# Patient Record
Sex: Female | Born: 1970
Health system: Southern US, Community
[De-identification: ages and names within clinical notes are randomized; demographics above are authoritative.]

## PROBLEM LIST (undated history)

## (undated) DIAGNOSIS — F419 Anxiety disorder, unspecified: Secondary | ICD-10-CM

## (undated) DIAGNOSIS — F329 Major depressive disorder, single episode, unspecified: Secondary | ICD-10-CM

## (undated) DIAGNOSIS — M170 Bilateral primary osteoarthritis of knee: Secondary | ICD-10-CM

## (undated) DIAGNOSIS — F32A Depression, unspecified: Secondary | ICD-10-CM

## (undated) DIAGNOSIS — G43909 Migraine, unspecified, not intractable, without status migrainosus: Secondary | ICD-10-CM

## (undated) DIAGNOSIS — G44009 Cluster headache syndrome, unspecified, not intractable: Secondary | ICD-10-CM

## (undated) HISTORY — DX: Migraine, unspecified, not intractable, without status migrainosus: G43.909

## (undated) HISTORY — DX: Major depressive disorder, single episode, unspecified: F32.9

## (undated) HISTORY — DX: Bilateral primary osteoarthritis of knee: M17.0

## (undated) HISTORY — DX: Depression, unspecified: F32.A

## (undated) HISTORY — DX: Anxiety disorder, unspecified: F41.9

## (undated) HISTORY — DX: Cluster headache syndrome, unspecified, not intractable: G44.009

## (undated) HISTORY — PX: LAPAROSCOPIC GASTRIC SLEEVE RESECTION: SHX5895

---

## 1997-04-22 ENCOUNTER — Other Ambulatory Visit: Admission: RE | Admit: 1997-04-22 | Discharge: 1997-04-22 | Payer: Self-pay | Admitting: Obstetrics and Gynecology

## 1998-05-02 ENCOUNTER — Other Ambulatory Visit: Admission: RE | Admit: 1998-05-02 | Discharge: 1998-05-02 | Payer: Self-pay | Admitting: Obstetrics and Gynecology

## 1999-06-05 ENCOUNTER — Other Ambulatory Visit: Admission: RE | Admit: 1999-06-05 | Discharge: 1999-06-05 | Payer: Self-pay | Admitting: Obstetrics and Gynecology

## 2000-07-02 ENCOUNTER — Other Ambulatory Visit: Admission: RE | Admit: 2000-07-02 | Discharge: 2000-07-02 | Payer: Self-pay | Admitting: Obstetrics and Gynecology

## 2001-04-02 ENCOUNTER — Ambulatory Visit (HOSPITAL_COMMUNITY): Admission: RE | Admit: 2001-04-02 | Discharge: 2001-04-02 | Payer: Self-pay

## 2001-04-02 ENCOUNTER — Encounter: Payer: Self-pay | Admitting: Obstetrics and Gynecology

## 2001-08-23 ENCOUNTER — Inpatient Hospital Stay (HOSPITAL_COMMUNITY): Admission: AD | Admit: 2001-08-23 | Discharge: 2001-08-23 | Payer: Self-pay | Admitting: Obstetrics and Gynecology

## 2001-08-25 ENCOUNTER — Inpatient Hospital Stay (HOSPITAL_COMMUNITY): Admission: AD | Admit: 2001-08-25 | Discharge: 2001-08-29 | Payer: Self-pay | Admitting: Obstetrics and Gynecology

## 2001-08-25 ENCOUNTER — Encounter (INDEPENDENT_AMBULATORY_CARE_PROVIDER_SITE_OTHER): Payer: Self-pay | Admitting: Specialist

## 2001-08-30 ENCOUNTER — Encounter: Admission: RE | Admit: 2001-08-30 | Discharge: 2001-09-29 | Payer: Self-pay | Admitting: Obstetrics and Gynecology

## 2001-09-30 ENCOUNTER — Encounter: Admission: RE | Admit: 2001-09-30 | Discharge: 2001-10-30 | Payer: Self-pay | Admitting: Obstetrics and Gynecology

## 2001-10-10 ENCOUNTER — Other Ambulatory Visit: Admission: RE | Admit: 2001-10-10 | Discharge: 2001-10-10 | Payer: Self-pay | Admitting: Obstetrics and Gynecology

## 2002-08-12 ENCOUNTER — Encounter: Payer: Self-pay | Admitting: Family Medicine

## 2002-08-12 ENCOUNTER — Encounter: Admission: RE | Admit: 2002-08-12 | Discharge: 2002-08-12 | Payer: Self-pay | Admitting: Family Medicine

## 2002-11-11 ENCOUNTER — Other Ambulatory Visit: Admission: RE | Admit: 2002-11-11 | Discharge: 2002-11-11 | Payer: Self-pay | Admitting: Obstetrics and Gynecology

## 2003-11-16 ENCOUNTER — Other Ambulatory Visit: Admission: RE | Admit: 2003-11-16 | Discharge: 2003-11-16 | Payer: Self-pay | Admitting: Obstetrics and Gynecology

## 2004-12-18 ENCOUNTER — Other Ambulatory Visit: Admission: RE | Admit: 2004-12-18 | Discharge: 2004-12-18 | Payer: Self-pay | Admitting: Obstetrics and Gynecology

## 2005-08-01 ENCOUNTER — Emergency Department (HOSPITAL_COMMUNITY): Admission: EM | Admit: 2005-08-01 | Discharge: 2005-08-01 | Payer: Self-pay | Admitting: Family Medicine

## 2006-05-16 ENCOUNTER — Encounter (INDEPENDENT_AMBULATORY_CARE_PROVIDER_SITE_OTHER): Payer: Self-pay | Admitting: Specialist

## 2006-05-16 ENCOUNTER — Inpatient Hospital Stay (HOSPITAL_COMMUNITY): Admission: AD | Admit: 2006-05-16 | Discharge: 2006-05-19 | Payer: Self-pay | Admitting: Obstetrics and Gynecology

## 2006-05-20 ENCOUNTER — Encounter: Admission: RE | Admit: 2006-05-20 | Discharge: 2006-06-14 | Payer: Self-pay | Admitting: Obstetrics and Gynecology

## 2009-04-22 ENCOUNTER — Encounter: Admission: RE | Admit: 2009-04-22 | Discharge: 2009-04-22 | Payer: Self-pay | Admitting: Family Medicine

## 2010-05-30 ENCOUNTER — Encounter: Payer: Self-pay | Admitting: Oncology

## 2010-06-16 NOTE — Op Note (Signed)
NAMELORYN, Laura Brown               ACCOUNT NO.:  [id number]   MEDICAL RECORD NO.:  [id number]          PATIENT TYPE:  AMB   LOCATION:  SDC                           FACILITY:  [facility]   PHYSICIAN:  Michelle L. Grewal, M.D.DATE OF BIRTH:  [birth year]   DATE OF PROCEDURE:  05/16/2006  DATE OF DISCHARGE:                               OPERATIVE REPORT   PREOPERATIVE DIAGNOSES:  1. Intrauterine pregnancy at term.  2. Previous C-section.  3. Desires permanent sterilization.   POSTOPERATIVE DIAGNOSES:  1. Intrauterine pregnancy at term.  2. Previous C-section.  3. Desires permanent sterilization.   PROCEDURE:  1. Repeat low transverse cesarean section.  2. Modified Pomeroy bilateral tubal ligation.   SURGEON:  Dr. Vincente Poli.   ASSISTANT:  Dr. Arelia Sneddon.   ANESTHESIA:  Spinal.   SPECIMENS:  Female in cephalic presentation, Apgars 9 at one minute and  9 at five minutes.   ESTIMATED BLOOD LOSS:  500 mL.   COMPLICATIONS:  None.   PROCEDURE:  Patient was taken to the operating room.  Her spinal was  placed by Dr. Arby Barrette.  She was then prepped and draped.  A Foley  catheter was inserted.  The patient was tested with Allis, and she was  none.  A low transverse incision was made in the area of the previous C-  section, carried down to fascia.  Fascia scored in the midline and  extended laterally.  The rectus muscles were separated in the midline.  The peritoneum was entered bluntly.  Peritoneal incision was then  stretched.  The bladder blade was inserted.  The lower uterine segment  was identified, and the bladder flap was created sharply and then  digitally.  The bladder blade was readjusted.  A low transverse incision  was made in the uterus.  The uterus was entered using hemostat.  Amniotic fluid was clear.  The baby was cephalic presentation and was  delivered easily with one gentle pull of the vacuum and no pop-off.  The  baby was a female infant with Apgars of 9 at one minute and 9  at five  minutes.  Cord was clamped.  The baby was handed to the waiting neonatal  team and subsequently taken to newborn nursery.  Cord pH was obtained.  Cord blood was obtained.  The placenta was manually removed and noted be  normal, intact with three-vessel cord.  The uterus was exteriorized and  cleared of all clots and debris.  The adnexa were normal.  The uterine  incision was closed using one layer of 0 chromic continuous running  locked stitch.  Antibiotics and Pitocin were given.  The uterus was very  firm.  At this point, we performed a modified Pomeroy bilateral tubal  ligation in standard fashion.  Bilateral fimbriated ends were noted.  Both tubal segments were sent separately for pathology confirmation.  The uterus was returned to the abdomen.  Irrigation was performed.  Hemostasis was excellent.  The peritoneum was reapproximated using 0  Vicryl, and the fascia was closed using a 0 Vicryl running stitch.  After irrigation of the subcutaneous layer,  the subcutaneous layer was  closed using interrupts using plain gut.  The skin was closed with  staples.  All sponge, lap and instrument counts were correct x2      Michelle L. Vincente Poli, M.D.  Electronically Signed     MLG/MEDQ  D:  05/16/2006  T:  05/16/2006  Job:  [id number]

## 2010-06-16 NOTE — Op Note (Signed)
NAME:  Laura Brown, Laura Brown                         ACCOUNT NO.:  [id number]   MEDICAL RECORD NO.:  [id number]                   PATIENT TYPE:  INP   LOCATION:  9116                                 FACILITY:  [facility]   PHYSICIAN:  Juluis Mire, M.D.                DATE OF BIRTH:  1970/08/06   DATE OF PROCEDURE:  DATE OF DISCHARGE:                                 OPERATIVE REPORT   PREOPERATIVE DIAGNOSES:  1. Intrauterine pregnancy at 40+ weeks.  2. Induction of labor.  3. Nonreassuring fetal heart rate.   POSTOPERATIVE DIAGNOSES:  1. Intrauterine pregnancy at 40+ weeks.  2. Induction of labor.  3. Nonreassuring fetal heart rate.  4. Finding of meconium stained fluid.  5. Nuchal cord x1.   OPERATIVE PROCEDURE:  Low transverse cesarean section.   SURGEON:  Juluis Mire, M.D.   ANESTHESIA:  Epidural.   ESTIMATED BLOOD LOSS:  800 cc.   PACKS AND DRAINS:  None.   COMPLICATIONS:  None.   INDICATIONS FOR PROCEDURE:  The patient is a 40 year old, primigravida,  married, white female who presents at 40+ weeks for Cytotec, ripening of  cervix and induction of labor.  Through the evening after the Cytotec was  applied, we began noticing repetitive decelerations down at times into the  60s, some with slow recovery.  In between, would be a fairly reactive  tracing.  This pattern continued despite position changes, hydration and  oxygen.  The cervix remained long and fingertip.  The concern was that with  progressive induction of labor, the fetal tracing would worsen.  After  discussions of options including the possibility of proceeding with  induction versus primary cesarean section, the decision was to proceed with  primary cesarean section in view of the preventing any potential fetal  compromise.  The risks were discussed including the risk of anesthesia, the  risk of infection, the risk of hemorrhage could necessitate transfusional  risk for AIDS or hepatitis, the risk of injury to  attached organs including  bladder, bowel and ureters.  She could require further exploratory surgery,  risk of deep venous thrombosis and pulmonary embolus.   DESCRIPTION OF PROCEDURE:  The patient was taken to the OR and placed in the  supine position with lateral tilt.  After epidural anesthesia was obtained,  the abdomen was prepped with Betadine and draped in a sterile field.  A low  transverse skin incision was made with the knife and carried through the  subcutaneous tissue.  The fascia was then sharply incised laterally.  The  fascia was taken off the muscle superiorly and inferiorly.  Rectus muscle  was resected at the midline.  The peritoneum was entered and incision of the  peritoneum extended superiorly and inferiorly.  Meconium stained fluid was  noted.  The infant was delivered with elevation of the head.  We  subsequently suctioned with DeLee was well as  bulb.  The infant was passed  off to awaiting pediatric team.  The infant was a viable female.  He weighed 7  pounds 13 ounces, Apgar's 8 and 9 and pH was 7.28.  There was a nuchal cord  x1.  Placenta was delivered manually and it was noted to be meconium stained  and sent for pathological review.  Uterus was then closed in interlocking  sutures with two-layer closure technique.  Hemostasis was excellent.  The  uterus was exteriorized.  Tubes and ovaries unremarkable.  There was a  serosal fibroid in the fundus that measured approximately 2-3 cm in greatest  dimension.  At this point and time, we thoroughly irrigated the pelvis.  Hemostasis was excellent.  Urine output remained cleared and adequate.  The  muscles were reapproximated with a running suture of 3-0 Vicryl.  The fascia  was closed with a running suture of 0 PDS.  Subcutaneous was closed with 3-0  Vicryl.  The skin was closed with staples and Steri-Strips.  Sponge and  instrument count correct by nurse x2.  Foley catheter remained clear at the  time of closure.  The  patient did tolerate the procedure well and was  returned to the recovery room in good condition.                                                  Juluis Mire, M.D.    JSM/MEDQ  D:  08/26/2001  T:  08/27/2001  Job:  [id number]

## 2010-06-16 NOTE — Discharge Summary (Signed)
Laura Brown, Laura Brown                         ACCOUNT NO.:  [id number]   MEDICAL RECORD NO.:  [id number]                   PATIENT TYPE:  INP   LOCATION:  9116                                 FACILITY:  [facility]   PHYSICIAN:  Guy Sandifer. Henderson Cloud, M.D.              DATE OF BIRTH:  12-08-1970   DATE OF ADMISSION:  08/26/2001  DATE OF DISCHARGE:  08/29/2001                                 DISCHARGE SUMMARY   ADMITTING DIAGNOSES:  1. Intrauterine pregnancy at 40+ weeks estimated gestational age.  2. Induction of labor.  3. Nonreassuring fetal heart pattern.  4. Meconium stained fluid.   DISCHARGE DIAGNOSES:  1. Status post low transverse cesarean delivery.  2. Viable female infant.   PROCEDURE:  Primary low transverse cesarean section.   REASON FOR ADMISSION:  Please see written H&P.   HOSPITAL COURSE:  The patient was admitted for an induction of labor with  Cytotec for post date induction and cervical ripening.  After Cytotec was  applied fetal heart tones were noted to have repetitive decelerations with  some slow recovery.  Position was changed.  The patient was hydrated and  oxygen was supplied without significant fetal heart rate pattern change.  Cervix remained long and fingertip dilated.  Decision was made to progress  with a primary cesarean delivery.  Epidural anesthesia was administered to  an adequate surgical level.  A low transverse incision was made with the  delivery of a viable female infant weighing 7 pounds 13 ounces with Apgars of  8 at one minute and 9 at five minutes.  The patient tolerated the procedure  well and was transferred to the recovery room in stable condition.  On  postoperative day one patient had good return of bowel function.  Abdomen  was soft.  Abdominal dressing was partially removed and incision was noted  to be clean, dry, and intact.  Laboratories revealed hemoglobin 12.1,  platelet count 234,000, WBC 12.5.  On postoperative day two patient  complained of abdominal soreness.  Slight erythema was noted at the mid  point of the incision 2 cm superior to the incisional site.  The patient was  ambulating well, tolerating a regular diet without complaints of nausea and  vomiting.  On postoperative day three incision was clean, dry, and intact.  Erythema was resolved.  The patient was discharged home.   CONDITION ON DISCHARGE:  Good.   DIET:  Regular, as tolerated.   ACTIVITY:  No heavy lifting.  No vaginal entry.  No driving x2 weeks.   FOLLOW UP:  The patient is to follow up in the office in one to two weeks  for an incision check.  She should call for temperature greater than 100  degrees, persistent nausea and vomiting, heavy vaginal bleeding, and/or  redness or drainage from the incision site.    DISCHARGE MEDICATIONS:  1. Prenatal vitamins one p.o. daily.  2.  Percocet as directed.     Judith Blonder, N.P.                     Guy Sandifer Henderson Cloud, M.D.    CGC/MEDQ  D:  09/23/2001  T:  09/23/2001  Job:  [id number]

## 2010-06-16 NOTE — Discharge Summary (Signed)
[name], Laura Brown NO.:  [id number]   MEDICAL RECORD NO.:  [id number]          PATIENT TYPE:  INP   LOCATION:  9105                          FACILITY:  [facility]   PHYSICIAN:  Zelphia Cairo, MD    DATE OF BIRTH:  03/23/1970   DATE OF ADMISSION:  05/16/2006  DATE OF DISCHARGE:  05/19/2006                               DISCHARGE SUMMARY   ADMITTING DIAGNOSES:  1. Intrauterine pregnancy at term.  2. Previous cesarean section.  3. Multiparity, desires permanent sterilization.   DISCHARGE DIAGNOSES:  1. Status post low transverse cesarean section.  2. Viable female infant.  3. Permanent sterilization.   REASON FOR ADMISSION:  Please see written H&P.   HOSPITAL COURSE:  The patient was a 40 year old married female, gravida  2, para 1, that was admitted to [facility] for  scheduled cesarean section.  The patient had had a previous cesarean  delivery and desired repeat.  Due to multiparity, the patient had also  requested permanent sterilization.  On the morning of admission, the  patient was taken to operating room where spinal anesthesia was  administered without difficulty.  Low transverse incision was made with  delivery of a viable female infant weighing 7 pounds 4 ounces, Apgars of  9 at one and 9 at five minutes.  Arterial cord pH of 7.29.  The patient  tolerated the procedure well and was taken to the recovery room in  stable condition.  One postoperative day #1, the patient was without  complaint.  Vital signs were stable.  She is afebrile.  Abdomen soft  with good return of bowel function.  Fundus was firm and nontender.  Abdominal dressing was noted to have a scant amount of old drainage  noted on the bandage.  She was ambulating well.  Foley had been  discontinued.  Laboratory findings showed hemoglobin of 11.0, platelet  count of 187,000, WBC count of 10.6.  Blood type was noted to be B+.  On  postoperative day #2, the patient was  without complaint.  Vital signs  were stable.  She was afebrile.  Abdomen soft with good return of bowel  function.  Abdominal dressing had been removed revealing an incision  that was clean, dry and intact.  On postoperative day #3, the patient  was without complaint.  Vital signs remained stable.  She was afebrile.  Abdomen soft.  Fundus firm and nontender.  Incision was clean, dry and  intact.  Staples were removed, and the patient was later discharged  home.   CONDITION ON DISCHARGE:  Good.   DIET:  Regular as tolerated.   ACTIVITY:  No heavy lifting, no driving x2 weeks, no vaginal entry.   FOLLOW UP:  Patient to follow up in the office in 1-2 weeks for an  incision check.  She is to call for a temperature greater than 100  degrees, persistent nausea, vomiting, heavy vaginal bleeding and/or  redness or drainage from incisional site.   DISCHARGE MEDICATIONS:  1. Percocet 5/325 #30 1 p.o. every 4 to 6 hours p.r.n.  2. Motrin 600 mg  every 6 hours.  3. Prenatal vitamins 1 p.o. daily.  4. Colace 1 p.o. daily p.r.n.      Julio Sicks, N.P.      Zelphia Cairo, MD  Electronically Signed    CC/MEDQ  D:  06/21/2006  T:  06/21/2006  Job:  [id number]

## 2010-08-29 ENCOUNTER — Other Ambulatory Visit: Payer: Self-pay | Admitting: Family Medicine

## 2010-08-29 DIAGNOSIS — Z1231 Encounter for screening mammogram for malignant neoplasm of breast: Secondary | ICD-10-CM

## 2010-09-01 ENCOUNTER — Ambulatory Visit
Admission: RE | Admit: 2010-09-01 | Discharge: 2010-09-01 | Disposition: A | Discharge status: Home / Self Care | Payer: No Typology Code available for payment source | Source: Ambulatory Visit | Attending: Family Medicine | Admitting: Family Medicine

## 2010-09-01 DIAGNOSIS — Z1231 Encounter for screening mammogram for malignant neoplasm of breast: Secondary | ICD-10-CM

## 2011-03-22 ENCOUNTER — Ambulatory Visit: Payer: Self-pay | Admitting: Specialist

## 2011-03-22 LAB — PHOSPHORUS: Phosphorus: 3.5 mg/dL (ref 2.5–4.9)

## 2011-03-22 LAB — COMPREHENSIVE METABOLIC PANEL
Albumin: 3.6 g/dL (ref 3.4–5.0)
Alkaline Phosphatase: 54 U/L (ref 50–136)
Anion Gap: 10 (ref 7–16)
BUN: 14 mg/dL (ref 7–18)
Bilirubin,Total: 0.5 mg/dL (ref 0.2–1.0)
Calcium, Total: 8.7 mg/dL (ref 8.5–10.1)
Chloride: 107 mmol/L (ref 98–107)
Co2: 26 mmol/L (ref 21–32)
Creatinine: 0.73 mg/dL (ref 0.60–1.30)
EGFR (African American): >60
EGFR (Non-African Amer.): >60
Glucose: 77 mg/dL (ref 65–99)
Osmolality: 284 (ref 275–301)
Potassium: 3.9 mmol/L (ref 3.5–5.1)
SGOT(AST): 21 U/L (ref 15–37)
SGPT (ALT): 15 U/L
Sodium: 143 mmol/L (ref 136–145)
Total Protein: 7.4 g/dL (ref 6.4–8.2)

## 2011-03-22 LAB — CBC WITH DIFFERENTIAL/PLATELET
Basophil #: 0 10*3/uL (ref 0.0–0.1)
Basophil %: 0.6 %
Eosinophil #: 0.1 10*3/uL (ref 0.0–0.7)
Eosinophil %: 0.9 %
HCT: 43.7 % (ref 35.0–47.0)
HGB: 14.4 g/dL (ref 12.0–16.0)
Lymphocyte #: 2 10*3/uL (ref 1.0–3.6)
Lymphocyte %: 32.4 %
MCH: 30.5 pg (ref 26.0–34.0)
MCHC: 32.8 g/dL (ref 32.0–36.0)
MCV: 93 fL (ref 80–100)
Monocyte #: 0.3 10*3/uL (ref 0.0–0.7)
Monocyte %: 4.5 %
Neutrophil #: 3.9 10*3/uL (ref 1.4–6.5)
Neutrophil %: 61.6 %
Platelet: 277 10*3/uL (ref 150–440)
RBC: 4.7 10*6/uL (ref 3.80–5.20)
RDW: 11.3 % — ABNORMAL LOW (ref 11.5–14.5)
WBC: 6.3 10*3/uL (ref 3.6–11.0)

## 2011-03-22 LAB — AMYLASE: Amylase: 26 U/L (ref 25–115)

## 2011-03-22 LAB — PROTIME-INR
INR: 0.9
Prothrombin Time: 12.8 secs (ref 11.5–14.7)

## 2011-03-22 LAB — HEPATIC FUNCTION PANEL A (ARMC): Bilirubin, Direct: 0.1 mg/dL (ref 0.00–0.20)

## 2011-03-22 LAB — FERRITIN: Ferritin (ARMC): 32 ng/mL (ref 8–388)

## 2011-03-22 LAB — APTT: Activated PTT: 31.5 secs (ref 23.6–35.9)

## 2011-03-22 LAB — TSH: Thyroid Stimulating Horm: 1.66 u[IU]/mL

## 2011-03-22 LAB — LIPASE, BLOOD: Lipase: 97 U/L (ref 73–393)

## 2011-03-22 LAB — MAGNESIUM: Magnesium: 1.8 mg/dL

## 2011-10-26 ENCOUNTER — Other Ambulatory Visit: Payer: Self-pay | Admitting: Family Medicine

## 2011-10-26 DIAGNOSIS — Z1231 Encounter for screening mammogram for malignant neoplasm of breast: Secondary | ICD-10-CM

## 2011-11-30 ENCOUNTER — Ambulatory Visit: Payer: No Typology Code available for payment source

## 2011-12-14 ENCOUNTER — Ambulatory Visit: Payer: No Typology Code available for payment source

## 2012-04-17 ENCOUNTER — Ambulatory Visit: Payer: No Typology Code available for payment source

## 2012-04-29 ENCOUNTER — Ambulatory Visit: Payer: No Typology Code available for payment source

## 2012-05-05 ENCOUNTER — Ambulatory Visit: Payer: No Typology Code available for payment source

## 2012-06-12 ENCOUNTER — Ambulatory Visit
Admission: RE | Admit: 2012-06-12 | Discharge: 2012-06-12 | Disposition: A | Discharge status: Home / Self Care | Payer: No Typology Code available for payment source | Source: Ambulatory Visit | Attending: Family Medicine | Admitting: Family Medicine

## 2012-06-12 DIAGNOSIS — Z1231 Encounter for screening mammogram for malignant neoplasm of breast: Secondary | ICD-10-CM

## 2014-04-20 ENCOUNTER — Ambulatory Visit (INDEPENDENT_AMBULATORY_CARE_PROVIDER_SITE_OTHER): Payer: 59 | Admitting: Sports Medicine

## 2014-04-20 ENCOUNTER — Encounter: Payer: Self-pay | Admitting: Sports Medicine

## 2014-04-20 ENCOUNTER — Ambulatory Visit (INDEPENDENT_AMBULATORY_CARE_PROVIDER_SITE_OTHER): Payer: 59

## 2014-04-20 VITALS — BP 136/84 | HR 107 | Ht 67.0 in | Wt 285.0 lb

## 2014-04-20 DIAGNOSIS — M8580 Other specified disorders of bone density and structure, unspecified site: Secondary | ICD-10-CM | POA: Diagnosis not present

## 2014-04-20 DIAGNOSIS — M545 Low back pain, unspecified: Secondary | ICD-10-CM | POA: Insufficient documentation

## 2014-04-20 DIAGNOSIS — M170 Bilateral primary osteoarthritis of knee: Secondary | ICD-10-CM

## 2014-04-20 DIAGNOSIS — M5136 Other intervertebral disc degeneration, lumbar region: Secondary | ICD-10-CM | POA: Diagnosis not present

## 2014-04-20 MED ORDER — MELOXICAM 15 MG PO TABS: ORAL_TABLET | ORAL | Status: DC

## 2014-04-20 NOTE — Progress Notes (Signed)
   Subjective:    I'm seeing this patient as a consultation for:  Dr. Duanne Guessewey  CC: bilateral leg pain  HPI: This is a pleasant 44 year old female, for the past 6 months she's had worsening pain in the posterior aspect of her knees and calves, worse when sitting for long periods of time and in the morning with significant stiffness that improves with movement. Symptoms are moderate, persistent and she is having significant swelling.  Past medical history, Surgical history, Family history not pertinant except as noted below, Social history, Allergies, and medications have been entered into the medical record, reviewed, and no changes needed.   Review of Systems: No headache, visual changes, nausea, vomiting, diarrhea, constipation, dizziness, abdominal pain, skin rash, fevers, chills, night sweats, weight loss, swollen lymph nodes, body aches, joint swelling, muscle aches, chest pain, shortness of breath, mood changes, visual or auditory hallucinations.   Objective:   General: Well Developed, well nourished, and in no acute distress.  Neuro/Psych: Alert and oriented x3, extra-ocular muscles intact, able to move all 4 extremities, sensation grossly intact. Skin: Warm and dry, no rashes noted.  Respiratory: Not using accessory muscles, speaking in full sentences, trachea midline.  Cardiovascular: Pulses palpable, no extremity edema. Abdomen: Does not appear distended. Bilateral Knee: Tender to palpation at the medial joint line bilaterally with a palpable fluid wave. ROM normal in flexion and extension and lower leg rotation. Ligaments with solid consistent endpoints including ACL, PCL, LCL, MCL. Negative Mcmurray's and provocative meniscal tests. Non painful patellar compression. Patellar and quadriceps tendons unremarkable. Hamstring and quadriceps strength is normal.  Procedure: Real-time Ultrasound Guided Injection of left knee Device: GE Logiq E  Verbal informed consent obtained.    Time-out conducted.  Noted no overlying erythema, induration, or other signs of local infection.  Skin prepped in a sterile fashion.  Local anesthesia: Topical Ethyl chloride.  With sterile technique and under real time ultrasound guidance:  2 mL kenalog 40, 4 mL lidocaine injected easily. Completed without difficulty  Pain immediately resolved suggesting accurate placement of the medication.  Advised to call if fevers/chills, erythema, induration, drainage, or persistent bleeding.  Images permanently stored and available for review in the ultrasound unit.  Impression: Technically successful ultrasound guided injection.  Procedure: Real-time Ultrasound Guided Injection of right knee Device: GE Logiq E  Verbal informed consent obtained.  Time-out conducted.  Noted no overlying erythema, induration, or other signs of local infection.  Skin prepped in a sterile fashion.  Local anesthesia: Topical Ethyl chloride.  With sterile technique and under real time ultrasound guidance:  2 mL kenalog 40, 4 mL lidocaine injected easily. Completed without difficulty  Pain immediately resolved suggesting accurate placement of the medication.  Advised to call if fevers/chills, erythema, induration, drainage, or persistent bleeding.  Images permanently stored and available for review in the ultrasound unit.  Impression: Technically successful ultrasound guided injection.  Impression and Recommendations:   This case required medical decision making of moderate complexity.

## 2014-04-20 NOTE — Assessment & Plan Note (Signed)
X-rays, meloxicam, return for custom orthotics. Bilateral injection as above.

## 2014-04-20 NOTE — Assessment & Plan Note (Signed)
She was having some posterior calf pain, with radiation to the back. I think it's coming from her knees however we are also going to image her lumbar spine.

## 2014-04-28 ENCOUNTER — Ambulatory Visit: Payer: No Typology Code available for payment source | Admitting: Family Medicine

## 2014-05-03 ENCOUNTER — Encounter: Payer: No Typology Code available for payment source | Admitting: Sports Medicine

## 2014-09-23 ENCOUNTER — Ambulatory Visit (INDEPENDENT_AMBULATORY_CARE_PROVIDER_SITE_OTHER): Payer: 59 | Admitting: Sports Medicine

## 2014-09-23 ENCOUNTER — Encounter: Payer: Self-pay | Admitting: Sports Medicine

## 2014-09-23 DIAGNOSIS — M170 Bilateral primary osteoarthritis of knee: Secondary | ICD-10-CM

## 2014-09-23 MED ORDER — HYDROCODONE-ACETAMINOPHEN 5-325 MG PO TABS: 1.0000 | ORAL_TABLET | Freq: Three times a day (TID) | ORAL | Status: DC | PRN

## 2014-09-23 NOTE — Assessment & Plan Note (Signed)
Did extremely well after bilateral knee injection 5 months ago. Now having recurrent pain in the right knee, anterolateral joint line with pain on flexion, suggestive of a degenerative meniscal tear. Repeat injection, MRI. Return to go over MRI results.

## 2014-09-23 NOTE — Progress Notes (Signed)
  Subjective:    CC: Follow-up  HPI: This is a pleasant 44 year old female with known bilateral knee osteoarthritis, I injected her approximately 5-1/2 months ago, she did extremely well until last week, she went to the beach, twisted, and had a recurrence of pain, with minimal swelling at the lateral joint line of the right knee, pain is severe, persistent, worse with bending her knee, no mechanical symptoms, clicking, catching, popping, locking, or buckling. No constitutional symptoms.  Past medical history, Surgical history, Family history not pertinant except as noted below, Social history, Allergies, and medications have been entered into the medical record, reviewed, and no changes needed.   Review of Systems: No fevers, chills, night sweats, weight loss, chest pain, or shortness of breath.   Objective:    General: Well Developed, well nourished, and in no acute distress.  Neuro: Alert and oriented x3, extra-ocular muscles intact, sensation grossly intact.  HEENT: Normocephalic, atraumatic, pupils equal round reactive to light, neck supple, no masses, no lymphadenopathy, thyroid nonpalpable.  Skin: Warm and dry, no rashes. Cardiac: Regular rate and rhythm, no murmurs rubs or gallops, no lower extremity edema.  Respiratory: Clear to auscultation bilaterally. Not using accessory muscles, speaking in full sentences. Right knee: Tender to palpation at the anterolateral joint line and reproduction of pain with terminal flexion. Negative McMurray's.  Procedure: Real-time Ultrasound Guided Injection of right knee Device: GE Logiq E  Verbal informed consent obtained.  Time-out conducted.  Noted no overlying erythema, induration, or other signs of local infection.  Skin prepped in a sterile fashion.  Local anesthesia: Topical Ethyl chloride.  With sterile technique and under real time ultrasound guidance:  2 mL kenalog 40, 4 mL lidocaine injected easily into the suprapatellar  recess. Completed without difficulty  Pain immediately resolved suggesting accurate placement of the medication.  Advised to call if fevers/chills, erythema, induration, drainage, or persistent bleeding.  Images permanently stored and available for review in the ultrasound unit.  Impression: Technically successful ultrasound guided injection.  Impression and Recommendations:

## 2015-01-27 ENCOUNTER — Other Ambulatory Visit: Payer: Self-pay | Admitting: Family Medicine

## 2015-01-27 DIAGNOSIS — Z1231 Encounter for screening mammogram for malignant neoplasm of breast: Secondary | ICD-10-CM

## 2015-02-10 ENCOUNTER — Other Ambulatory Visit: Payer: Self-pay | Admitting: Family Medicine

## 2015-02-10 ENCOUNTER — Ambulatory Visit
Admission: RE | Admit: 2015-02-10 | Discharge: 2015-02-10 | Disposition: A | Discharge status: Home / Self Care | Payer: BLUE CROSS/BLUE SHIELD | Source: Ambulatory Visit | Attending: Family Medicine | Admitting: Family Medicine

## 2015-02-10 DIAGNOSIS — M1990 Unspecified osteoarthritis, unspecified site: Secondary | ICD-10-CM

## 2015-02-10 DIAGNOSIS — Z1231 Encounter for screening mammogram for malignant neoplasm of breast: Secondary | ICD-10-CM

## 2016-03-06 ENCOUNTER — Other Ambulatory Visit: Payer: Self-pay | Admitting: Family Medicine

## 2016-03-06 DIAGNOSIS — Z1231 Encounter for screening mammogram for malignant neoplasm of breast: Secondary | ICD-10-CM

## 2016-03-19 ENCOUNTER — Ambulatory Visit: Payer: BLUE CROSS/BLUE SHIELD

## 2016-04-03 ENCOUNTER — Ambulatory Visit
Admission: RE | Admit: 2016-04-03 | Discharge: 2016-04-03 | Disposition: A | Discharge status: Home / Self Care | Payer: BLUE CROSS/BLUE SHIELD | Source: Ambulatory Visit | Attending: Family Medicine | Admitting: Family Medicine

## 2016-04-03 DIAGNOSIS — Z1231 Encounter for screening mammogram for malignant neoplasm of breast: Secondary | ICD-10-CM

## 2017-04-18 ENCOUNTER — Other Ambulatory Visit: Payer: Self-pay | Admitting: Family Medicine

## 2017-04-18 DIAGNOSIS — Z1231 Encounter for screening mammogram for malignant neoplasm of breast: Secondary | ICD-10-CM

## 2017-05-14 ENCOUNTER — Ambulatory Visit: Payer: BLUE CROSS/BLUE SHIELD

## 2017-10-10 MED FILL — DULoxetine HCL 60 MG CPEP: 60 | 90 days supply | Qty: 180 | Fill #0

## 2017-10-10 MED FILL — BUTALBITAL/ACETAMINOPHEN 50: 50-325 | 5 days supply | Qty: 30 | Fill #0

## 2017-10-23 DIAGNOSIS — M170 Bilateral primary osteoarthritis of knee: Secondary | ICD-10-CM | POA: Diagnosis not present

## 2017-10-24 ENCOUNTER — Encounter (INDEPENDENT_AMBULATORY_CARE_PROVIDER_SITE_OTHER): Payer: Self-pay

## 2017-10-28 MED FILL — VERAPAMIL ER PM 100 MG CAP: 100 | 90 days supply | Qty: 180 | Fill #0

## 2017-10-29 DIAGNOSIS — M170 Bilateral primary osteoarthritis of knee: Secondary | ICD-10-CM | POA: Diagnosis not present

## 2017-11-02 DIAGNOSIS — J019 Acute sinusitis, unspecified: Secondary | ICD-10-CM | POA: Diagnosis not present

## 2017-11-05 DIAGNOSIS — M170 Bilateral primary osteoarthritis of knee: Secondary | ICD-10-CM | POA: Diagnosis not present

## 2017-11-05 DIAGNOSIS — Z6841 Body Mass Index (BMI) 40.0 and over, adult: Secondary | ICD-10-CM | POA: Diagnosis not present

## 2017-11-05 DIAGNOSIS — J3089 Other allergic rhinitis: Secondary | ICD-10-CM | POA: Diagnosis not present

## 2017-11-05 MED FILL — AZELASTINE HCL 137 MCG/SPRA: 137 | 25 days supply | Qty: 30 | Fill #0

## 2017-11-07 ENCOUNTER — Encounter (INDEPENDENT_AMBULATORY_CARE_PROVIDER_SITE_OTHER): Payer: Self-pay | Admitting: Family Medicine

## 2017-11-07 ENCOUNTER — Ambulatory Visit (INDEPENDENT_AMBULATORY_CARE_PROVIDER_SITE_OTHER): Payer: 59 | Admitting: Family Medicine

## 2017-11-07 VITALS — BP 115/75 | HR 61 | Temp 98.2°F | Ht 66.0 in | Wt 296.0 lb

## 2017-11-07 DIAGNOSIS — Z9189 Other specified personal risk factors, not elsewhere classified: Secondary | ICD-10-CM

## 2017-11-07 DIAGNOSIS — Z1331 Encounter for screening for depression: Secondary | ICD-10-CM | POA: Diagnosis not present

## 2017-11-07 DIAGNOSIS — R0602 Shortness of breath: Secondary | ICD-10-CM | POA: Diagnosis not present

## 2017-11-07 DIAGNOSIS — Z6841 Body Mass Index (BMI) 40.0 and over, adult: Secondary | ICD-10-CM

## 2017-11-07 DIAGNOSIS — R5383 Other fatigue: Secondary | ICD-10-CM

## 2017-11-07 DIAGNOSIS — Z0289 Encounter for other administrative examinations: Secondary | ICD-10-CM

## 2017-11-08 LAB — VITAMIN B12: Vitamin B-12: 493 pg/mL (ref 232–1245)

## 2017-11-08 LAB — CBC WITH DIFFERENTIAL
Basophils Absolute: 0 10*3/uL (ref 0.0–0.2)
Basos: 1 %
EOS (ABSOLUTE): 0.1 10*3/uL (ref 0.0–0.4)
Eos: 1 %
Hematocrit: 43.6 % (ref 34.0–46.6)
Hemoglobin: 14.8 g/dL (ref 11.1–15.9)
Immature Grans (Abs): 0 10*3/uL (ref 0.0–0.1)
Immature Granulocytes: 0 %
Lymphocytes Absolute: 2.4 10*3/uL (ref 0.7–3.1)
Lymphs: 35 %
MCH: 31.3 pg (ref 26.6–33.0)
MCHC: 33.9 g/dL (ref 31.5–35.7)
MCV: 92 fL (ref 79–97)
Monocytes Absolute: 0.5 10*3/uL (ref 0.1–0.9)
Monocytes: 7 %
Neutrophils Absolute: 3.9 10*3/uL (ref 1.4–7.0)
Neutrophils: 56 %
RBC: 4.73 x10E6/uL (ref 3.77–5.28)
RDW: 11.2 % — ABNORMAL LOW (ref 12.3–15.4)
WBC: 6.8 10*3/uL (ref 3.4–10.8)

## 2017-11-08 LAB — COMPREHENSIVE METABOLIC PANEL
ALT: 12 IU/L (ref 0–32)
AST: 13 IU/L (ref 0–40)
Albumin/Globulin Ratio: 1.4 (ref 1.2–2.2)
Albumin: 4.2 g/dL (ref 3.5–5.5)
Alkaline Phosphatase: 61 IU/L (ref 39–117)
BUN/Creatinine Ratio: 15 (ref 9–23)
BUN: 10 mg/dL (ref 6–24)
Bilirubin Total: 0.3 mg/dL (ref 0.0–1.2)
CO2: 26 mmol/L (ref 20–29)
Calcium: 9.1 mg/dL (ref 8.7–10.2)
Chloride: 100 mmol/L (ref 96–106)
Creatinine, Ser: 0.66 mg/dL (ref 0.57–1.00)
GFR calc Af Amer: 120 mL/min/{1.73_m2} (ref >59)
GFR calc non Af Amer: 104 mL/min/{1.73_m2} (ref >59)
Globulin, Total: 2.9 g/dL (ref 1.5–4.5)
Glucose: 68 mg/dL (ref 65–99)
Potassium: 4.5 mmol/L (ref 3.5–5.2)
Sodium: 140 mmol/L (ref 134–144)
Total Protein: 7.1 g/dL (ref 6.0–8.5)

## 2017-11-08 LAB — T3: T3, Total: 120 ng/dL (ref 71–180)

## 2017-11-08 LAB — HEMOGLOBIN A1C
Est. average glucose Bld gHb Est-mCnc: 94 mg/dL
Hgb A1c MFr Bld: 4.9 % (ref 4.8–5.6)

## 2017-11-08 LAB — TSH: TSH: 0.984 u[IU]/mL (ref 0.450–4.500)

## 2017-11-08 LAB — T4, FREE: Free T4: 1.05 ng/dL (ref 0.82–1.77)

## 2017-11-08 LAB — LIPID PANEL WITH LDL/HDL RATIO
Cholesterol, Total: 208 mg/dL — ABNORMAL HIGH (ref 100–199)
HDL: 63 mg/dL (ref >39)
LDL Calculated: 131 mg/dL — ABNORMAL HIGH (ref 0–99)
LDl/HDL Ratio: 2.1 ratio (ref 0.0–3.2)
Triglycerides: 71 mg/dL (ref 0–149)
VLDL Cholesterol Cal: 14 mg/dL (ref 5–40)

## 2017-11-08 LAB — VITAMIN D 25 HYDROXY (VIT D DEFICIENCY, FRACTURES): Vit D, 25-Hydroxy: 19.1 ng/mL — ABNORMAL LOW (ref 30.0–100.0)

## 2017-11-08 LAB — FOLATE: Folate: 6.4 ng/mL (ref >3.0)

## 2017-11-08 LAB — INSULIN, RANDOM: INSULIN: 6.7 u[IU]/mL (ref 2.6–24.9)
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
## 2017-11-20 ENCOUNTER — Ambulatory Visit (INDEPENDENT_AMBULATORY_CARE_PROVIDER_SITE_OTHER): Payer: 59 | Admitting: Psychology

## 2017-11-20 DIAGNOSIS — F330 Major depressive disorder, recurrent, mild: Secondary | ICD-10-CM | POA: Diagnosis not present

## 2017-11-21 ENCOUNTER — Ambulatory Visit (INDEPENDENT_AMBULATORY_CARE_PROVIDER_SITE_OTHER): Payer: 59 | Admitting: Family Medicine

## 2017-11-21 ENCOUNTER — Ambulatory Visit (INDEPENDENT_AMBULATORY_CARE_PROVIDER_SITE_OTHER): Payer: 59

## 2017-11-21 ENCOUNTER — Ambulatory Visit (INDEPENDENT_AMBULATORY_CARE_PROVIDER_SITE_OTHER): Payer: 59 | Admitting: Orthopedic Surgery

## 2017-11-21 ENCOUNTER — Encounter (INDEPENDENT_AMBULATORY_CARE_PROVIDER_SITE_OTHER): Payer: Self-pay | Admitting: Orthopedic Surgery

## 2017-11-21 VITALS — BP 135/81 | HR 83 | Temp 97.7°F | Ht 66.0 in | Wt 298.0 lb

## 2017-11-21 VITALS — Ht 66.0 in | Wt 298.0 lb

## 2017-11-21 DIAGNOSIS — E7849 Other hyperlipidemia: Secondary | ICD-10-CM | POA: Diagnosis not present

## 2017-11-21 DIAGNOSIS — Z6841 Body Mass Index (BMI) 40.0 and over, adult: Secondary | ICD-10-CM | POA: Diagnosis not present

## 2017-11-21 DIAGNOSIS — E559 Vitamin D deficiency, unspecified: Secondary | ICD-10-CM | POA: Diagnosis not present

## 2017-11-21 DIAGNOSIS — M2021 Hallux rigidus, right foot: Secondary | ICD-10-CM

## 2017-11-21 DIAGNOSIS — M6701 Short Achilles tendon (acquired), right ankle: Secondary | ICD-10-CM | POA: Diagnosis not present

## 2017-11-21 DIAGNOSIS — M79671 Pain in right foot: Secondary | ICD-10-CM | POA: Diagnosis not present

## 2017-11-21 DIAGNOSIS — F3289 Other specified depressive episodes: Secondary | ICD-10-CM

## 2017-11-21 DIAGNOSIS — Z9189 Other specified personal risk factors, not elsewhere classified: Secondary | ICD-10-CM | POA: Diagnosis not present

## 2017-11-21 MED ORDER — BUPROPION HCL ER (SR) 150 MG PO TB12: 150.0000 mg | ORAL_TABLET | Freq: Every day | ORAL | 0 refills | Status: DC

## 2017-11-21 MED ORDER — VITAMIN D (ERGOCALCIFEROL) 1.25 MG (50000 UNIT) PO CAPS: 50000.0000 [IU] | ORAL_CAPSULE | ORAL | 0 refills | Status: DC

## 2017-11-21 MED FILL — VIT D2 1.25 MG (50,000 UNIT: 1.25 MG | 28 days supply | Qty: 4 | Fill #0

## 2017-11-21 MED FILL — BUPROPION SR 150 MG TABLET: 150 | 30 days supply | Qty: 30 | Fill #0

## 2017-11-22 ENCOUNTER — Encounter (INDEPENDENT_AMBULATORY_CARE_PROVIDER_SITE_OTHER): Payer: Self-pay | Admitting: Orthopedic Surgery

## 2017-11-22 NOTE — Progress Notes (Signed)
Office Visit Note   Patient: Laura Brown           Date of Birth: Jul 30, 1970           MRN: [id number] Visit Date: 11/21/2017              Requested by: Lewis Moccasin, MD [address] [place], [place] [address] PCP: Lewis Moccasin, MD  Chief Complaint  Patient presents with  . Right Foot - Pain      HPI: Patient is a 47 year old woman who presents complaining of right forefoot pain for 2 weeks.  She states she has swelling of the dorsum of her foot difficulty to wear shoes she also complains of lateral's foot pain denies any specific injury or trauma.  She has pain with prolonged weightbearing.  Patient is used Tylenol elevation without relief.  Assessment & Plan: Visit Diagnoses:  1. Pain in right foot   2. Achilles tendon contracture, right   3. Hallux rigidus, right foot     Plan: With patient's Achilles contracture long second third and fourth metatarsal she is increasing stress across the forefoot.  Patient was given instructions for compression stockings for the venous insufficiency Achilles stretching was demonstrated she will do this 5 times a day a minute at a time recommended stiff soled Trail walking sneakers with over-the-counter orthotics with good arch support.  Follow-Up Instructions: Return in about 4 weeks (around 12/19/2017).   Ortho Exam  Patient is alert, oriented, no adenopathy, well-dressed, normal affect, normal respiratory effort. Examination patient has a good dorsalis pedis pulse she has good ankle good subtalar motion with her knee extended she has dorsiflexion just short of neutral.  She has tenderness to palpation beneath the second third and fourth metatarsal heads.  There is no webspace tenderness no evidence of a neuroma.  Patient has callus beneath the forefoot secondary to the overload stress.  Imaging: No results found. No images are attached to the encounter.  Labs: Lab Results  Component Value Date   HGBA1C 4.9  11/07/2017     Lab Results  Component Value Date   ALBUMIN 4.2 11/07/2017   ALBUMIN 3.6 03/22/2011    Body mass index is 48.1 kg/m.  Orders:  Orders Placed This Encounter  Procedures  . XR Foot 2 Views Right   No orders of the defined types were placed in this encounter.    Procedures: No procedures performed  Clinical Data: No additional findings.  ROS:  All other systems negative, except as noted in the HPI. Review of Systems  Objective: Vital Signs: Ht 5\' 6"  (1.676 m)   Wt 298 lb (135.2 kg)   LMP 10/31/2017 Comment: tubal ligation  BMI 48.10 kg/m   Specialty Comments:  No specialty comments available.  PMFS History: Patient Active Problem List   Diagnosis Date Noted  . Primary osteoarthritis of both knees 04/20/2014  . Low back pain 04/20/2014   Past Medical History:  Diagnosis Date  . Anxiety   . Cluster headaches   . Depression   . Migraines   . Osteoarthritis of both knees     Family History  Problem Relation Age of Onset  . Hyperlipidemia Mother   . Hyperlipidemia Father   . Hypertension Father     Past Surgical History:  Procedure Laterality Date  . CESAREAN SECTION     Social History   Occupational History  . Occupation: Clinical cytogeneticist: [employer]  Tobacco Use  . Smoking status: Never Smoker  . Smokeless tobacco: Never Used  Substance and Sexual Activity  . Alcohol use: Not on file  . Drug use: Not on file  . Sexual activity: Not on file

## 2017-11-25 NOTE — Progress Notes (Signed)
Office: [phone]  /  Fax: [phone]   HPI:   Chief Complaint: OBESITY Laura Brown is here to discuss her progress with her obesity treatment plan. She is on the  follow the Category 2 plan +100 calories and is following her eating plan approximately 50 % of the time. She states she is exercising 0 minutes 0 times per week. Laura Brown is currently struggling to follow plan especially for dinner. She notes increased hunger between 4-5 pm. She also injured her foot and this made meal prepping difficulty.   Her weight is 298 lb (135.2 kg) today and has not lost weight since her last visit. She has lost 0 lbs since starting treatment with [place].  Hyperlipidemia Laura Brown has a new diagnosis hyperlipidemia and has been trying to improve her cholesterol levels with intensive lifestyle modification including a low saturated fat diet, exercise and weight loss. She denies any chest pain, claudication or myalgias. She is not currently taking any medications.   Vitamin D deficiency Laura Brown has a new diagnosis of vitamin D deficiency. She is not currently taking vit D and denies nausea, vomiting or muscle weakness. She reports fatigue.   Ref. Range 11/07/2017 09:01  Vitamin D, 25-Hydroxy Latest Ref Range: 30.0 - 100.0 ng/mL 19.1 (L)   Depression with emotional eating behaviors Laura Brown is struggling with emotional eating and using food for comfort to the extent that it is negatively impacting her health. She often snacks when she is not hungry. Laura Brown sometimes feels she is out of control and then feels guilty that she made poor food choices. She has been working on behavior modification techniques to help reduce her emotional eating and has been minimally successful. She notes struggling with emotional eating. She shows no sign of suicidal or homicidal ideations but admits to fatigue.   Depression screen [facility] 2/9 11/20/2017 11/07/2017  Decreased Interest 1 3  Down, Depressed, Hopeless 1 2  PHQ - 2 Score 2 5    Altered sleeping 0 2  Tired, decreased energy 2 3  Change in appetite 1 2  Feeling bad or failure about yourself  1 1  Trouble concentrating 0 1  Moving slowly or fidgety/restless 0 2  Suicidal thoughts 0 0  PHQ-9 Score 6 16  Difficult doing work/chores - Not difficult at all   At risk for cardiovascular disease Laura Brown is at a higher than average risk for cardiovascular disease due to obesity. She currently denies any chest pain.  ALLERGIES: No Known Allergies  MEDICATIONS: Current Outpatient Medications on File Prior to Visit  Medication Sig Dispense Refill  . DULoxetine (CYMBALTA) 60 MG capsule Take 60 mg by mouth 2 (two) times daily.    . fexofenadine (ALLEGRA) 180 MG tablet Take 180 mg by mouth daily.    . verapamil (VERELAN) 100 MG 24 hr capsule Take 200 mg by mouth at bedtime.     No current facility-administered medications on file prior to visit.     PAST MEDICAL HISTORY: Past Medical History:  Diagnosis Date  . Anxiety   . Cluster headaches   . Depression   . Migraines   . Osteoarthritis of both knees     PAST SURGICAL HISTORY: Past Surgical History:  Procedure Laterality Date  . CESAREAN SECTION      SOCIAL HISTORY: Social History   Tobacco Use  . Smoking status: Never Smoker  . Smokeless tobacco: Never Used  Substance Use Topics  . Alcohol use: Not on file  . Drug use: Not on file  FAMILY HISTORY: Family History  Problem Relation Age of Onset  . Hyperlipidemia Mother   . Hyperlipidemia Father   . Hypertension Father     ROS: Review of Systems  Constitutional: Positive for malaise/fatigue. Negative for weight loss.  Cardiovascular: Negative for chest pain and claudication.  Gastrointestinal: Negative for nausea and vomiting.  Musculoskeletal: Negative for myalgias.       Negative for muscle weakness  Psychiatric/Behavioral: Positive for depression. Negative for suicidal ideas.       Negative for homicidal ideations     PHYSICAL  EXAM: Blood pressure 135/81, pulse 83, temperature 97.7 F (36.5 C), temperature source Oral, height 5\' 6"  (1.676 m), weight 298 lb (135.2 kg), last menstrual period 10/31/2017, SpO2 99 %. Body mass index is 48.1 kg/m. Physical Exam  Constitutional: She is oriented to person, place, and time. She appears well-developed and well-nourished.  HENT:  Head: Normocephalic.  Neck: Normal range of motion.  Cardiovascular: Normal rate.  Pulmonary/Chest: Effort normal.  Musculoskeletal: Normal range of motion.  Neurological: She is alert and oriented to person, place, and time.  Skin: Skin is warm and dry.  Psychiatric: She has a normal mood and affect. Her behavior is normal.  Vitals reviewed.   RECENT LABS AND TESTS: BMET    Component Value Date/Time   NA 140 11/07/2017 0901   NA 143 03/22/2011 1014   K 4.5 11/07/2017 0901   K 3.9 03/22/2011 1014   CL 100 11/07/2017 0901   CL 107 03/22/2011 1014   CO2 26 11/07/2017 0901   CO2 26 03/22/2011 1014   GLUCOSE 68 11/07/2017 0901   GLUCOSE 77 03/22/2011 1014   BUN 10 11/07/2017 0901   BUN 14 03/22/2011 1014   CREATININE 0.66 11/07/2017 0901   CREATININE 0.73 03/22/2011 1014   CALCIUM 9.1 11/07/2017 0901   CALCIUM 8.7 03/22/2011 1014   GFRNONAA 104 11/07/2017 0901   GFRNONAA >60 03/22/2011 1014   GFRAA 120 11/07/2017 0901   GFRAA >60 03/22/2011 1014   Lab Results  Component Value Date   HGBA1C 4.9 11/07/2017   Lab Results  Component Value Date   INSULIN 6.7 11/07/2017   CBC    Component Value Date/Time   WBC 6.8 11/07/2017 0901   WBC 6.3 03/22/2011 1014   RBC 4.73 11/07/2017 0901   RBC 4.70 03/22/2011 1014   HGB 14.8 11/07/2017 0901   HCT 43.6 11/07/2017 0901   PLT 277 03/22/2011 1014   MCV 92 11/07/2017 0901   MCV 93 03/22/2011 1014   MCH 31.3 11/07/2017 0901   MCH 30.5 03/22/2011 1014   MCHC 33.9 11/07/2017 0901   MCHC 32.8 03/22/2011 1014   RDW 11.2 (L) 11/07/2017 0901   RDW 11.3 (L) 03/22/2011 1014    LYMPHSABS 2.4 11/07/2017 0901   LYMPHSABS 2.0 03/22/2011 1014   MONOABS 0.3 03/22/2011 1014   EOSABS 0.1 11/07/2017 0901   EOSABS 0.1 03/22/2011 1014   BASOSABS 0.0 11/07/2017 0901   BASOSABS 0.0 03/22/2011 1014   Iron/TIBC/Ferritin/ %Sat    Component Value Date/Time   FERRITIN 32 03/22/2011 1014   Lipid Panel     Component Value Date/Time   CHOL 208 (H) 11/07/2017 0901   TRIG 71 11/07/2017 0901   HDL 63 11/07/2017 0901   LDLCALC 131 (H) 11/07/2017 0901   Hepatic Function Panel     Component Value Date/Time   PROT 7.1 11/07/2017 0901   PROT 7.4 03/22/2011 1014   ALBUMIN 4.2 11/07/2017 0901   ALBUMIN 3.6  03/22/2011 1014   AST 13 11/07/2017 0901   AST 21 03/22/2011 1014   ALT 12 11/07/2017 0901   ALT 15 03/22/2011 1014   ALKPHOS 61 11/07/2017 0901   ALKPHOS 54 03/22/2011 1014   BILITOT 0.3 11/07/2017 0901   BILITOT 0.5 03/22/2011 1014      Component Value Date/Time   TSH 0.984 11/07/2017 0901   TSH 1.66 03/22/2011 1014    Ref. Range 11/07/2017 09:01  Vitamin D, 25-Hydroxy Latest Ref Range: 30.0 - 100.0 ng/mL 19.1 (L)   ASSESSMENT AND PLAN: Other hyperlipidemia  Vitamin D deficiency - Plan: Vitamin D, Ergocalciferol, (DRISDOL) 50000 units CAPS capsule  Other depression - with emotional eating - Plan: buPROPion (WELLBUTRIN SR) 150 MG 12 hr tablet  At risk for heart disease  Class 3 severe obesity with serious comorbidity and body mass index (BMI) of 45.0 to 49.9 in adult, unspecified obesity type (HCC)  PLAN: Hyperlipidemia Laura Brown was informed of the American Heart Association Guidelines emphasizing intensive lifestyle modifications as the first line treatment for hyperlipidemia. We discussed many lifestyle modifications today in depth, and Laura Brown will continue to work on decreasing saturated fats such as fatty red meat, butter and many fried foods. She will also increase vegetables and lean protein in her diet and continue to work on exercise and weight loss  efforts. We will repeat labs in 3 months. Agrees to follow up with our clinic as directed.   Vitamin D Deficiency Laura Brown was informed that low vitamin D levels contributes to fatigue and are associated with obesity, breast, and colon cancer. She agrees to start to take prescription Vit D @50 ,000 IU every week #4 with no refills and will follow up for routine testing of vitamin D, at least 2-3 times per year. She was informed of the risk of over-replacement of vitamin D and agrees to not increase her dose unless she discusses this with [place] first. We will repeat labs in 3 months. Agrees to follow up with our clinic as directed.   Depression with Emotional Eating Behaviors We discussed behavior modification techniques today to help Laura Brown deal with her emotional eating and depression. She has agreed to continue to take Cymbalta as prescribed and to start to Wellbutrin SR 150 mg qd #30 with no refills and agreed to follow up as directed.  Cardiovascular risk counseling Laura Brown was given extended (15 minutes) coronary artery disease prevention counseling today. She is 47 y.o. female and has risk factors for heart disease including obesity. We discussed intensive lifestyle modifications today with an emphasis on specific weight loss instructions and strategies. Pt was also informed of the importance of increasing exercise and decreasing saturated fats to help prevent heart disease.  Obesity Laura Brown is currently in the action stage of change. As such, her goal is to continue with weight loss efforts She has agreed to follow the Category 2 plan +100 calories with breakfast options Laura Brown has been instructed to work up to a goal of 150 minutes of combined cardio and strengthening exercise per week for weight loss and overall health benefits. We discussed the following Behavioral Modification Strategies today: increasing lean protein intake, decreasing simple carbohydrates  and work on meal planning and easy  cooking plans   Laura Brown has agreed to follow up with our clinic in 2 weeks. She was informed of the importance of frequent follow up visits to maximize her success with intensive lifestyle modifications for her multiple health conditions.   OBESITY BEHAVIORAL INTERVENTION VISIT  Today's visit  was # 2   Starting weight: 296 lb Starting date: 11/07/17 Today's weight : 298 lb Today's date: 11/21/17 Total lbs lost to date: 0    ASK: We discussed the diagnosis of obesity with Laura Brown today and Laura Brown agreed to give [place] permission to discuss obesity behavioral modification therapy today.  ASSESS: Laura Brown has the diagnosis of obesity and her BMI today is 48.12 Laura Brown is in the action stage of change   ADVISE: Laura Brown was educated on the multiple health risks of obesity as well as the benefit of weight loss to improve her health. She was advised of the need for long term treatment and the importance of lifestyle modifications to improve her current health and to decrease her risk of future health problems.  AGREE: Multiple dietary modification options and treatment options were discussed and  Laura Brown agreed to follow the recommendations documented in the above note.  ARRANGE: Laura Brown was educated on the importance of frequent visits to treat obesity as outlined per CMS and USPSTF guidelines and agreed to schedule her next follow up appointment today.  I, Jeralene Peters, am acting as transcriptionist for Quillian Quince, MD   I have reviewed the above documentation for accuracy and completeness, and I agree with the above. -Quillian Quince, MD

## 2017-11-26 ENCOUNTER — Encounter (INDEPENDENT_AMBULATORY_CARE_PROVIDER_SITE_OTHER): Payer: Self-pay | Admitting: Family Medicine
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
## 2017-12-04 ENCOUNTER — Ambulatory Visit (INDEPENDENT_AMBULATORY_CARE_PROVIDER_SITE_OTHER): Payer: 59 | Admitting: Family Medicine

## 2017-12-04 ENCOUNTER — Encounter (INDEPENDENT_AMBULATORY_CARE_PROVIDER_SITE_OTHER): Payer: Self-pay | Admitting: Family Medicine

## 2017-12-04 VITALS — BP 128/79 | HR 81 | Temp 98.1°F | Ht 66.0 in | Wt 295.0 lb

## 2017-12-04 DIAGNOSIS — F3289 Other specified depressive episodes: Secondary | ICD-10-CM | POA: Diagnosis not present

## 2017-12-04 DIAGNOSIS — E559 Vitamin D deficiency, unspecified: Secondary | ICD-10-CM | POA: Diagnosis not present

## 2017-12-04 DIAGNOSIS — Z6841 Body Mass Index (BMI) 40.0 and over, adult: Secondary | ICD-10-CM

## 2017-12-05 ENCOUNTER — Ambulatory Visit (INDEPENDENT_AMBULATORY_CARE_PROVIDER_SITE_OTHER): Payer: 59 | Admitting: Psychology

## 2017-12-05 ENCOUNTER — Encounter (INDEPENDENT_AMBULATORY_CARE_PROVIDER_SITE_OTHER): Payer: Self-pay | Admitting: Family Medicine

## 2017-12-05 DIAGNOSIS — F330 Major depressive disorder, recurrent, mild: Secondary | ICD-10-CM | POA: Diagnosis not present

## 2017-12-05 NOTE — Progress Notes (Signed)
Office: [phone]  /  Fax: [phone]   HPI:   Chief Complaint: OBESITY Laura Brown is here to discuss her progress with her obesity treatment plan. She is on the  follow the Category 2 plan +100 calories and additional breakfast options and is following her eating plan approximately 60 % of the time. She states she is exercising 0 minutes 0 times per week. Laura Brown had some halloween candy but she is back on tr5ack. She is eating all of the food on her plan most days. She struggles to eat all of the prescribed meat on the plan. Laura Brown Kitchen  Her weight is 295 lb (133.8 kg) today and has had a weight loss of 3 pounds over a period of 2 weeks since her last visit. She has lost 3 lbs since starting treatment with [place].  Vitamin D deficiency Laura Brown has a diagnosis of vitamin D deficiency. Her vit D level is stable but not yet at goal. She is currently taking vit D and denies nausea, vomiting or muscle weakness.  Ref. Range 11/07/2017 09:01  Vitamin D, 25-Hydroxy Latest Ref Range: 30.0 - 100.0 ng/mL 19.1 (L)   Depression with emotional eating behaviors Laura Brown is struggling with emotional eating and using food for comfort to the extent that it is negatively impacting her health. She often snacks when she is not hungry. Laura Brown sometimes feels she is out of control and then feels guilty that she made poor food choices. She has been working on behavior modification techniques to help reduce her emotional eating and has been somewhat successful. She is stable. She is unsure if it has improved, because of Halloween and excess candy intake.  She shows no sign of suicidal or homicidal ideations.  Depression screen [facility] 2/9 11/20/2017 11/07/2017  Decreased Interest 1 3  Down, Depressed, Hopeless 1 2  PHQ - 2 Score 2 5  Altered sleeping 0 2  Tired, decreased energy 2 3  Change in appetite 1 2  Feeling bad or failure about yourself  1 1  Trouble concentrating 0 1  Moving slowly or fidgety/restless 0 2  Suicidal  thoughts 0 0  PHQ-9 Score 6 16  Difficult doing work/chores - Not difficult at all    ALLERGIES: No Known Allergies  MEDICATIONS: Current Outpatient Medications on File Prior to Visit  Medication Sig Dispense Refill  . buPROPion (WELLBUTRIN SR) 150 MG 12 hr tablet Take 1 tablet (150 mg total) by mouth daily. 30 tablet 0  . DULoxetine (CYMBALTA) 60 MG capsule Take 60 mg by mouth 2 (two) times daily.    . fexofenadine (ALLEGRA) 180 MG tablet Take 180 mg by mouth daily.    . verapamil (VERELAN) 100 MG 24 hr capsule Take 200 mg by mouth at bedtime.    . Vitamin D, Ergocalciferol, (DRISDOL) 50000 units CAPS capsule Take 1 capsule (50,000 Units total) by mouth every 7 (seven) days. 4 capsule 0   No current facility-administered medications on file prior to visit.     PAST MEDICAL HISTORY: Past Medical History:  Diagnosis Date  . Anxiety   . Cluster headaches   . Depression   . Migraines   . Osteoarthritis of both knees     PAST SURGICAL HISTORY: Past Surgical History:  Procedure Laterality Date  . CESAREAN SECTION      SOCIAL HISTORY: Social History   Tobacco Use  . Smoking status: Never Smoker  . Smokeless tobacco: Never Used  Substance Use Topics  . Alcohol use: Not on file  .  Drug use: Not on file    FAMILY HISTORY: Family History  Problem Relation Age of Onset  . Hyperlipidemia Mother   . Hyperlipidemia Father   . Hypertension Father     ROS: Review of Systems  Constitutional: Positive for weight loss.  Gastrointestinal: Negative for nausea and vomiting.  Musculoskeletal:       Negative for muscle weakness  Psychiatric/Behavioral: Positive for depression. Negative for suicidal ideas.       Negative for homicidal ideations    PHYSICAL EXAM: Blood pressure 128/79, pulse 81, temperature 98.1 F (36.7 C), temperature source Oral, height 5\' 6"  (1.676 m), weight 295 lb (133.8 kg), SpO2 96 %. Body mass index is 47.61 kg/m. Physical Exam  Constitutional:  She is oriented to person, place, and time. She appears well-developed and well-nourished.  HENT:  Head: Normocephalic.  Neck: Normal range of motion.  Cardiovascular: Normal rate.  Pulmonary/Chest: Effort normal.  Musculoskeletal: Normal range of motion.  Neurological: She is alert and oriented to person, place, and time.  Skin: Skin is warm and dry.  Psychiatric: She has a normal mood and affect. Her behavior is normal.  Vitals reviewed.   RECENT LABS AND TESTS: BMET    Component Value Date/Time   NA 140 11/07/2017 0901   NA 143 03/22/2011 1014   K 4.5 11/07/2017 0901   K 3.9 03/22/2011 1014   CL 100 11/07/2017 0901   CL 107 03/22/2011 1014   CO2 26 11/07/2017 0901   CO2 26 03/22/2011 1014   GLUCOSE 68 11/07/2017 0901   GLUCOSE 77 03/22/2011 1014   BUN 10 11/07/2017 0901   BUN 14 03/22/2011 1014   CREATININE 0.66 11/07/2017 0901   CREATININE 0.73 03/22/2011 1014   CALCIUM 9.1 11/07/2017 0901   CALCIUM 8.7 03/22/2011 1014   GFRNONAA 104 11/07/2017 0901   GFRNONAA >60 03/22/2011 1014   GFRAA 120 11/07/2017 0901   GFRAA >60 03/22/2011 1014   Lab Results  Component Value Date   HGBA1C 4.9 11/07/2017   Lab Results  Component Value Date   INSULIN 6.7 11/07/2017   CBC    Component Value Date/Time   WBC 6.8 11/07/2017 0901   WBC 6.3 03/22/2011 1014   RBC 4.73 11/07/2017 0901   RBC 4.70 03/22/2011 1014   HGB 14.8 11/07/2017 0901   HCT 43.6 11/07/2017 0901   PLT 277 03/22/2011 1014   MCV 92 11/07/2017 0901   MCV 93 03/22/2011 1014   MCH 31.3 11/07/2017 0901   MCH 30.5 03/22/2011 1014   MCHC 33.9 11/07/2017 0901   MCHC 32.8 03/22/2011 1014   RDW 11.2 (L) 11/07/2017 0901   RDW 11.3 (L) 03/22/2011 1014   LYMPHSABS 2.4 11/07/2017 0901   LYMPHSABS 2.0 03/22/2011 1014   MONOABS 0.3 03/22/2011 1014   EOSABS 0.1 11/07/2017 0901   EOSABS 0.1 03/22/2011 1014   BASOSABS 0.0 11/07/2017 0901   BASOSABS 0.0 03/22/2011 1014   Iron/TIBC/Ferritin/ %Sat    Component  Value Date/Time   FERRITIN 32 03/22/2011 1014   Lipid Panel     Component Value Date/Time   CHOL 208 (H) 11/07/2017 0901   TRIG 71 11/07/2017 0901   HDL 63 11/07/2017 0901   LDLCALC 131 (H) 11/07/2017 0901   Hepatic Function Panel     Component Value Date/Time   PROT 7.1 11/07/2017 0901   PROT 7.4 03/22/2011 1014   ALBUMIN 4.2 11/07/2017 0901   ALBUMIN 3.6 03/22/2011 1014   AST 13 11/07/2017 0901   AST  21 03/22/2011 1014   ALT 12 11/07/2017 0901   ALT 15 03/22/2011 1014   ALKPHOS 61 11/07/2017 0901   ALKPHOS 54 03/22/2011 1014   BILITOT 0.3 11/07/2017 0901   BILITOT 0.5 03/22/2011 1014      Component Value Date/Time   TSH 0.984 11/07/2017 0901   TSH 1.66 03/22/2011 1014    Ref. Range 11/07/2017 09:01  Vitamin D, 25-Hydroxy Latest Ref Range: 30.0 - 100.0 ng/mL 19.1 (L)    ASSESSMENT AND PLAN: Vitamin D deficiency  Other depression - with emotional eating  Class 3 severe obesity with serious comorbidity and body mass index (BMI) of 45.0 to 49.9 in adult, unspecified obesity type (HCC)  PLAN: Vitamin D Deficiency Ahjanae was informed that low vitamin D levels contributes to fatigue and are associated with obesity, breast, and colon cancer. She agrees to continue to take prescription Vit D @50 ,000 IU every week and will follow up for routine testing of vitamin D, at least 2-3 times per year. She was informed of the risk of over-replacement of vitamin D and agrees to not increase her dose unless she discusses this with [place] first. Agrees to follow up with our clinic as directed.   Depression with Emotional Eating Behaviors We discussed behavior modification techniques today to help Yomaira deal with her emotional eating and depression. She has agreed to continue Wellbutrin SR 150 mg qd and Cymbalta 60 mg BID. She agrees to follow up with Dr. Dewaine Conger. She has an appointment tomorrow and agreed to follow up as directed.  I spent > than 50% of the 15 minute visit on counseling as  documented in the note.  Obesity Orlandria is currently in the action stage of change. As such, her goal is to continue with weight loss efforts She has agreed to follow the Category 2 plan +100 calories Gave options to eat in place of 2 oz of dinner meat.  Kathaleya has been instructed to work up to a goal of 150 minutes of combined cardio and strengthening exercise per week for weight loss and overall health benefits. We discussed the following Behavioral Modification Strategies today: increasing lean protein intake and increasing water intake.    Retal has agreed to follow up with our clinic in 2 weeks. She was informed of the importance of frequent follow up visits to maximize her success with intensive lifestyle modifications for her multiple health conditions.   OBESITY BEHAVIORAL INTERVENTION VISIT  Today's visit was # 3   Starting weight: 296 lb Starting date: 11/07/17 Today's weight :295 lb Today's date: 12/04/17 Total lbs lost to date: 3 lb    ASK: We discussed the diagnosis of obesity with Lilli Few today and Erikka agreed to give [place] permission to discuss obesity behavioral modification therapy today.  ASSESS: Armetta has the diagnosis of obesity and her BMI today is 47.64 Willisha is in the action stage of change   ADVISE: Adalae was educated on the multiple health risks of obesity as well as the benefit of weight loss to improve her health. She was advised of the need for long term treatment and the importance of lifestyle modifications to improve her current health and to decrease her risk of future health problems.  AGREE: Multiple dietary modification options and treatment options were discussed and  Analys agreed to follow the recommendations documented in the above note.  ARRANGE: Junelle was educated on the importance of frequent visits to treat obesity as outlined per CMS and USPSTF guidelines and agreed  to schedule her next follow up appointment today.  I,  Jeralene Peters, am acting as Energy manager for [place], FNP. I have reviewed the above documentation for accuracy and completeness, and I agree with the above.  - Duke Weisensel, FNP-C.

## 2017-12-17 ENCOUNTER — Ambulatory Visit (INDEPENDENT_AMBULATORY_CARE_PROVIDER_SITE_OTHER): Payer: Self-pay | Admitting: Orthopedic Surgery

## 2017-12-17 ENCOUNTER — Encounter (INDEPENDENT_AMBULATORY_CARE_PROVIDER_SITE_OTHER): Payer: Self-pay | Admitting: Family Medicine

## 2017-12-17 ENCOUNTER — Ambulatory Visit (INDEPENDENT_AMBULATORY_CARE_PROVIDER_SITE_OTHER): Payer: 59 | Admitting: Family Medicine

## 2017-12-17 VITALS — BP 123/76 | HR 84 | Temp 97.7°F | Ht 66.0 in | Wt 291.0 lb

## 2017-12-17 DIAGNOSIS — E559 Vitamin D deficiency, unspecified: Secondary | ICD-10-CM | POA: Diagnosis not present

## 2017-12-17 DIAGNOSIS — F3289 Other specified depressive episodes: Secondary | ICD-10-CM | POA: Diagnosis not present

## 2017-12-17 DIAGNOSIS — Z6841 Body Mass Index (BMI) 40.0 and over, adult: Secondary | ICD-10-CM | POA: Diagnosis not present

## 2017-12-17 DIAGNOSIS — Z9189 Other specified personal risk factors, not elsewhere classified: Secondary | ICD-10-CM

## 2017-12-17 MED ORDER — BUPROPION HCL ER (SR) 200 MG PO TB12: 200.0000 mg | ORAL_TABLET | Freq: Every day | ORAL | 0 refills | Status: DC

## 2017-12-17 MED ORDER — VITAMIN D (ERGOCALCIFEROL) 1.25 MG (50000 UNIT) PO CAPS: 50000.0000 [IU] | ORAL_CAPSULE | ORAL | 0 refills | Status: DC

## 2017-12-17 MED FILL — VIT D2 1.25 MG (50,000 UNIT: 1.25 MG | 28 days supply | Qty: 4 | Fill #0

## 2017-12-17 MED FILL — BUPROPION HCL SR 200 MG TAB: 200 | 30 days supply | Qty: 30 | Fill #0

## 2017-12-19 ENCOUNTER — Encounter (INDEPENDENT_AMBULATORY_CARE_PROVIDER_SITE_OTHER): Payer: Self-pay | Admitting: Family Medicine

## 2017-12-19 NOTE — Progress Notes (Signed)
Office: [phone]  /  Fax: [phone]   HPI:   Chief Complaint: OBESITY [name] is here to discuss her progress with her obesity treatment plan. She is on the Category 2 plan + 100 calories and is following her eating plan approximately 70 % of the time. She states she is exercising 0 minutes 0 times per week. [name] is sticking to the plan fairly well. She has been working on increasing protein in her diet.  Her weight is 291 lb (132 kg) today and has had a weight loss of 4 pounds over a period of 2 weeks since her last visit. She has lost 5 lbs since starting treatment with [place].  Depression with emotional eating behaviors [name] is struggling with emotional eating and using food for comfort to the extent that it is negatively impacting her health. She often snacks when she is not hungry. Timber sometimes feels she is out of control and then feels guilty that she made poor food choices. She has been working on behavior modification techniques to help reduce her emotional eating and has been somewhat successful. She notes that the bupropion is helping and she wants to increase the dose. [name] is having mild dry mouth. She shows no sign of suicidal or homicidal ideations.  Vitamin D deficiency [name] has a diagnosis of vitamin D deficiency. She is currently taking vit D, which is stable, but not at goal. Her vitamin D level is 19.1 on 11/07/17. She denies nausea, vomiting, or muscle weakness.  At risk for osteopenia and osteoporosis [name] is at higher risk of osteopenia and osteoporosis due to vitamin D deficiency.   ALLERGIES: No Known Allergies  MEDICATIONS: Current Outpatient Medications on File Prior to Visit  Medication Sig Dispense Refill  . DULoxetine (CYMBALTA) 60 MG capsule Take 60 mg by mouth 2 (two) times daily.    . fexofenadine (ALLEGRA) 180 MG tablet Take 180 mg by mouth daily.    . verapamil (VERELAN) 100 MG 24 hr capsule Take 200 mg by mouth at bedtime.     No  current facility-administered medications on file prior to visit.     PAST MEDICAL HISTORY: Past Medical History:  Diagnosis Date  . Anxiety   . Cluster headaches   . Depression   . Migraines   . Osteoarthritis of both knees     PAST SURGICAL HISTORY: Past Surgical History:  Procedure Laterality Date  . CESAREAN SECTION      SOCIAL HISTORY: Social History   Tobacco Use  . Smoking status: Never Smoker  . Smokeless tobacco: Never Used  Substance Use Topics  . Alcohol use: Not on file  . Drug use: Not on file    FAMILY HISTORY: Family History  Problem Relation Age of Onset  . Hyperlipidemia Mother   . Hyperlipidemia Father   . Hypertension Father     ROS: Review of Systems  Constitutional: Positive for weight loss.  HENT:       Positive for dry mouth.  Gastrointestinal: Negative for nausea and vomiting.  Musculoskeletal:       Negative for muscle weakness.  Psychiatric/Behavioral: Positive for depression. Negative for suicidal ideas.       Negative for homicidal ideations.    PHYSICAL EXAM: Blood pressure 123/76, pulse 84, temperature 97.7 F (36.5 C), temperature source Oral, height 5\' 6"  (1.676 m), weight 291 lb (132 kg), SpO2 98 %. Body mass index is 46.97 kg/m. Physical Exam  Constitutional: She is oriented to person, place, and time.  She appears well-developed and well-nourished.  Cardiovascular: Normal rate.  Pulmonary/Chest: Effort normal.  Musculoskeletal: Normal range of motion.  Neurological: She is oriented to person, place, and time.  Skin: Skin is warm and dry.  Psychiatric: She has a normal mood and affect. Her behavior is normal.  Vitals reviewed.   RECENT LABS AND TESTS: BMET    Component Value Date/Time   NA 140 11/07/2017 0901   NA 143 03/22/2011 1014   K 4.5 11/07/2017 0901   K 3.9 03/22/2011 1014   CL 100 11/07/2017 0901   CL 107 03/22/2011 1014   CO2 26 11/07/2017 0901   CO2 26 03/22/2011 1014   GLUCOSE 68 11/07/2017 0901    GLUCOSE 77 03/22/2011 1014   BUN 10 11/07/2017 0901   BUN 14 03/22/2011 1014   CREATININE 0.66 11/07/2017 0901   CREATININE 0.73 03/22/2011 1014   CALCIUM 9.1 11/07/2017 0901   CALCIUM 8.7 03/22/2011 1014   GFRNONAA 104 11/07/2017 0901   GFRNONAA >60 03/22/2011 1014   GFRAA 120 11/07/2017 0901   GFRAA >60 03/22/2011 1014   Lab Results  Component Value Date   HGBA1C 4.9 11/07/2017   Lab Results  Component Value Date   INSULIN 6.7 11/07/2017   CBC    Component Value Date/Time   WBC 6.8 11/07/2017 0901   WBC 6.3 03/22/2011 1014   RBC 4.73 11/07/2017 0901   RBC 4.70 03/22/2011 1014   HGB 14.8 11/07/2017 0901   HCT 43.6 11/07/2017 0901   PLT 277 03/22/2011 1014   MCV 92 11/07/2017 0901   MCV 93 03/22/2011 1014   MCH 31.3 11/07/2017 0901   MCH 30.5 03/22/2011 1014   MCHC 33.9 11/07/2017 0901   MCHC 32.8 03/22/2011 1014   RDW 11.2 (L) 11/07/2017 0901   RDW 11.3 (L) 03/22/2011 1014   LYMPHSABS 2.4 11/07/2017 0901   LYMPHSABS 2.0 03/22/2011 1014   MONOABS 0.3 03/22/2011 1014   EOSABS 0.1 11/07/2017 0901   EOSABS 0.1 03/22/2011 1014   BASOSABS 0.0 11/07/2017 0901   BASOSABS 0.0 03/22/2011 1014   Iron/TIBC/Ferritin/ %Sat    Component Value Date/Time   FERRITIN 32 03/22/2011 1014   Lipid Panel     Component Value Date/Time   CHOL 208 (H) 11/07/2017 0901   TRIG 71 11/07/2017 0901   HDL 63 11/07/2017 0901   LDLCALC 131 (H) 11/07/2017 0901   Hepatic Function Panel     Component Value Date/Time   PROT 7.1 11/07/2017 0901   PROT 7.4 03/22/2011 1014   ALBUMIN 4.2 11/07/2017 0901   ALBUMIN 3.6 03/22/2011 1014   AST 13 11/07/2017 0901   AST 21 03/22/2011 1014   ALT 12 11/07/2017 0901   ALT 15 03/22/2011 1014   ALKPHOS 61 11/07/2017 0901   ALKPHOS 54 03/22/2011 1014   BILITOT 0.3 11/07/2017 0901   BILITOT 0.5 03/22/2011 1014      Component Value Date/Time   TSH 0.984 11/07/2017 0901   TSH 1.66 03/22/2011 1014   Results for CORALYN, ROSELLI (MRN [id number])  as of 12/19/2017 14:48  Ref. Range 11/07/2017 09:01  Vitamin D, 25-Hydroxy Latest Ref Range: 30.0 - 100.0 ng/mL 19.1 (L)   ASSESSMENT AND PLAN: Vitamin D deficiency - Plan: Vitamin D, Ergocalciferol, (DRISDOL) 1.25 MG (50000 UT) CAPS capsule  Other depression - with emotional eating  - Plan: buPROPion (WELLBUTRIN SR) 200 MG 12 hr tablet  At risk for osteoporosis  Class 3 severe obesity with serious comorbidity and body mass index (BMI)  of 45.0 to 49.9 in adult, unspecified obesity type (HCC)  PLAN:  Vitamin D Deficiency Laura Brown was informed that low vitamin D levels contributes to fatigue and are associated with obesity, breast, and colon cancer. She agrees to continue to take prescription Vit D @50 ,000 IU weekly #4 with no refills and will follow up for routine testing of vitamin D, at least 2-3 times per year. She was informed of the risk of over-replacement of vitamin D and agrees to not increase her dose unless she discusses this with us first. Laura Brown will follow up as directed.  At risk for osteopenia and osteoporosis Laura Brown was given extended (15 minutes) osteoporosis prevention counseling today. Laura Brown is at risk for osteopenia and osteoporosis due to her vitamin D deficiency. She was encouraged to take her vitamin D and follow her higher calcium diet and increase strengthening exercise to help strengthen her bones and decrease her risk of osteopenia and osteoporosis.  Depression with Emotional Eating Behaviors We discussed behavior modification techniques today to help Laura Brown deal with her emotional eating and depression. She has agreed to increase the dose to bupropion SR 200mg  qAM #30 with no refills and agreed to follow up as directed in 2 weeks.  Obesity Laura Brown is currently in the action stage of change. As such, her goal is to continue with weight loss efforts. She has agreed to follow the Category 2 plan + 100 calories and she may substitute a protein bar if unable to get  lunch. Laura Brown has been instructed to work up to a goal of 150 minutes of combined cardio and strengthening exercise per week for weight loss and overall health benefits. We discussed the following Behavioral Modification Stratgies today: increasing lean protein intake, increase H20 intake, and holiday eating strategies.   Laura Brown has agreed to follow up with our clinic in 2 weeks. She was informed of the importance of frequent follow up visits to maximize her success with intensive lifestyle modifications for her multiple health conditions.   OBESITY BEHAVIORAL INTERVENTION VISIT  Today's visit was # 4   Starting weight: 296 lbs Starting date: 11/07/17 Today's weight : Weight: 291 lb (132 kg)  Today's date: 12/17/2017 Total lbs lost to date: 5  ASK: We discussed the diagnosis of obesity with Lilli FewAngela H Fouche today and Laura Brown agreed to give us permission to discuss obesity behavioral modification therapy today.  ASSESS: Laura Brown has the diagnosis of obesity and her BMI today is 46.99. Laura Brown is in the action stage of change.   ADVISE: Laura Brown was educated on the multiple health risks of obesity as well as the benefit of weight loss to improve her health. She was advised of the need for long term treatment and the importance of lifestyle modifications to improve her current health and to decrease her risk of future health problems.  AGREE: Multiple dietary modification options and treatment options were discussed and Laura Brown agreed to follow the recommendations documented in the above note.  ARRANGE: Laura Brown was educated on the importance of frequent visits to treat obesity as outlined per CMS and USPSTF guidelines and agreed to schedule her next follow up appointment today.  Launa FlightI, Cara Soares, am acting as Energy managertranscriptionist for Jesse Sansawn W. Crickett Abbett, FNP.  I have reviewed the above documentation for accuracy and completeness, and I agree with the above.  - Minnie Legros, FNP-C.

## 2017-12-23 ENCOUNTER — Ambulatory Visit (INDEPENDENT_AMBULATORY_CARE_PROVIDER_SITE_OTHER): Payer: 59 | Admitting: Psychology

## 2017-12-23 DIAGNOSIS — F330 Major depressive disorder, recurrent, mild: Secondary | ICD-10-CM
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
## 2017-12-24 DIAGNOSIS — M170 Bilateral primary osteoarthritis of knee: Secondary | ICD-10-CM | POA: Diagnosis not present

## 2017-12-24 DIAGNOSIS — Z6841 Body Mass Index (BMI) 40.0 and over, adult: Secondary | ICD-10-CM | POA: Diagnosis not present

## 2017-12-24 DIAGNOSIS — R739 Hyperglycemia, unspecified: Secondary | ICD-10-CM | POA: Diagnosis not present

## 2017-12-31 ENCOUNTER — Ambulatory Visit (INDEPENDENT_AMBULATORY_CARE_PROVIDER_SITE_OTHER): Payer: Self-pay | Admitting: Family Medicine

## 2017-12-31 ENCOUNTER — Encounter (INDEPENDENT_AMBULATORY_CARE_PROVIDER_SITE_OTHER): Payer: Self-pay

## 2018-01-13 ENCOUNTER — Encounter (INDEPENDENT_AMBULATORY_CARE_PROVIDER_SITE_OTHER): Payer: Self-pay | Admitting: Family Medicine

## 2018-01-13 ENCOUNTER — Ambulatory Visit (INDEPENDENT_AMBULATORY_CARE_PROVIDER_SITE_OTHER): Payer: 59 | Admitting: Family Medicine

## 2018-01-13 VITALS — BP 122/75 | HR 77 | Ht 66.0 in | Wt 286.0 lb

## 2018-01-13 DIAGNOSIS — E559 Vitamin D deficiency, unspecified: Secondary | ICD-10-CM | POA: Diagnosis not present

## 2018-01-13 DIAGNOSIS — Z6841 Body Mass Index (BMI) 40.0 and over, adult: Secondary | ICD-10-CM | POA: Diagnosis not present

## 2018-01-13 DIAGNOSIS — Z9189 Other specified personal risk factors, not elsewhere classified: Secondary | ICD-10-CM

## 2018-01-13 DIAGNOSIS — F3289 Other specified depressive episodes: Secondary | ICD-10-CM | POA: Diagnosis not present

## 2018-01-13 MED ORDER — BUPROPION HCL ER (SR) 150 MG PO TB12: 150.0000 mg | ORAL_TABLET | Freq: Two times a day (BID) | ORAL | 0 refills | Status: DC

## 2018-01-13 MED ORDER — VITAMIN D (ERGOCALCIFEROL) 1.25 MG (50000 UNIT) PO CAPS: 50000.0000 [IU] | ORAL_CAPSULE | ORAL | 0 refills | Status: DC

## 2018-01-13 MED FILL — VIT D2 1.25 MG (50,000 UNIT: 1.25 MG | 28 days supply | Qty: 4 | Fill #0

## 2018-01-13 MED FILL — BUPROPION SR 150 MG TABLET: 150 | 30 days supply | Qty: 60 | Fill #0

## 2018-01-14 MED FILL — DULoxetine HCL 60 MG CPEP: 60 | 90 days supply | Qty: 180 | Fill #0

## 2018-01-14 NOTE — Progress Notes (Signed)
Office: [phone]  /  Fax: [phone]   HPI:   Chief Complaint: OBESITY Laura Brown is here to discuss her progress with her obesity treatment plan. Laura Brown is on the Category 2 plan + 100 calories and is following her eating plan approximately 80 % of the time. Laura Brown states Laura Brown is exercising 0 minutes 0 times per week. Laura Brown continues to do well with weight loss even over the holidays. Laura Brown is much better at meal planning and prepping, but has had to eat out more at work.  Her weight is 286 lb (129.7 kg) today and has had a weight loss of 5 pounds over a period of 2 weeks since her last visit. Laura Brown has lost 10 lbs since starting treatment with [place].  Vitamin D deficiency Laura Brown has a diagnosis of vitamin D deficiency. Laura Brown is currently stable on vit D, but is not yet at goal. Laura Brown  denies nausea, vomiting, or muscle weakness.  At risk for cardiovascular disease Laura Brown is at a higher than average risk for cardiovascular disease due to vitamin D deficiency and obesity. Laura Brown currently denies any chest pain.  Depression with emotional eating behaviors Laura Brown is struggling with emotional eating and using food for comfort to the extent that it is negatively impacting her health. Laura Brown often snacks when Laura Brown is not hungry. Laura Brown sometimes feels Laura Brown is out of control and then feels guilty that Laura Brown made poor food choices. Laura Brown has been working on behavior modification techniques to help reduce her emotional eating and has been somewhat successful. Her mood is stable on Wellbutrin and Cymbalta, but Laura Brown still struggles with afternoon cravings.   ASSESSMENT AND PLAN:  Vitamin D deficiency - Plan: Vitamin D, Ergocalciferol, (DRISDOL) 1.25 MG (50000 UT) CAPS capsule  Other depression - with emotional eating - Plan: buPROPion (WELLBUTRIN SR) 150 MG 12 hr tablet  At risk for heart disease  Class 3 severe obesity with serious comorbidity and body mass index (BMI) of 45.0 to 49.9 in adult, unspecified obesity type  (HCC)  PLAN:  Vitamin D Deficiency Laura Brown was informed that low vitamin D levels contributes to fatigue and are associated with obesity, breast, and colon cancer. Laura Brown agrees to continue to take prescription Vit D @50 ,000 IU every week #4 with no refills and will follow up for routine testing of vitamin D, at least 2-3 times per year. Laura Brown was informed of the risk of over-replacement of vitamin D and agrees to not increase her dose unless Laura Brown discusses this with [place] first. Laura Brown agrees to follow up in 3 to 4 weeks.  Cardiovascular risk counselling Laura Brown was given extended (15 minutes) coronary artery disease prevention counseling today. Laura Brown is 47 y.o. female and has risk factors for heart disease including vitamin D deficiency and obesity. We discussed intensive lifestyle modifications today with an emphasis on specific weight loss instructions and strategies. Pt was also informed of the importance of increasing exercise and decreasing saturated fats to help prevent heart disease.  Depression with Emotional Eating Behaviors We discussed behavior modification techniques today to help Laura Brown deal with her emotional eating and depression. Laura Brown has agreed to take Wellbutrin SR 150mg  BID #60 with no refills and continue Cymbalta 60mg . Laura Brown agrees to continue with Dr. Dewaine Conger and to follow up as directed.  Obesity Laura Brown is currently in the action stage of change. As such, her goal is to continue with weight loss efforts. Laura Brown has agreed to follow the Category 2 plan + 100 calories. Laura Brown has been instructed  to work up to a goal of 150 minutes of combined cardio and strengthening exercise per week for weight loss and overall health benefits. We discussed the following Behavioral Modification Stratagies today: increasing lean protein intake, decreasing simple carbohydrates, work on meal planning and easy cooking plans, dealing with family or coworker sabotage, holiday eating strategies, and celebration eating  strategies.  Laura Brown has agreed to follow up with our clinic in 3 to 4 weeks. Laura Brown was informed of the importance of frequent follow up visits to maximize her success with intensive lifestyle modifications for her multiple health conditions.  ALLERGIES: No Known Allergies  MEDICATIONS: Current Outpatient Medications on File Prior to Visit  Medication Sig Dispense Refill  . DULoxetine (CYMBALTA) 60 MG capsule Take 60 mg by mouth 2 (two) times daily.    . fexofenadine (ALLEGRA) 180 MG tablet Take 180 mg by mouth daily.    . verapamil (VERELAN) 100 MG 24 hr capsule Take 200 mg by mouth at bedtime.     No current facility-administered medications on file prior to visit.     PAST MEDICAL HISTORY: Past Medical History:  Diagnosis Date  . Anxiety   . Cluster headaches   . Depression   . Migraines   . Osteoarthritis of both knees     PAST SURGICAL HISTORY: Past Surgical History:  Procedure Laterality Date  . CESAREAN SECTION      SOCIAL HISTORY: Social History   Tobacco Use  . Smoking status: Never Smoker  . Smokeless tobacco: Never Used  Substance Use Topics  . Alcohol use: Not on file  . Drug use: Not on file    FAMILY HISTORY: Family History  Problem Relation Age of Onset  . Hyperlipidemia Mother   . Hyperlipidemia Father   . Hypertension Father    ROS: Review of Systems  Constitutional: Positive for weight loss.  Cardiovascular: Negative for chest pain.  Gastrointestinal: Negative for nausea and vomiting.  Musculoskeletal:       Negative for muscle weakness.  Psychiatric/Behavioral: Positive for depression.   PHYSICAL EXAM: Blood pressure 122/75, pulse 77, height 5\' 6"  (1.676 m), weight 286 lb (129.7 kg), last menstrual period 12/30/2017, SpO2 100 %. Body mass index is 46.16 kg/m. Physical Exam Vitals signs reviewed.  Constitutional:      Appearance: Normal appearance. Laura Brown is obese.  Cardiovascular:     Rate and Rhythm: Normal rate.  Pulmonary:      Effort: Pulmonary effort is normal.  Musculoskeletal: Normal range of motion.  Skin:    General: Skin is warm and dry.  Neurological:     Mental Status: Laura Brown is alert and oriented to person, place, and time.  Psychiatric:        Mood and Affect: Mood normal.        Behavior: Behavior normal.    RECENT LABS AND TESTS: BMET    Component Value Date/Time   NA 140 11/07/2017 0901   NA 143 03/22/2011 1014   K 4.5 11/07/2017 0901   K 3.9 03/22/2011 1014   CL 100 11/07/2017 0901   CL 107 03/22/2011 1014   CO2 26 11/07/2017 0901   CO2 26 03/22/2011 1014   GLUCOSE 68 11/07/2017 0901   GLUCOSE 77 03/22/2011 1014   BUN 10 11/07/2017 0901   BUN 14 03/22/2011 1014   CREATININE 0.66 11/07/2017 0901   CREATININE 0.73 03/22/2011 1014   CALCIUM 9.1 11/07/2017 0901   CALCIUM 8.7 03/22/2011 1014   GFRNONAA 104 11/07/2017 0901   GFRNONAA >  60 03/22/2011 1014   GFRAA 120 11/07/2017 0901   GFRAA >60 03/22/2011 1014   Lab Results  Component Value Date   HGBA1C 4.9 11/07/2017   Lab Results  Component Value Date   INSULIN 6.7 11/07/2017   CBC    Component Value Date/Time   WBC 6.8 11/07/2017 0901   WBC 6.3 03/22/2011 1014   RBC 4.73 11/07/2017 0901   RBC 4.70 03/22/2011 1014   HGB 14.8 11/07/2017 0901   HCT 43.6 11/07/2017 0901   PLT 277 03/22/2011 1014   MCV 92 11/07/2017 0901   MCV 93 03/22/2011 1014   MCH 31.3 11/07/2017 0901   MCH 30.5 03/22/2011 1014   MCHC 33.9 11/07/2017 0901   MCHC 32.8 03/22/2011 1014   RDW 11.2 (L) 11/07/2017 0901   RDW 11.3 (L) 03/22/2011 1014   LYMPHSABS 2.4 11/07/2017 0901   LYMPHSABS 2.0 03/22/2011 1014   MONOABS 0.3 03/22/2011 1014   EOSABS 0.1 11/07/2017 0901   EOSABS 0.1 03/22/2011 1014   BASOSABS 0.0 11/07/2017 0901   BASOSABS 0.0 03/22/2011 1014   Iron/TIBC/Ferritin/ %Sat    Component Value Date/Time   FERRITIN 32 03/22/2011 1014   Lipid Panel     Component Value Date/Time   CHOL 208 (H) 11/07/2017 0901   TRIG 71 11/07/2017 0901     HDL 63 11/07/2017 0901   LDLCALC 131 (H) 11/07/2017 0901   Hepatic Function Panel     Component Value Date/Time   PROT 7.1 11/07/2017 0901   PROT 7.4 03/22/2011 1014   ALBUMIN 4.2 11/07/2017 0901   ALBUMIN 3.6 03/22/2011 1014   AST 13 11/07/2017 0901   AST 21 03/22/2011 1014   ALT 12 11/07/2017 0901   ALT 15 03/22/2011 1014   ALKPHOS 61 11/07/2017 0901   ALKPHOS 54 03/22/2011 1014   BILITOT 0.3 11/07/2017 0901   BILITOT 0.5 03/22/2011 1014      Component Value Date/Time   TSH 0.984 11/07/2017 0901   TSH 1.66 03/22/2011 1014   Results for Laura FewSENTER, Janin H (MRN [id number]) as of 01/14/2018 14:24  Ref. Range 11/07/2017 09:01  Vitamin D, 25-Hydroxy Latest Ref Range: 30.0 - 100.0 ng/mL 19.1 (L)    OBESITY BEHAVIORAL INTERVENTION VISIT  Today's visit was # 8   Starting weight: 296 lbs Starting date: 11/07/17 Today's weight : Weight: 286 lb (129.7 kg)  Today's date: 01/13/2018 Total lbs lost to date: 10  ASK: We discussed the diagnosis of obesity with Laura Brown today and Laura Brown agreed to give us permission to discuss obesity behavioral modification therapy today.  ASSESS: Laura Brown has the diagnosis of obesity and her BMI today is 46.1. Laura Brown is in the action stage of change.   ADVISE: Laura Brown was educated on the multiple health risks of obesity as well as the benefit of weight loss to improve her health. Laura Brown was advised of the need for long term treatment and the importance of lifestyle modifications to improve her current health and to decrease her risk of future health problems.  AGREE: Multiple dietary modification options and treatment options were discussed and Laura Brown agreed to follow the recommendations documented in the above note.  ARRANGE: Laura Brown was educated on the importance of frequent visits to treat obesity as outlined per CMS and USPSTF guidelines and agreed to schedule her next follow up appointment today.  I, Kirke Corinara Soares, am acting as  transcriptionist for Wilder Gladearen D. Ziza Hastings, MD  I have reviewed the above documentation for accuracy and completeness, and I agree  with the above. -Dennard Nip, MD

## 2018-01-15 DIAGNOSIS — H5213 Myopia, bilateral: Secondary | ICD-10-CM | POA: Diagnosis not present

## 2018-01-15 MED FILL — VERAPAMIL ER PM 100 MG CAP: 100 | 90 days supply | Qty: 180 | Fill #0
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
## 2018-02-03 ENCOUNTER — Ambulatory Visit (INDEPENDENT_AMBULATORY_CARE_PROVIDER_SITE_OTHER): Payer: 59 | Admitting: Psychology

## 2018-02-03 DIAGNOSIS — F330 Major depressive disorder, recurrent, mild: Secondary | ICD-10-CM | POA: Diagnosis not present

## 2018-02-11 ENCOUNTER — Ambulatory Visit (INDEPENDENT_AMBULATORY_CARE_PROVIDER_SITE_OTHER): Payer: 59 | Admitting: Family Medicine

## 2018-02-11 ENCOUNTER — Encounter (INDEPENDENT_AMBULATORY_CARE_PROVIDER_SITE_OTHER): Payer: Self-pay | Admitting: Family Medicine

## 2018-02-11 VITALS — BP 128/76 | HR 79 | Temp 97.8°F | Ht 66.0 in | Wt 288.0 lb

## 2018-02-11 DIAGNOSIS — E559 Vitamin D deficiency, unspecified: Secondary | ICD-10-CM

## 2018-02-11 DIAGNOSIS — F3289 Other specified depressive episodes: Secondary | ICD-10-CM | POA: Diagnosis not present

## 2018-02-11 DIAGNOSIS — Z6841 Body Mass Index (BMI) 40.0 and over, adult: Secondary | ICD-10-CM

## 2018-02-11 DIAGNOSIS — Z9189 Other specified personal risk factors, not elsewhere classified: Secondary | ICD-10-CM

## 2018-02-11 MED ORDER — VITAMIN D (ERGOCALCIFEROL) 1.25 MG (50000 UNIT) PO CAPS: 50000.0000 [IU] | ORAL_CAPSULE | ORAL | 0 refills | Status: DC

## 2018-02-11 MED ORDER — BUPROPION HCL ER (SR) 200 MG PO TB12: 200.0000 mg | ORAL_TABLET | Freq: Two times a day (BID) | ORAL | 0 refills | Status: DC

## 2018-02-11 MED FILL — BUPROPION HCL SR 200 MG TAB: 200 | 30 days supply | Qty: 60 | Fill #0

## 2018-02-11 MED FILL — VIT D2 1.25 MG (50,000 UNIT: 1.25 MG | 28 days supply | Qty: 4 | Fill #0

## 2018-02-12 NOTE — Progress Notes (Signed)
Office: [phone]  /  Fax: [phone]   HPI:   Chief Complaint: OBESITY Laura Brown is here to discuss her progress with her obesity treatment plan. She is on the Category 2 plan + 100 calories and is following her eating plan approximately 70 % of the time. She states she is walking more. Laura Brown did well minimizing weight gain over Christmas, but had 2 unexpected deaths in the family right afterwards. She got off track as expected. She is ready to go back to Category 2 and do better with meal planning and prepping.  Her weight is 288 lb (130.6 kg) today and has gained 2 pounds since her last visit. She has lost 8 lbs since starting treatment with Laura Brown.  Vitamin D Deficiency Laura Brown has a diagnosis of vitamin D deficiency. She is stable on prescription Vit D, but level is not yet at goal. She denies nausea, vomiting or muscle weakness.  Depression with emotional eating behaviors Laura Brown is on Cymbalta and Wellbutrin, she denies elevated blood pressure, palpitations, or tremor. She has had increased stress recently and would like to increase her Wellbutrin to 200 mg. Laura Brown struggles with emotional eating and using food for comfort to the extent that it is negatively impacting her health. She often snacks when she is not hungry. Laura Brown sometimes feels she is out of control and then feels guilty that she made poor food choices. She has been working on behavior modification techniques to help reduce her emotional eating and has been somewhat successful. She shows no sign of suicidal or homicidal ideations.  Depression screen Laura Brown 2/9 02/03/2018 12/23/2017 12/05/2017 11/20/2017 11/07/2017  Decreased Interest 0 0 0 1 3  Down, Depressed, Hopeless 0 0 0 1 2  PHQ - 2 Score 0 0 0 2 5  Altered sleeping 1 0 0 0 2  Tired, decreased energy 0 0 1 2 3   Change in appetite 0 0 0 1 2  Feeling bad or failure about yourself  1 0 0 1 1  Trouble concentrating 0 0 0 0 1  Moving slowly or fidgety/restless 0 0 0 0 2    Suicidal thoughts 0 0 0 0 0  PHQ-9 Score 2 0 1 6 16   Difficult doing work/chores - - - - Not difficult at all    At risk for cardiovascular disease Laura Brown is at a higher than average risk for cardiovascular disease due to obesity. She currently denies any chest pain.  ASSESSMENT AND PLAN:  Vitamin D deficiency - Plan: Vitamin D, Ergocalciferol, (DRISDOL) 1.25 MG (50000 UT) CAPS capsule  Other depression - Plan: buPROPion (WELLBUTRIN SR) 200 MG 12 hr tablet  At risk for heart disease  Class 3 severe obesity with serious comorbidity and body mass index (BMI) of 45.0 to 49.9 in adult, unspecified obesity type (HCC)  PLAN:  Vitamin D Deficiency Laura Brown was informed that low vitamin D levels contributes to fatigue and are associated with obesity, breast, and colon cancer. Laura Brown agrees to continue taking prescription Vit D @50 ,000 IU every week #4 and we will refill for 1 month. She will follow up for routine testing of vitamin D, at least 2-3 times per year. She was informed of the risk of over-replacement of vitamin D and agrees to not increase her dose unless she discusses this with Laura Brown first. Laura Brown agrees to follow up with our clinic in 2 weeks.  Depression with Emotional Eating Behaviors We discussed behavior modification techniques today to help Laura Brown deal with her emotional eating  and depression. Laura Brown agrees to increase Wellbutrin SR to 200 mg BID #60 with no refills, and she will continue taking Cymbalta. We discussed possible side effects of elevated blood pressure, dry mouth, and palpitations. Laura Brown agrees to follow up with our clinic in 2 weeks.  Cardiovascular risk counselling Laura Brown was given extended (15 minutes) coronary artery disease prevention counseling today. She is 48 y.o. female and has risk factors for heart disease including obesity. We discussed intensive lifestyle modifications today with an emphasis on specific weight loss instructions and strategies. Pt was also  informed of the importance of increasing exercise and decreasing saturated fats to help prevent heart disease.  Obesity Laura Brown is currently in the action stage of change. As such, her goal is to continue with weight loss efforts She has agreed to follow the Category 2 plan + 100 calories Laura Brown has been instructed to work up to a goal of 150 minutes of combined cardio and strengthening exercise per week for weight loss and overall health benefits. We discussed the following Behavioral Modification Strategies today: increasing lean protein intake, decreasing simple carbohydrates , work on meal planning and easy cooking plans and emotional eating strategies   Laura Brown has agreed to follow up with our clinic in 2 weeks. She was informed of the importance of frequent follow up visits to maximize her success with intensive lifestyle modifications for her multiple health conditions.  ALLERGIES: No Known Allergies  MEDICATIONS: Current Outpatient Medications on File Prior to Visit  Medication Sig Dispense Refill  . DULoxetine (CYMBALTA) 60 MG capsule Take 60 mg by mouth 2 (two) times daily.    . fexofenadine (ALLEGRA) 180 MG tablet Take 180 mg by mouth daily.    . verapamil (VERELAN) 100 MG 24 hr capsule Take 200 mg by mouth at bedtime.     No current facility-administered medications on file prior to visit.     PAST MEDICAL HISTORY: Past Medical History:  Diagnosis Date  . Anxiety   . Cluster headaches   . Depression   . Migraines   . Osteoarthritis of both knees     PAST SURGICAL HISTORY: Past Surgical History:  Procedure Laterality Date  . CESAREAN SECTION      SOCIAL HISTORY: Social History   Tobacco Use  . Smoking status: Never Smoker  . Smokeless tobacco: Never Used  Substance Use Topics  . Alcohol use: Not on file  . Drug use: Not on file    FAMILY HISTORY: Family History  Problem Relation Age of Onset  . Hyperlipidemia Mother   . Hyperlipidemia Father   .  Hypertension Father     ROS: Review of Systems  Constitutional: Negative for weight loss.  Cardiovascular: Negative for chest pain and palpitations.  Gastrointestinal: Negative for nausea and vomiting.  Musculoskeletal:       Negative muscle weakness  Neurological: Negative for tremors.  Psychiatric/Behavioral: Positive for depression. Negative for suicidal ideas.    PHYSICAL EXAM: Blood pressure 128/76, pulse 79, temperature 97.8 F (36.6 C), temperature source Oral, height 5\' 6"  (1.676 m), weight 288 lb (130.6 kg), SpO2 99 %. Body mass index is 46.48 kg/m. Physical Exam Vitals signs reviewed.  Constitutional:      Appearance: Normal appearance. She is obese.  Cardiovascular:     Rate and Rhythm: Normal rate.     Pulses: Normal pulses.  Pulmonary:     Effort: Pulmonary effort is normal.     Breath sounds: Normal breath sounds.  Musculoskeletal: Normal range of motion.  Skin:    General: Skin is warm and dry.  Neurological:     Mental Status: She is alert and oriented to person, place, and time.  Psychiatric:        Mood and Affect: Mood normal.        Behavior: Behavior normal.     RECENT LABS AND TESTS: BMET    Component Value Date/Time   NA 140 11/07/2017 0901   NA 143 03/22/2011 1014   K 4.5 11/07/2017 0901   K 3.9 03/22/2011 1014   CL 100 11/07/2017 0901   CL 107 03/22/2011 1014   CO2 26 11/07/2017 0901   CO2 26 03/22/2011 1014   GLUCOSE 68 11/07/2017 0901   GLUCOSE 77 03/22/2011 1014   BUN 10 11/07/2017 0901   BUN 14 03/22/2011 1014   CREATININE 0.66 11/07/2017 0901   CREATININE 0.73 03/22/2011 1014   CALCIUM 9.1 11/07/2017 0901   CALCIUM 8.7 03/22/2011 1014   GFRNONAA 104 11/07/2017 0901   GFRNONAA >60 03/22/2011 1014   GFRAA 120 11/07/2017 0901   GFRAA >60 03/22/2011 1014   Lab Results  Component Value Date   HGBA1C 4.9 11/07/2017   Lab Results  Component Value Date   INSULIN 6.7 11/07/2017   CBC    Component Value Date/Time   WBC  6.8 11/07/2017 0901   WBC 6.3 03/22/2011 1014   RBC 4.73 11/07/2017 0901   RBC 4.70 03/22/2011 1014   HGB 14.8 11/07/2017 0901   HCT 43.6 11/07/2017 0901   PLT 277 03/22/2011 1014   MCV 92 11/07/2017 0901   MCV 93 03/22/2011 1014   MCH 31.3 11/07/2017 0901   MCH 30.5 03/22/2011 1014   MCHC 33.9 11/07/2017 0901   MCHC 32.8 03/22/2011 1014   RDW 11.2 (L) 11/07/2017 0901   RDW 11.3 (L) 03/22/2011 1014   LYMPHSABS 2.4 11/07/2017 0901   LYMPHSABS 2.0 03/22/2011 1014   MONOABS 0.3 03/22/2011 1014   EOSABS 0.1 11/07/2017 0901   EOSABS 0.1 03/22/2011 1014   BASOSABS 0.0 11/07/2017 0901   BASOSABS 0.0 03/22/2011 1014   Iron/TIBC/Ferritin/ %Sat    Component Value Date/Time   FERRITIN 32 03/22/2011 1014   Lipid Panel     Component Value Date/Time   CHOL 208 (H) 11/07/2017 0901   TRIG 71 11/07/2017 0901   HDL 63 11/07/2017 0901   LDLCALC 131 (H) 11/07/2017 0901   Hepatic Function Panel     Component Value Date/Time   PROT 7.1 11/07/2017 0901   PROT 7.4 03/22/2011 1014   ALBUMIN 4.2 11/07/2017 0901   ALBUMIN 3.6 03/22/2011 1014   AST 13 11/07/2017 0901   AST 21 03/22/2011 1014   ALT 12 11/07/2017 0901   ALT 15 03/22/2011 1014   ALKPHOS 61 11/07/2017 0901   ALKPHOS 54 03/22/2011 1014   BILITOT 0.3 11/07/2017 0901   BILITOT 0.5 03/22/2011 1014      Component Value Date/Time   TSH 0.984 11/07/2017 0901   TSH 1.66 03/22/2011 1014      OBESITY BEHAVIORAL INTERVENTION VISIT  Today's visit was # 6   Starting weight: 296 lbs Starting date: 11/07/17 Today's weight : 288 lbs Today's date: 02/11/2018 Total lbs lost to date: 8    ASK: We discussed the diagnosis of obesity with Laura Brown today and Jaquaya agreed to give [place] permission to discuss obesity behavioral modification therapy today.  ASSESS: Wladyslawa has the diagnosis of obesity and her BMI today is 46.51 Jireh is in the action  stage of change   ADVISE: Laura Brown was educated on the multiple health risks  of obesity as well as the benefit of weight loss to improve her health. She was advised of the need for long term treatment and the importance of lifestyle modifications to improve her current health and to decrease her risk of future health problems.  AGREE: Multiple dietary modification options and treatment options were discussed and  Laura Brown agreed to follow the recommendations documented in the above note.  ARRANGE: Laura Brown was educated on the importance of frequent visits to treat obesity as outlined per CMS and USPSTF guidelines and agreed to schedule her next follow up appointment today.  I, Burt KnackSharon Martin, am acting as transcriptionist for Quillian Quincearen , MD  I have reviewed the above documentation for accuracy and completeness, and I agree with the above. -Quillian Quincearen , MD

## 2018-02-17 NOTE — Progress Notes (Signed)
Office: [phone]  /  Fax: [phone]    Date: February 24, 2018   Time Seen: 12:00pm Duration: 35 minutes Provider: Lawerance Cruel, Psy.D. Type of Session: Individual Therapy  Type of Contact: Face-to-face  Session Content: Laura Brown, a 48 year old female, presented for a follow-up appointment to address the previously established treatment goal of decreasing emotional eating. The session was initiated with the administration of the PHQ-9 and GAD-7, as well as a brief check-in. Laura Brown discussed recent stressors have impacted her eating habits; however, this morning she indicated a desire to commit "100% to the prescribed meal plan." She also discussed engaging in meal prep. This provider gave Laura Brown handouts for seasonings to further assist with meal preparation, and recommended she speak with Dr. Dalbert Garnet at their next appointment to discuss other options. Moreover, Laura Brown discussed dinner and after dinner tends to be the most difficult for her in terms of urges and cravings; however, she discussed making better choices. Regarding mindfulness, Laura Brown noted "I'm definitely more aware" especially as it relates to cravings. As such, this provider introduced "urge surfing," a mindfulness exercise to assist with cravings. A handout was provided. In addition, further psychoeducation regarding another mindfulness exercise surrounding thoughts was provided. Due to recent events, this provider recommended an additional appointment with this provider. Laura Brown agreed. Laura Brown was receptive to today's session as evidenced by openness to sharing, responsiveness to feedback, and willingness to engage in discussed skills.  Mental Status Examination: Laura Brown arrived on time for the appointment. She presented as appropriately dressed and groomed. Laura Brown appeared her stated age and demonstrated adequate orientation to time, place, person, and purpose of the appointment. She also demonstrated appropriate eye contact. No  psychomotor abnormalities or behavioral peculiarities noted. Her mood was euthymic with congruent affect. Her thought processes were logical, linear, and goal-directed. No hallucinations, delusions, bizarre thinking or behavior reported or observed. Judgment, insight, and impulse control appeared to be grossly intact. There was no evidence of paraphasias (i.e., errors in speech, gross mispronunciations, and word substitutions), repetition deficits, or disturbances in volume or prosody (i.e., rhythm and intonation). There was no evidence of attention or memory impairments. Laura Brown denied current suicidal and homicidal ideation, plan and intent.   Structured Assessment Results: The Patient Health Questionnaire-9 (PHQ-9) is a self-report measure that assesses symptoms and severity of depression over the course of the last two weeks. Laura Brown obtained a score of zero. Depression screen [facility] 2/9 02/24/2018  Decreased Interest 0  Down, Depressed, Hopeless 0  PHQ - 2 Score 0  Altered sleeping 0  Tired, decreased energy 0  Change in appetite 0  Feeling bad or failure about yourself  0  Trouble concentrating 0  Moving slowly or fidgety/restless 0  Suicidal thoughts 0  PHQ-9 Score 0  Difficult doing work/chores -   The Generalized Anxiety Disorder-7 (GAD-7) is a brief self-report measure that assesses symptoms of anxiety over the course of the last two weeks. Laura Brown obtained a score of zero. GAD 7 : Generalized Anxiety Score 02/24/2018  Nervous, Anxious, on Edge 0  Control/stop worrying 0  Worry too much - different things 0  Trouble relaxing 0  Restless 0  Easily annoyed or irritable 0  Afraid - awful might happen 0  Total GAD 7 Score 0  Anxiety Difficulty -   Interventions:  Administration of PHQ-9 and GAD-7 for symptom monitoring Review of content from the previous session Empathic reflections and validation Mindfulness exercise Termination planning Brief chart review  DSM-5 Diagnosis: 296.31  (F33.0) Major Depressive  Disorder, Recurrent Episode, Mild, With Anxious Distress, Mild  Treatment Goal & Progress: During the initial appointment with this provider, the following treatment goal was established: decrease emotional eating. Laura Brown has demonstrated progress in her goal as evidenced by increased awareness of hunger patterns and triggers for emotional eating. She continues to engage in learned skills, and noted a reduction in emotional eating.   Plan: Laura Brown continues to appear able and willing to participate as evidenced by engagement in reciprocal conversation, and asking questions for clarification as appropriate. The next appointment will be scheduled in two weeks. The next session will focus on reviewing learned skills, and termination.

## 2018-02-24 ENCOUNTER — Encounter (INDEPENDENT_AMBULATORY_CARE_PROVIDER_SITE_OTHER): Payer: Self-pay | Admitting: Family Medicine

## 2018-02-24 ENCOUNTER — Ambulatory Visit (INDEPENDENT_AMBULATORY_CARE_PROVIDER_SITE_OTHER): Payer: 59 | Admitting: Psychology

## 2018-02-24 DIAGNOSIS — F330 Major depressive disorder, recurrent, mild: Secondary | ICD-10-CM

## 2018-02-24 NOTE — Telephone Encounter (Signed)
Can you look into this please

## 2018-02-25 ENCOUNTER — Encounter (INDEPENDENT_AMBULATORY_CARE_PROVIDER_SITE_OTHER): Payer: Self-pay | Admitting: Family Medicine

## 2018-02-25 ENCOUNTER — Ambulatory Visit (INDEPENDENT_AMBULATORY_CARE_PROVIDER_SITE_OTHER): Payer: 59 | Admitting: Family Medicine

## 2018-02-25 VITALS — BP 128/78 | HR 76 | Ht 66.0 in | Wt 287.0 lb

## 2018-02-25 DIAGNOSIS — F3289 Other specified depressive episodes: Secondary | ICD-10-CM

## 2018-02-25 DIAGNOSIS — I10 Essential (primary) hypertension: Secondary | ICD-10-CM | POA: Diagnosis not present

## 2018-02-25 DIAGNOSIS — Z9189 Other specified personal risk factors, not elsewhere classified: Secondary | ICD-10-CM

## 2018-02-25 DIAGNOSIS — Z6841 Body Mass Index (BMI) 40.0 and over, adult: Secondary | ICD-10-CM

## 2018-02-25 MED ORDER — INSULIN PEN NEEDLE 32G X 4 MM MISC: 1.0000 | Freq: Every day | 0 refills | Status: DC

## 2018-02-25 MED ORDER — LIRAGLUTIDE -WEIGHT MANAGEMENT 18 MG/3ML ~~LOC~~ SOPN: 3.0000 mg | PEN_INJECTOR | Freq: Every day | SUBCUTANEOUS | 0 refills | Status: DC

## 2018-02-26 NOTE — Progress Notes (Signed)
Office: [phone]  /  Fax: [phone]   HPI:   Chief Complaint: OBESITY Laura Brown is here to discuss her progress with her obesity treatment plan. She is on the Category 2 plan + 100 calories and is following her eating plan approximately 75 % of the time. She states she is exercising 0 minutes 0 times per week. Laura Brown is continuing with weight loss efforts and finds breakfast and lunch easy to follow. She is struggling with dinner and evening snacking.  Her weight is 287 lb (130.2 kg) today and has had a weight loss of 1 pound over a period of 2 weeks since her last visit. She has lost 9 lbs since starting treatment with us.  Depression with emotional eating behaviors Laura Brown's mood is stable on Wellbutrin and Cymbalta. Her blood pressure is controlled and there are no signs of serotonin syndrome. She feels better about decreased emotional eating. She often snacks when she is not hungry. Laura Brown sometimes feels she is out of control and then feels guilty that she made poor food choices. She has been working on behavior modification techniques to help reduce her emotional eating and has been somewhat successful. Laura Brown denies insomnia.  Hypertension Laura Brown is a 48 y.o. female with hypertension. Laura Brown blood pressure is stable on verapamil. She is working on diet and weight loss to help control her blood pressure with the goal of decreasing her risk of heart attack and stroke. Laura Brown denies chest pain or headaches.  At risk for cardiovascular disease Laura Brown is at a higher than average risk for cardiovascular disease due to hypertension and obesity. She currently denies any chest pain.  ASSESSMENT AND PLAN:  Essential hypertension  Other depression - with emotional eating   At risk for heart disease  Class 3 severe obesity with serious comorbidity and body mass index (BMI) of 45.0 to 49.9 in adult, unspecified obesity type (HCC) - Plan: Liraglutide -Weight Management (SAXENDA)  18 MG/3ML SOPN, Insulin Pen Needle (BD PEN NEEDLE NANO 2ND GEN) 32G X 4 MM MISC  PLAN:  Hypertension We discussed sodium restriction, working on healthy weight loss, and a regular exercise program as the means to achieve improved blood pressure control. We will continue to monitor her blood pressure as well as her progress with the above lifestyle modifications. She will continue her diet, weight loss, and medications as prescribed. She will watch for signs of hypotension as she continues her lifestyle modifications. Laura Brown agreed with this plan and agreed to follow up as directed in 2 to 3 weeks.  Cardiovascular risk counseling Laura Brown was given extended (15 minutes) coronary artery disease prevention counseling today. She is 48 y.o. female and has risk factors for heart disease including hypertension and obesity. We discussed intensive lifestyle modifications today with an emphasis on specific weight loss instructions and strategies. Pt was also informed of the importance of increasing exercise and decreasing saturated fats to help prevent heart disease.  Depression with Emotional Eating Behaviors We discussed behavior modification techniques today to help Laura Brown deal with her emotional eating and depression. She has agreed to take Wellbutrin SR 150mg  qd and agreed to follow up with Dr. Dewaine CongerBarker as instructed.  Obesity Laura Brown is currently in the action stage of change. As such, her goal is to continue with weight loss efforts. She has agreed to follow the Category 2 plan + 100 calories. Laura Brown has been instructed to work up to a goal of 150 minutes of combined cardio and strengthening exercise per week  for weight loss and overall health benefits. We discussed the following Behavioral Modification Strategies today: increasing lean protein intake, decreasing simple carbohydrates, work on meal planning and easy cooking plans, and emotional eating strategies. We discussed various medication options to  help Laura Brown with her weight loss efforts and we both agreed to start Saxenda 3.0mg  qd #5 with pen needles and no refills.  Laura Brown has agreed to follow up with our clinic in 2 to 3 weeks. She was informed of the importance of frequent follow up visits to maximize her success with intensive lifestyle modifications for her multiple health conditions.  ALLERGIES: No Known Allergies  MEDICATIONS: Current Outpatient Medications on File Prior to Visit  Medication Sig Dispense Refill  . buPROPion (WELLBUTRIN SR) 200 MG 12 hr tablet Take 1 tablet (200 mg total) by mouth 2 (two) times daily. 60 tablet 0  . DULoxetine (CYMBALTA) 60 MG capsule Take 60 mg by mouth 2 (two) times daily.    . fexofenadine (ALLEGRA) 180 MG tablet Take 180 mg by mouth daily.    . verapamil (VERELAN) 100 MG 24 hr capsule Take 200 mg by mouth at bedtime.    . Vitamin D, Ergocalciferol, (DRISDOL) 1.25 MG (50000 UT) CAPS capsule Take 1 capsule (50,000 Units total) by mouth every 7 (seven) days. 4 capsule 0   No current facility-administered medications on file prior to visit.     PAST MEDICAL HISTORY: Past Medical History:  Diagnosis Date  . Anxiety   . Cluster headaches   . Depression   . Migraines   . Osteoarthritis of both knees     PAST SURGICAL HISTORY: Past Surgical History:  Procedure Laterality Date  . CESAREAN SECTION      SOCIAL HISTORY: Social History   Tobacco Use  . Smoking status: Never Smoker  . Smokeless tobacco: Never Used  Substance Use Topics  . Alcohol use: Not on file  . Drug use: Not on file    FAMILY HISTORY: Family History  Problem Relation Age of Onset  . Hyperlipidemia Mother   . Hyperlipidemia Father   . Hypertension Father     ROS: Review of Systems  Constitutional: Positive for weight loss.  Cardiovascular: Negative for chest pain.  Neurological: Negative for headaches.  Psychiatric/Behavioral: Positive for depression.    PHYSICAL EXAM: Blood pressure 128/78,  pulse 76, height 5\' 6"  (1.676 m), weight 287 lb (130.2 kg), SpO2 100 %. Body mass index is 46.32 kg/m. Physical Exam Vitals signs reviewed.  Constitutional:      Appearance: Normal appearance. She is obese.  Cardiovascular:     Rate and Rhythm: Normal rate.  Pulmonary:     Effort: Pulmonary effort is normal.  Musculoskeletal: Normal range of motion.  Skin:    General: Skin is warm and dry.  Neurological:     Mental Status: She is alert and oriented to person, place, and time.  Psychiatric:        Mood and Affect: Mood normal.        Behavior: Behavior normal.     RECENT LABS AND TESTS: BMET    Component Value Date/Time   NA 140 11/07/2017 0901   NA 143 03/22/2011 1014   K 4.5 11/07/2017 0901   K 3.9 03/22/2011 1014   CL 100 11/07/2017 0901   CL 107 03/22/2011 1014   CO2 26 11/07/2017 0901   CO2 26 03/22/2011 1014   GLUCOSE 68 11/07/2017 0901   GLUCOSE 77 03/22/2011 1014   BUN 10 11/07/2017  0901   BUN 14 03/22/2011 1014   CREATININE 0.66 11/07/2017 0901   CREATININE 0.73 03/22/2011 1014   CALCIUM 9.1 11/07/2017 0901   CALCIUM 8.7 03/22/2011 1014   GFRNONAA 104 11/07/2017 0901   GFRNONAA >60 03/22/2011 1014   GFRAA 120 11/07/2017 0901   GFRAA >60 03/22/2011 1014   Lab Results  Component Value Date   HGBA1C 4.9 11/07/2017   Lab Results  Component Value Date   INSULIN 6.7 11/07/2017   CBC    Component Value Date/Time   WBC 6.8 11/07/2017 0901   WBC 6.3 03/22/2011 1014   RBC 4.73 11/07/2017 0901   RBC 4.70 03/22/2011 1014   HGB 14.8 11/07/2017 0901   HCT 43.6 11/07/2017 0901   PLT 277 03/22/2011 1014   MCV 92 11/07/2017 0901   MCV 93 03/22/2011 1014   MCH 31.3 11/07/2017 0901   MCH 30.5 03/22/2011 1014   MCHC 33.9 11/07/2017 0901   MCHC 32.8 03/22/2011 1014   RDW 11.2 (L) 11/07/2017 0901   RDW 11.3 (L) 03/22/2011 1014   LYMPHSABS 2.4 11/07/2017 0901   LYMPHSABS 2.0 03/22/2011 1014   MONOABS 0.3 03/22/2011 1014   EOSABS 0.1 11/07/2017 0901    EOSABS 0.1 03/22/2011 1014   BASOSABS 0.0 11/07/2017 0901   BASOSABS 0.0 03/22/2011 1014   Iron/TIBC/Ferritin/ %Sat    Component Value Date/Time   FERRITIN 32 03/22/2011 1014   Lipid Panel     Component Value Date/Time   CHOL 208 (H) 11/07/2017 0901   TRIG 71 11/07/2017 0901   HDL 63 11/07/2017 0901   LDLCALC 131 (H) 11/07/2017 0901   Hepatic Function Panel     Component Value Date/Time   PROT 7.1 11/07/2017 0901   PROT 7.4 03/22/2011 1014   ALBUMIN 4.2 11/07/2017 0901   ALBUMIN 3.6 03/22/2011 1014   AST 13 11/07/2017 0901   AST 21 03/22/2011 1014   ALT 12 11/07/2017 0901   ALT 15 03/22/2011 1014   ALKPHOS 61 11/07/2017 0901   ALKPHOS 54 03/22/2011 1014   BILITOT 0.3 11/07/2017 0901   BILITOT 0.5 03/22/2011 1014      Component Value Date/Time   TSH 0.984 11/07/2017 0901   TSH 1.66 03/22/2011 1014   Results for TYRANNY, HELMKE (MRN [id number]) as of 02/26/2018 09:34  Ref. Range 11/07/2017 09:01  Vitamin D, 25-Hydroxy Latest Ref Range: 30.0 - 100.0 ng/mL 19.1 (L)   OBESITY BEHAVIORAL INTERVENTION VISIT  Today's visit was # 7   Starting weight: 296 lbs Starting date: 11/07/17 Today's weight : Weight: 287 lb (130.2 kg)  Today's date: 02/25/2018 Total lbs lost to date: 9  ASK: We discussed the diagnosis of obesity with Laura Brown today and Tammra agreed to give [place] permission to discuss obesity behavioral modification therapy today.  ASSESS: Avenell has the diagnosis of obesity and her BMI today is 46.3. Jadyne is in the action stage of change.   ADVISE: Laura Brown was educated on the multiple health risks of obesity as well as the benefit of weight loss to improve her health. She was advised of the need for long term treatment and the importance of lifestyle modifications to improve her current health and to decrease her risk of future health problems.  AGREE: Multiple dietary modification options and treatment options were discussed and Laura Brown agreed to follow  the recommendations documented in the above note.  ARRANGE: Laura Brown was educated on the importance of frequent visits to treat obesity as outlined per CMS and USPSTF  guidelines and agreed to schedule her next follow up appointment today.  I, Marcille Blanco, am acting as transcriptionist for Starlyn Skeans, MD  I have reviewed the above documentation for accuracy and completeness, and I agree with the above. -Dennard Nip, MD

## 2018-03-03 ENCOUNTER — Encounter (INDEPENDENT_AMBULATORY_CARE_PROVIDER_SITE_OTHER): Payer: Self-pay

## 2018-03-03 MED FILL — UNIFINE PENTIPS 32GX5/32": 32G X 4 MM | 90 days supply | Qty: 100 | Fill #0

## 2018-03-03 MED FILL — UNIFINE PENTIPS 32GX5/32: 32G X 4 MM | 90 days supply | Qty: 100 | Fill #0

## 2018-03-03 MED FILL — SAXENDA 18 MG/3 ML PEN: 18 | 30 days supply | Qty: 15 | Fill #0

## 2018-03-05 ENCOUNTER — Encounter (INDEPENDENT_AMBULATORY_CARE_PROVIDER_SITE_OTHER): Payer: Self-pay | Admitting: Family Medicine

## 2018-03-06 ENCOUNTER — Telehealth: Payer: 59 | Admitting: Physician Assistant

## 2018-03-06 DIAGNOSIS — R6889 Other general symptoms and signs: Secondary | ICD-10-CM

## 2018-03-06 DIAGNOSIS — Z20828 Contact with and (suspected) exposure to other viral communicable diseases: Secondary | ICD-10-CM | POA: Diagnosis not present

## 2018-03-06 MED ORDER — OSELTAMIVIR PHOSPHATE 75 MG PO CAPS: 75.0000 mg | ORAL_CAPSULE | Freq: Two times a day (BID) | ORAL | 0 refills | Status: DC

## 2018-03-06 NOTE — Addendum Note (Signed)
Addended by: Waldon Merl on: 03/06/2018 07:16 PM   Modules accepted: Orders

## 2018-03-06 NOTE — Progress Notes (Signed)

## 2018-03-10 ENCOUNTER — Ambulatory Visit (INDEPENDENT_AMBULATORY_CARE_PROVIDER_SITE_OTHER): Payer: Self-pay | Admitting: Psychology

## 2018-03-10 ENCOUNTER — Encounter (INDEPENDENT_AMBULATORY_CARE_PROVIDER_SITE_OTHER): Payer: Self-pay

## 2018-03-11 ENCOUNTER — Encounter (INDEPENDENT_AMBULATORY_CARE_PROVIDER_SITE_OTHER): Payer: Self-pay

## 2018-03-20 ENCOUNTER — Ambulatory Visit (INDEPENDENT_AMBULATORY_CARE_PROVIDER_SITE_OTHER): Payer: Self-pay | Admitting: Family Medicine

## 2018-03-25 ENCOUNTER — Ambulatory Visit (INDEPENDENT_AMBULATORY_CARE_PROVIDER_SITE_OTHER): Payer: 59 | Admitting: Family Medicine

## 2018-03-25 ENCOUNTER — Encounter (INDEPENDENT_AMBULATORY_CARE_PROVIDER_SITE_OTHER): Payer: Self-pay | Admitting: Family Medicine

## 2018-03-25 VITALS — BP 137/78 | HR 83 | Temp 98.0°F | Ht 66.0 in | Wt 280.0 lb

## 2018-03-25 DIAGNOSIS — F3289 Other specified depressive episodes: Secondary | ICD-10-CM | POA: Diagnosis not present

## 2018-03-25 DIAGNOSIS — E559 Vitamin D deficiency, unspecified: Secondary | ICD-10-CM | POA: Diagnosis not present

## 2018-03-25 DIAGNOSIS — Z9189 Other specified personal risk factors, not elsewhere classified: Secondary | ICD-10-CM

## 2018-03-25 DIAGNOSIS — Z6841 Body Mass Index (BMI) 40.0 and over, adult: Secondary | ICD-10-CM

## 2018-03-25 MED ORDER — VITAMIN D (ERGOCALCIFEROL) 1.25 MG (50000 UNIT) PO CAPS: 50000.0000 [IU] | ORAL_CAPSULE | ORAL | 0 refills | Status: DC

## 2018-03-25 MED ORDER — BUPROPION HCL ER (SR) 200 MG PO TB12: 200.0000 mg | ORAL_TABLET | Freq: Two times a day (BID) | ORAL | 0 refills | Status: DC

## 2018-03-25 MED FILL — VIT D2 1.25 MG (50,000 UNIT: 1.25 MG | 28 days supply | Qty: 4 | Fill #0

## 2018-03-25 MED FILL — BUPROPION HCL SR 200 MG TAB: 200 | 30 days supply | Qty: 60 | Fill #0

## 2018-03-25 NOTE — Progress Notes (Signed)
Office: [phone]  /  Fax: [phone]   HPI:   Chief Complaint: OBESITY Laura Brown is here to discuss her progress with her obesity treatment plan. She is on the Category 2 plan + 100 calories and is following her eating plan approximately 95 % of the time. She states she is being more mindful of steps during the week. Laura Brown continues to do well with weight loss and journaling. She is on Saxenda @ 0.6mg  and feels this is significantly helping her hunger.  Her weight is 280 lb (127 kg) today and has had a weight loss of 7 pounds over a period of 3 weeks since her last visit. She has lost 16 lbs since starting treatment with [place].  Depression with emotional eating behaviors Laura Brown is on Wellbutrin and her dose was increased to  BID and she feels well on this dose. Her blood pressure is slightly higher today. She denies chest pain, insomnia or headache. She has been working on behavior modification techniques to help reduce her emotional eating and has been somewhat successful.   Vitamin D deficiency Laura Brown has a diagnosis of vitamin D deficiency. She is currently stable on vit D.  At risk for cardiovascular disease Laura Brown is at a higher than average risk for cardiovascular disease due to vitamin D deficiency and obesity. She currently denies any chest pain.  ASSESSMENT AND PLAN:  Vitamin D deficiency - Plan: Vitamin D, Ergocalciferol, (DRISDOL) 1.25 MG (50000 UT) CAPS capsule  Other depression - Plan: buPROPion (WELLBUTRIN SR) 200 MG 12 hr tablet  At risk for heart disease  Class 3 severe obesity with serious comorbidity and body mass index (BMI) of 45.0 to 49.9 in adult, unspecified obesity type (HCC)  PLAN:  Vitamin D Deficiency Laura Brown was informed that low vitamin D levels contributes to fatigue and are associated with obesity, breast, and colon cancer. Laura Brown agrees to continue to take prescription Vit D ,000 IU every week #4 with no refills and will follow up for  routine testing of vitamin D, at least 2-3 times per year. She was informed of the risk of over-replacement of vitamin D and agrees to not increase her dose unless she discusses this with [place] first. Laura Brown agrees to follow up in 3 to 4 weeks as directed.  Cardiovascular risk counseling Laura Brown was given extended (15 minutes) coronary artery disease prevention counseling today. She is 48 y.o. female and has risk factors for heart disease including vitamin D deficiency and obesity. We discussed intensive lifestyle modifications today with an emphasis on specific weight loss instructions and strategies. Pt was also informed of the importance of increasing exercise and decreasing saturated fats to help prevent heart disease.  Depression with Emotional Eating Behaviors We discussed behavior modification techniques today to help Laura Brown deal with her emotional eating and depression. She has agreed to take Wellbutrin SR  BID #60 with no refills and agreed to follow up as directed.  Obesity Laura Brown is currently in the action stage of change. As such, her goal is to continue with weight loss efforts. She has agreed to follow the Category 2 plan + 100 calories. Laura Brown has been instructed to work up to a goal of 150 minutes of combined cardio and strengthening exercise per week for weight loss and overall health benefits. We discussed the following Behavioral Modification Strategies today: increasing lean protein intake, decreasing simple carbohydrates, and work on meal planning and easy cooking plans.  Laura Brown has agreed to follow up with our clinic in 3  to 4 weeks. She was informed of the importance of frequent follow up visits to maximize her success with intensive lifestyle modifications for her multiple health conditions.  ALLERGIES: No Known Allergies  MEDICATIONS: Current Outpatient Medications on File Prior to Visit  Medication Sig Dispense Refill  . buPROPion (WELLBUTRIN SR) 200 MG 12 hr tablet  Take 1 tablet (200 mg total) by mouth 2 (two) times daily. 60 tablet 0  . DULoxetine (CYMBALTA) 60 MG capsule Take 60 mg by mouth 2 (two) times daily.    . fexofenadine (ALLEGRA) 180 MG tablet Take 180 mg by mouth daily.    . Insulin Pen Needle (BD PEN NEEDLE NANO 2ND GEN) 32G X 4 MM MISC 1 Device by Does not apply route daily. 100 each 0  . Liraglutide -Weight Management (SAXENDA) 18 MG/3ML SOPN Inject 3 mg into the skin daily. 5 pen 0  . verapamil (VERELAN) 100 MG 24 hr capsule Take 200 mg by mouth at bedtime.    . Vitamin D, Ergocalciferol, (DRISDOL) 1.25 MG (50000 UT) CAPS capsule Take 1 capsule (50,000 Units total) by mouth every 7 (seven) days. 4 capsule 0   No current facility-administered medications on file prior to visit.     PAST MEDICAL HISTORY: Past Medical History:  Diagnosis Date  . Anxiety   . Cluster headaches   . Depression   . Migraines   . Osteoarthritis of both knees     PAST SURGICAL HISTORY: Past Surgical History:  Procedure Laterality Date  . CESAREAN SECTION      SOCIAL HISTORY: Social History   Tobacco Use  . Smoking status: Never Smoker  . Smokeless tobacco: Never Used  Substance Use Topics  . Alcohol use: Not on file  . Drug use: Not on file    FAMILY HISTORY: Family History  Problem Relation Age of Onset  . Hyperlipidemia Mother   . Hyperlipidemia Father   . Hypertension Father     ROS: Review of Systems  Cardiovascular: Negative for chest pain.  Neurological: Negative for headaches.  Psychiatric/Behavioral: Positive for depression. The patient does not have insomnia.     PHYSICAL EXAM: Blood pressure 137/78, pulse 83, temperature 98 F (36.7 C), temperature source Oral, height 5\' 6"  (1.676 m), weight 280 lb (127 kg), SpO2 100 %. Body mass index is 45.19 kg/m. Physical Exam Vitals signs reviewed.  Constitutional:      Appearance: Normal appearance. She is obese.  Cardiovascular:     Rate and Rhythm: Normal rate.  Pulmonary:       Effort: Pulmonary effort is normal.  Musculoskeletal: Normal range of motion.  Skin:    General: Skin is warm and dry.  Neurological:     Mental Status: She is alert and oriented to person, place, and time.  Psychiatric:        Mood and Affect: Mood normal.        Behavior: Behavior normal.     RECENT LABS AND TESTS: BMET    Component Value Date/Time   NA 140 11/07/2017 0901   NA 143 03/22/2011 1014   K 4.5 11/07/2017 0901   K 3.9 03/22/2011 1014   CL 100 11/07/2017 0901   CL 107 03/22/2011 1014   CO2 26 11/07/2017 0901   CO2 26 03/22/2011 1014   GLUCOSE 68 11/07/2017 0901   GLUCOSE 77 03/22/2011 1014   BUN 10 11/07/2017 0901   BUN 14 03/22/2011 1014   CREATININE 0.66 11/07/2017 0901   CREATININE 0.73 03/22/2011 1014  CALCIUM 9.1 11/07/2017 0901   CALCIUM 8.7 03/22/2011 1014   GFRNONAA 104 11/07/2017 0901   GFRNONAA >60 03/22/2011 1014   GFRAA 120 11/07/2017 0901   GFRAA >60 03/22/2011 1014   Lab Results  Component Value Date   HGBA1C 4.9 11/07/2017   Lab Results  Component Value Date   INSULIN 6.7 11/07/2017   CBC    Component Value Date/Time   WBC 6.8 11/07/2017 0901   WBC 6.3 03/22/2011 1014   RBC 4.73 11/07/2017 0901   RBC 4.70 03/22/2011 1014   HGB 14.8 11/07/2017 0901   HCT 43.6 11/07/2017 0901   PLT 277 03/22/2011 1014   MCV 92 11/07/2017 0901   MCV 93 03/22/2011 1014   MCH 31.3 11/07/2017 0901   MCH 30.5 03/22/2011 1014   MCHC 33.9 11/07/2017 0901   MCHC 32.8 03/22/2011 1014   RDW 11.2 (L) 11/07/2017 0901   RDW 11.3 (L) 03/22/2011 1014   LYMPHSABS 2.4 11/07/2017 0901   LYMPHSABS 2.0 03/22/2011 1014   MONOABS 0.3 03/22/2011 1014   EOSABS 0.1 11/07/2017 0901   EOSABS 0.1 03/22/2011 1014   BASOSABS 0.0 11/07/2017 0901   BASOSABS 0.0 03/22/2011 1014   Iron/TIBC/Ferritin/ %Sat    Component Value Date/Time   FERRITIN 32 03/22/2011 1014   Lipid Panel     Component Value Date/Time   CHOL 208 (H) 11/07/2017 0901   TRIG 71 11/07/2017  0901   HDL 63 11/07/2017 0901   LDLCALC 131 (H) 11/07/2017 0901   Hepatic Function Panel     Component Value Date/Time   PROT 7.1 11/07/2017 0901   PROT 7.4 03/22/2011 1014   ALBUMIN 4.2 11/07/2017 0901   ALBUMIN 3.6 03/22/2011 1014   AST 13 11/07/2017 0901   AST 21 03/22/2011 1014   ALT 12 11/07/2017 0901   ALT 15 03/22/2011 1014   ALKPHOS 61 11/07/2017 0901   ALKPHOS 54 03/22/2011 1014   BILITOT 0.3 11/07/2017 0901   BILITOT 0.5 03/22/2011 1014      Component Value Date/Time   TSH 0.984 11/07/2017 0901   TSH 1.66 03/22/2011 1014   Results for KIYOKO, RAMPTON (MRN [id number]) as of 03/25/2018 14:19  Ref. Range 11/07/2017 09:01  Vitamin D, 25-Hydroxy Latest Ref Range: 30.0 - 100.0 ng/mL 19.1 (L)    OBESITY BEHAVIORAL INTERVENTION VISIT  Today's visit was # 8   Starting weight: 296 lbs Starting date: 11/07/17 Today's weight : Weight: 280 lb (127 kg)  Today's date: 03/25/2018 Total lbs lost to date: 16    03/25/2018  Height 5\' 6"  (1.676 m)  Weight 280 lb (127 kg)  BMI (Calculated) 45.21  BLOOD PRESSURE - SYSTOLIC 137  BLOOD PRESSURE - DIASTOLIC 78   Body Fat % 54.3 %  Total Body Water (lbs) 95.8 lbs   ASK: We discussed the diagnosis of obesity with Laura Brown today and Laura Brown agreed to give [place] permission to discuss obesity behavioral modification therapy today.  ASSESS: Siniya has the diagnosis of obesity and her BMI today is 45.21. Emelin is in the action stage of change.   ADVISE: Jensine was educated on the multiple health risks of obesity as well as the benefit of weight loss to improve her health. She was advised of the need for long term treatment and the importance of lifestyle modifications to improve her current health and to decrease her risk of future health problems.  AGREE: Multiple dietary modification options and treatment options were discussed and Petrice agreed to follow the  recommendations documented in the above note.  ARRANGE: Alasha  was educated on the importance of frequent visits to treat obesity as outlined per CMS and USPSTF guidelines and agreed to schedule her next follow up appointment today.  IKirke Corin, CMA, am acting as transcriptionist for Wilder Glade, MD  I have reviewed the above documentation for accuracy and completeness, and I agree with the above. -Quillian Quince, MD

## 2018-04-01 ENCOUNTER — Encounter (INDEPENDENT_AMBULATORY_CARE_PROVIDER_SITE_OTHER): Payer: Self-pay

## 2018-04-08 ENCOUNTER — Encounter (INDEPENDENT_AMBULATORY_CARE_PROVIDER_SITE_OTHER): Payer: Self-pay | Admitting: Family Medicine

## 2018-04-11 MED FILL — AZELASTINE HCL 137 MCG SPRY: 0.1 | 25 days supply | Qty: 30 | Fill #1

## 2018-04-11 MED FILL — VERAPAMIL ER PM 100 MG CAP: 100 | 90 days supply | Qty: 180 | Fill #0

## 2018-04-11 MED FILL — DULoxetine HCL 60 MG CPEP: 60 | 60 days supply | Qty: 120 | Fill #1 | Status: TO

## 2018-04-16 ENCOUNTER — Ambulatory Visit (INDEPENDENT_AMBULATORY_CARE_PROVIDER_SITE_OTHER): Payer: Self-pay | Admitting: Family Medicine

## 2018-04-16 ENCOUNTER — Encounter (INDEPENDENT_AMBULATORY_CARE_PROVIDER_SITE_OTHER): Payer: Self-pay

## 2018-04-24 ENCOUNTER — Encounter (INDEPENDENT_AMBULATORY_CARE_PROVIDER_SITE_OTHER): Payer: Self-pay

## 2018-05-05 ENCOUNTER — Encounter (INDEPENDENT_AMBULATORY_CARE_PROVIDER_SITE_OTHER): Payer: Self-pay | Admitting: Family Medicine

## 2018-05-05 ENCOUNTER — Other Ambulatory Visit (INDEPENDENT_AMBULATORY_CARE_PROVIDER_SITE_OTHER): Payer: Self-pay | Admitting: Family Medicine

## 2018-05-05 DIAGNOSIS — Z6841 Body Mass Index (BMI) 40.0 and over, adult: Principal | ICD-10-CM

## 2018-05-05 DIAGNOSIS — F3289 Other specified depressive episodes: Secondary | ICD-10-CM

## 2018-05-05 DIAGNOSIS — E559 Vitamin D deficiency, unspecified: Secondary | ICD-10-CM

## 2018-05-06 ENCOUNTER — Encounter (INDEPENDENT_AMBULATORY_CARE_PROVIDER_SITE_OTHER): Payer: Self-pay | Admitting: Family Medicine

## 2018-05-06 ENCOUNTER — Other Ambulatory Visit: Payer: Self-pay

## 2018-05-06 ENCOUNTER — Other Ambulatory Visit (INDEPENDENT_AMBULATORY_CARE_PROVIDER_SITE_OTHER): Payer: Self-pay | Admitting: Family Medicine

## 2018-05-06 ENCOUNTER — Ambulatory Visit (INDEPENDENT_AMBULATORY_CARE_PROVIDER_SITE_OTHER): Payer: 59 | Admitting: Family Medicine

## 2018-05-06 DIAGNOSIS — E559 Vitamin D deficiency, unspecified: Secondary | ICD-10-CM | POA: Diagnosis not present

## 2018-05-06 DIAGNOSIS — Z6841 Body Mass Index (BMI) 40.0 and over, adult: Secondary | ICD-10-CM | POA: Diagnosis not present

## 2018-05-06 DIAGNOSIS — F3289 Other specified depressive episodes: Secondary | ICD-10-CM

## 2018-05-06 MED ORDER — LIRAGLUTIDE -WEIGHT MANAGEMENT 18 MG/3ML ~~LOC~~ SOPN: 3.0000 mg | PEN_INJECTOR | Freq: Every day | SUBCUTANEOUS | 0 refills | Status: DC

## 2018-05-06 MED ORDER — BUPROPION HCL ER (SR) 200 MG PO TB12: 200.0000 mg | ORAL_TABLET | Freq: Two times a day (BID) | ORAL | 0 refills | Status: DC

## 2018-05-06 MED ORDER — VITAMIN D (ERGOCALCIFEROL) 1.25 MG (50000 UNIT) PO CAPS: 50000.0000 [IU] | ORAL_CAPSULE | ORAL | 0 refills | Status: DC

## 2018-05-06 MED FILL — SAXENDA 18 MG/3 ML PEN: 18 | 30 days supply | Qty: 15 | Fill #0

## 2018-05-06 MED FILL — VIT D2 1.25 MG (50,000 UNIT: 1.25 MG | 28 days supply | Qty: 4 | Fill #0

## 2018-05-06 MED FILL — BUPROPION HCL SR 200 MG TAB: 200 | 30 days supply | Qty: 60 | Fill #0

## 2018-05-06 NOTE — Progress Notes (Signed)
Office: [phone]  /  Fax: [phone] TeleHealth Visit:  Laura Brown has verbally consented to this TeleHealth visit today. The patient is located at home, the provider is located at the [facility] office. The participants in this visit include the listed provider and patient. Laura Brown was unable to use Webex today and the Telehealth visit was conducted via telephone.  HPI:   Chief Complaint: OBESITY Laura Brown is here to discuss her progress with her obesity treatment plan. She is on the Category 2 plan and is following her eating plan approximately 90 % of the time. She states she is walking 40 minutes 5 times per week. [name] feels that she is doing well with weight loss and that her clothes are fitting looser. She is doing well with meal planning and prepping while still eating her meals on time. Laura Brown states that her hunger is controlled.  We were unable to weigh the patient today for this TeleHealth visit. She feels as if she has lost weight since her last visit. She has lost 16 lbs since starting treatment with [place].  Depression with emotional eating behaviors Laura Brown mood is stable. She is sleeping well and waking up refreshed. Laura Brown notes reduced irritability and feels more in control of her emotional eating and using food for comfort to the extent that it is negatively impacting her health. She often snacks when she is not hungry. She has been working on behavior modification techniques to help reduce her emotional eating and has been somewhat successful.   Vitamin D Deficiency Laura Brown has a diagnosis of vitamin D deficiency. She is currently stable on vit D. Laura Brown admits fatigue and denies nausea, vomiting, or muscle weakness.  ASSESSMENT AND PLAN:  Vitamin D deficiency - Plan: Vitamin D, Ergocalciferol, (DRISDOL) 1.25 MG (50000 UT) CAPS capsule  Other depression - Plan: buPROPion (WELLBUTRIN SR) 200 MG 12 hr tablet  Class 3 severe obesity with serious  comorbidity and body mass index (BMI) of 45.0 to 49.9 in adult, unspecified obesity type (HCC) - Plan: Liraglutide -Weight Management (SAXENDA) 18 MG/3ML SOPN  PLAN:  Vitamin D Deficiency Laura Brown was informed that low vitamin D levels contribute to fatigue and are associated with obesity, breast, and colon cancer. Laura Brown agrees to continue to take prescription Vit D @50 ,000 IU every week #4 with no refills and will follow up for routine testing of vitamin D, at least 2-3 times per year. She was informed of the risk of over-replacement of vitamin D and agrees to not increase her dose unless she discusses this with [place] first. Laura Brown agrees to follow up in 3 weeks as directed.  Depression with Emotional Eating Behaviors We discussed behavior modification techniques today to help Laura Brown deal with her emotional eating and depression. She has agreed to take Wellbutrin SR 200 mg BID # 60 with no refills and agreed to follow up as directed.  Obesity Laura Brown is currently in the action stage of change. As such, her goal is to continue with weight loss efforts. She has agreed to follow the Category 2 plan + 100 calories. Laura Brown has been instructed to work up to a goal of 150 minutes of combined cardio and strengthening exercise per week for weight loss and overall health benefits. We discussed the following Behavioral Modification Strategies today: work on meal planning and easy cooking plans, planning for success, and emotional eating strategies. We discussed various medication options to help Laura Brown with her weight loss efforts and we both agreed to increase  Saxenda from 1.2 mg to 3 mg qd #5 pens with no refills.  Laura Brown has agreed to follow up with our clinic in 3 weeks. She was informed of the importance of frequent follow up visits to maximize her success with intensive lifestyle modifications for her multiple health conditions.  ALLERGIES: No Known Allergies  MEDICATIONS: Current Outpatient  Medications on File Prior to Visit  Medication Sig Dispense Refill  . DULoxetine (CYMBALTA) 60 MG capsule Take 60 mg by mouth 2 (two) times daily.    . fexofenadine (ALLEGRA) 180 MG tablet Take 180 mg by mouth daily.    . Insulin Pen Needle (BD PEN NEEDLE NANO 2ND GEN) 32G X 4 MM MISC 1 Device by Does not apply route daily. 100 each 0  . verapamil (VERELAN) 100 MG 24 hr capsule Take 200 mg by mouth at bedtime.     No current facility-administered medications on file prior to visit.     PAST MEDICAL HISTORY: Past Medical History:  Diagnosis Date  . Anxiety   . Cluster headaches   . Depression   . Migraines   . Osteoarthritis of both knees     PAST SURGICAL HISTORY: Past Surgical History:  Procedure Laterality Date  . CESAREAN SECTION      SOCIAL HISTORY: Social History   Tobacco Use  . Smoking status: Never Smoker  . Smokeless tobacco: Never Used  Substance Use Topics  . Alcohol use: Not on file  . Drug use: Not on file    FAMILY HISTORY: Family History  Problem Relation Age of Onset  . Hyperlipidemia Mother   . Hyperlipidemia Father   . Hypertension Father     ROS: Review of Systems  Constitutional: Positive for malaise/fatigue.  Gastrointestinal: Negative for nausea and vomiting.  Musculoskeletal:       Negative for muscle weakness.  Psychiatric/Behavioral: Positive for depression.    PHYSICAL EXAM: Pt in no acute distress  RECENT LABS AND TESTS: BMET    Component Value Date/Time   NA 140 11/07/2017 0901   NA 143 03/22/2011 1014   K 4.5 11/07/2017 0901   K 3.9 03/22/2011 1014   CL 100 11/07/2017 0901   CL 107 03/22/2011 1014   CO2 26 11/07/2017 0901   CO2 26 03/22/2011 1014   GLUCOSE 68 11/07/2017 0901   GLUCOSE 77 03/22/2011 1014   BUN 10 11/07/2017 0901   BUN 14 03/22/2011 1014   CREATININE 0.66 11/07/2017 0901   CREATININE 0.73 03/22/2011 1014   CALCIUM 9.1 11/07/2017 0901   CALCIUM 8.7 03/22/2011 1014   GFRNONAA 104 11/07/2017 0901    GFRNONAA >60 03/22/2011 1014   GFRAA 120 11/07/2017 0901   GFRAA >60 03/22/2011 1014   Lab Results  Component Value Date   HGBA1C 4.9 11/07/2017   Lab Results  Component Value Date   INSULIN 6.7 11/07/2017   CBC    Component Value Date/Time   WBC 6.8 11/07/2017 0901   WBC 6.3 03/22/2011 1014   RBC 4.73 11/07/2017 0901   RBC 4.70 03/22/2011 1014   HGB 14.8 11/07/2017 0901   HCT 43.6 11/07/2017 0901   PLT 277 03/22/2011 1014   MCV 92 11/07/2017 0901   MCV 93 03/22/2011 1014   MCH 31.3 11/07/2017 0901   MCH 30.5 03/22/2011 1014   MCHC 33.9 11/07/2017 0901   MCHC 32.8 03/22/2011 1014   RDW 11.2 (L) 11/07/2017 0901   RDW 11.3 (L) 03/22/2011 1014   LYMPHSABS 2.4 11/07/2017 0901   LYMPHSABS  2.0 03/22/2011 1014   MONOABS 0.3 03/22/2011 1014   EOSABS 0.1 11/07/2017 0901   EOSABS 0.1 03/22/2011 1014   BASOSABS 0.0 11/07/2017 0901   BASOSABS 0.0 03/22/2011 1014   Iron/TIBC/Ferritin/ %Sat    Component Value Date/Time   FERRITIN 32 03/22/2011 1014   Lipid Panel     Component Value Date/Time   CHOL 208 (H) 11/07/2017 0901   TRIG 71 11/07/2017 0901   HDL 63 11/07/2017 0901   LDLCALC 131 (H) 11/07/2017 0901   Hepatic Function Panel     Component Value Date/Time   PROT 7.1 11/07/2017 0901   PROT 7.4 03/22/2011 1014   ALBUMIN 4.2 11/07/2017 0901   ALBUMIN 3.6 03/22/2011 1014   AST 13 11/07/2017 0901   AST 21 03/22/2011 1014   ALT 12 11/07/2017 0901   ALT 15 03/22/2011 1014   ALKPHOS 61 11/07/2017 0901   ALKPHOS 54 03/22/2011 1014   BILITOT 0.3 11/07/2017 0901   BILITOT 0.5 03/22/2011 1014      Component Value Date/Time   TSH 0.984 11/07/2017 0901   TSH 1.66 03/22/2011 1014   Results for OONA, TRAMMEL (MRN [id number]) as of 05/06/2018 12:43  Ref. Range 11/07/2017 09:01  Vitamin D, 25-Hydroxy Latest Ref Range: 30.0 - 100.0 ng/mL 19.1 (L)    I, Kirke Corin, CMA, am acting as transcriptionist for Wilder Glade, MD I have reviewed the above documentation for  accuracy and completeness, and I agree with the above. -Quillian Quince, MD

## 2018-05-07 ENCOUNTER — Telehealth (INDEPENDENT_AMBULATORY_CARE_PROVIDER_SITE_OTHER): Payer: Self-pay | Admitting: Psychology

## 2018-05-07 NOTE — Telephone Encounter (Signed)
  Office: [phone]  /  Fax: [phone]  Date of Call: May 07, 2018 Time of Call: 9:57am Duration of Call: ~ 1 minute Provider: Lawerance Cruel, PsyD  CONTENT: This provider called Marylene Land to check-in and schedule an appointment. Cici reported she has a meeting today from 10:00am to 11:00am today, and requested this provider call back.  PLAN: This provider will call Sabrie back today after 11:00am.

## 2018-05-07 NOTE — Telephone Encounter (Signed)
  Office: [phone]  /  Fax: [phone]  Date of Call: May 07, 2018 Time of Call: 11:09am Duration of Call: 8 minutes Provider: Lawerance Cruel, PsyD  CONTENT: This provider called Saphire back to check-in and schedule an appointment. Marizela shared she is working from home and has established a "routine" for herself, which includes an increase in physical activity. A brief risk assessment was completed. Caylin denied experiencing suicidal and homicidal ideation, plan, and intent since the last appointment with this provider.  PLAN: Nyla is scheduled for an appointment with this provider on May 21, 2018 at 9:30am via [employer]. An e-mail will be sent with a secure link for the WebEx appointment. Taneja acknowledged understanding the e-mail will also include an informed consent form for telepsychological services. She further acknowledged understanding that she may return the completed document via a MyChart message, which will be a part of her medical record; therefore, visible to all provides.

## 2018-05-21 ENCOUNTER — Telehealth (INDEPENDENT_AMBULATORY_CARE_PROVIDER_SITE_OTHER): Payer: Self-pay | Admitting: Psychology

## 2018-05-21 ENCOUNTER — Encounter (INDEPENDENT_AMBULATORY_CARE_PROVIDER_SITE_OTHER): Payer: Self-pay

## 2018-05-21 ENCOUNTER — Ambulatory Visit (INDEPENDENT_AMBULATORY_CARE_PROVIDER_SITE_OTHER): Payer: Self-pay | Admitting: Psychology

## 2018-05-21 NOTE — Progress Notes (Unsigned)
  Office: [phone]  /  Fax: [phone]    Date: May 21, 2018   Appointment Start Time:*** Duration:*** Provider: Lawerance Cruel, Psy.D. Type of Session: Individual Therapy  Location of Patient: *** Location of Provider: Home  Type of Contact: Telepsychological Visit via [employer] WebEx ***  Session Content:Prior to initiating telepsychological services, Laura Brown was provided with an informed consent document via e-mail, which included the development of a safety plan (I.e., an emergency contact and emergency resources) in the event of an emergency/crisis.   Laura Brown was unable to complete the form and return it to this provider prior to today's appointment. As such, this provider verbally discussed the consent form during today's appointment. Laura Brown provided an emergency contact, and verbally acknowledged understanding that she is ultimately responsible for understanding her insurance benefits as it relates to reimbursement of telepsychological services. This provider also reviewed confidentiality, as it relates to telepsychological services, as well as the rationale for telepsychological services. More specifically, this provider's clinic is closed for in-person visits due to COVID-19. Therapeutic services will resume to in-person appointments once the clinic re-opens. Laura Brown expressed understanding regarding the rationale for telepsychological services. In addition, this provider explained the telepsychological services informed consent document would be considered an addendum to the initial consent document. Laura Brown verbally consented to proceed.    Laura Brown is a 48 y.o. female presenting via [employer] WebEx for a follow-up appointment to address the previously established treatment goal of decreasing emotional eating. Prior to proceeding with today's appointment, Laura Brown's physical location at the time of this appointment was obtained. Laura Brown reported she was at *** and provided the address. In the event  of technical difficulties, Laura Brown shared a phone number she could be reached at. Laura Brown and this provider participated in today's telepsychological service. Also, Laura Brown denied anyone else being present in the room or on the WebEx appointment ***. This provider verbally administered the PHQ-9 and GAD-7, and conducted a brief check-in. ***   Laura Brown was receptive to today's session as evidenced by openness to sharing, responsiveness to feedback, and ***.  Mental Status Examination:  Appearance: {Appearance:22431} Behavior: {Behavior:22445} Mood: {Teletherapy mood:22435} Affect: {Affect:22436} Speech: {Speech:22432} Eye Contact: {Eye Contact:22433} Psychomotor Activity: {Motor Activity:22434} Thought Process: {thought process:22448}  Content/Perceptual Disturbances: {disturbances:22451} Orientation: {Orientation:22437} Cognition/Sensorium: {gbcognition:22449} Insight: {Insight:22446} Judgment: {Insight:22446}  Structured Assessment Results: The Patient Health Questionnaire-9 (PHQ-9) is a self-report measure that assesses symptoms and severity of depression over the course of the last two weeks. Laura Brown obtained a score of *** suggesting {GBPHQ9SEVERITY:21752}. Laura Brown finds the endorsed symptoms to be {gbphq9difficulty:21754}.  The Generalized Anxiety Disorder-7 (GAD-7) is a brief self-report measure that assesses symptoms of anxiety over the course of the last two weeks. Laura Brown obtained a score of *** suggesting {gbgad7severity:21753}.  Interventions:  {Interventions:22172}  DSM-5 Diagnosis: 296.31 (F33.0) Major Depressive Disorder, Recurrent Episode, Mild, With Anxious Distress, Mild  Treatment Goal & Progress: During the initial appointment with this provider, the following treatment goal was established: decrease emotional eating. Laura Brown has demonstrated progress in her goal as evidenced by ***  Plan: Laura Brown continues to appear able and willing to participate as evidenced by engagement  in reciprocal conversation, and asking questions for clarification as appropriate. The next appointment will be scheduled in {gbweeks:21758}, which will be via [employer]. Once this provider's office resumes in-person appointments, Laura Brown will be notified. The next session will focus on reviewing learned skills, and working towards the established treatment goal.***

## 2018-05-21 NOTE — Telephone Encounter (Signed)
  Office: [phone]  /  Fax: [phone]  Date of Call: May 21, 2018 Time of Call: 9:32am Provider: Lawerance Cruel, PsyD  CONTENT: This provider called Marylene Land, as she did not present for her WebEx appointment at 9:30am. A HIPAA compliant voicemail was left requesting a call back.   PLAN: This provider will wait for Nicolett to call back or send a MyChart message to reschedule the appointment.

## 2018-05-27 ENCOUNTER — Ambulatory Visit (INDEPENDENT_AMBULATORY_CARE_PROVIDER_SITE_OTHER): Payer: 59 | Admitting: Family Medicine

## 2018-05-27 ENCOUNTER — Telehealth (INDEPENDENT_AMBULATORY_CARE_PROVIDER_SITE_OTHER): Payer: Self-pay | Admitting: Psychology

## 2018-05-27 ENCOUNTER — Encounter (INDEPENDENT_AMBULATORY_CARE_PROVIDER_SITE_OTHER): Payer: Self-pay | Admitting: Family Medicine

## 2018-05-27 ENCOUNTER — Other Ambulatory Visit: Payer: Self-pay

## 2018-05-27 DIAGNOSIS — Z6841 Body Mass Index (BMI) 40.0 and over, adult: Secondary | ICD-10-CM

## 2018-05-27 DIAGNOSIS — F3289 Other specified depressive episodes: Secondary | ICD-10-CM | POA: Diagnosis not present

## 2018-05-27 DIAGNOSIS — E559 Vitamin D deficiency, unspecified: Secondary | ICD-10-CM | POA: Diagnosis not present

## 2018-05-27 MED ORDER — BUPROPION HCL ER (SR) 200 MG PO TB12: 200.0000 mg | ORAL_TABLET | Freq: Two times a day (BID) | ORAL | 0 refills | Status: DC

## 2018-05-27 MED ORDER — VITAMIN D (ERGOCALCIFEROL) 1.25 MG (50000 UNIT) PO CAPS: 50000.0000 [IU] | ORAL_CAPSULE | ORAL | 0 refills | Status: DC

## 2018-05-27 NOTE — Progress Notes (Signed)
Office: [phone]  /  Fax: [phone] TeleHealth Visit:  Laura Brown has verbally consented to this TeleHealth visit today. The patient is located at home, the provider is located at the [facility] office. The participants in this visit include the listed provider and patient. Kaylianna was unable to use Webex today and the telehealth visit was conducted via telephone.  HPI:   Chief Complaint: OBESITY Laura Brown is here to discuss her progress with her obesity treatment plan. She is on the Category 2 plan and is following her eating plan approximately 90 % of the time. She states she is walking 15 to 20 minutes 3 times per week. Laura Brown feels that she has done well on her Category 2 plan. She thinks she has lost another 2 pounds. She is getting bored with her Category 2 plan and has made some substitutions that may be reducing her lean protein totals.  We were unable to weigh the patient today for this TeleHealth visit. She feels as if she has lost weight since her last visit. She has lost 16 lbs since starting treatment with [place].  Vitamin D Deficiency Laura Brown has a diagnosis of vitamin D deficiency. She is currently stable on vit D. Laura Brown denies nausea, vomiting, or muscle weakness.  Depression with emotional eating behaviors Laura Brown's mood is stable on Wellbutrin and she feels that she is doing better with emotional eating and using food for comfort to the extent that it is negatively impacting her health. She has been working on behavior modification techniques to help reduce her emotional eating and has been somewhat successful. She denies palpitations or insomnia.  Depression screen [facility] 2/9 02/24/2018 02/03/2018 12/23/2017 12/05/2017 11/20/2017  Decreased Interest 0 0 0 0 1  Down, Depressed, Hopeless 0 0 0 0 1  PHQ - 2 Score 0 0 0 0 2  Altered sleeping 0 1 0 0 0  Tired, decreased energy 0 0 0 1 2  Change in appetite 0 0 0 0 1  Feeling bad or failure about yourself  0 1 0 0  1  Trouble concentrating 0 0 0 0 0  Moving slowly or fidgety/restless 0 0 0 0 0  Suicidal thoughts 0 0 0 0 0  PHQ-9 Score 0 2 0 1 6  Difficult doing work/chores - - - - -   ASSESSMENT AND PLAN:  Vitamin D deficiency - Plan: Vitamin D, Ergocalciferol, (DRISDOL) 1.25 MG (50000 UT) CAPS capsule  Other depression - with emotional eating - Plan: buPROPion (WELLBUTRIN SR) 200 MG 12 hr tablet  Class 3 severe obesity with serious comorbidity and body mass index (BMI) of 45.0 to 49.9 in adult, unspecified obesity type (HCC)  PLAN:  Vitamin D Deficiency Laura Brown was informed that low vitamin D levels contribute to fatigue and are associated with obesity, breast, and colon cancer. Laura Brown agrees to continue to take prescription Vit D ,000 IU every week #4 with no refills and will follow up for routine testing of vitamin D, at least 2-3 times per year. She was informed of the risk of over-replacement of vitamin D and agrees to not increase her dose unless she discusses this with [place] first. Laura Brown agrees to follow up in 2 weeks as directed.  Depression with Emotional Eating Behaviors We discussed behavior modification techniques today to help Aynslee deal with her emotional eating and depression. She has agreed to take Wellbutrin SR 200 mg BID #60 with no refills and agreed to follow up as directed.  Obesity Laura Brown is currently in the action stage of change. As such, her goal is to continue with weight loss efforts. She has agreed to follow the Category 2 plan. Laura Brown has been instructed to work up to a goal of 150 minutes of combined cardio and strengthening exercise per week for weight loss and overall health benefits. We discussed the following Behavioral Modification Strategies today: increasing lean protein intake, keeping healthy foods in the home, better snacking choices, keep a strict food journal, and ways to avoid boredom eating.  Laura Brown has agreed to follow up with our clinic in 2 weeks.  She was informed of the importance of frequent follow up visits to maximize her success with intensive lifestyle modifications for her multiple health conditions.  ALLERGIES: No Known Allergies  MEDICATIONS: Current Outpatient Medications on File Prior to Visit  Medication Sig Dispense Refill  . DULoxetine (CYMBALTA) 60 MG capsule Take 60 mg by mouth 2 (two) times daily.    . fexofenadine (ALLEGRA) 180 MG tablet Take 180 mg by mouth daily.    . Insulin Pen Needle (BD PEN NEEDLE NANO 2ND GEN) 32G X 4 MM MISC 1 Device by Does not apply route daily. 100 each 0  . Liraglutide -Weight Management (SAXENDA) 18 MG/3ML SOPN Inject 3 mg into the skin daily. 5 pen 0  . verapamil (VERELAN) 100 MG 24 hr capsule Take 200 mg by mouth at bedtime.     No current facility-administered medications on file prior to visit.     PAST MEDICAL HISTORY: Past Medical History:  Diagnosis Date  . Anxiety   . Cluster headaches   . Depression   . Migraines   . Osteoarthritis of both knees     PAST SURGICAL HISTORY: Past Surgical History:  Procedure Laterality Date  . CESAREAN SECTION      SOCIAL HISTORY: Social History   Tobacco Use  . Smoking status: Never Smoker  . Smokeless tobacco: Never Used  Substance Use Topics  . Alcohol use: Not on file  . Drug use: Not on file    FAMILY HISTORY: Family History  Problem Relation Age of Onset  . Hyperlipidemia Mother   . Hyperlipidemia Father   . Hypertension Father     ROS: Review of Systems  Cardiovascular: Negative for palpitations.  Gastrointestinal: Negative for nausea and vomiting.  Musculoskeletal:       Negative for muscle weakness.  Psychiatric/Behavioral: Positive for depression. The patient does not have insomnia.     PHYSICAL EXAM: Pt in no acute distress  RECENT LABS AND TESTS: BMET    Component Value Date/Time   NA 140 11/07/2017 0901   NA 143 03/22/2011 1014   K 4.5 11/07/2017 0901   K 3.9 03/22/2011 1014   CL 100  11/07/2017 0901   CL 107 03/22/2011 1014   CO2 26 11/07/2017 0901   CO2 26 03/22/2011 1014   GLUCOSE 68 11/07/2017 0901   GLUCOSE 77 03/22/2011 1014   BUN 10 11/07/2017 0901   BUN 14 03/22/2011 1014   CREATININE 0.66 11/07/2017 0901   CREATININE 0.73 03/22/2011 1014   CALCIUM 9.1 11/07/2017 0901   CALCIUM 8.7 03/22/2011 1014   GFRNONAA 104 11/07/2017 0901   GFRNONAA >60 03/22/2011 1014   GFRAA 120 11/07/2017 0901   GFRAA >60 03/22/2011 1014   Lab Results  Component Value Date   HGBA1C 4.9 11/07/2017   Lab Results  Component Value Date   INSULIN 6.7 11/07/2017   CBC    Component  Value Date/Time   WBC 6.8 11/07/2017 0901   WBC 6.3 03/22/2011 1014   RBC 4.73 11/07/2017 0901   RBC 4.70 03/22/2011 1014   HGB 14.8 11/07/2017 0901   HCT 43.6 11/07/2017 0901   PLT 277 03/22/2011 1014   MCV 92 11/07/2017 0901   MCV 93 03/22/2011 1014   MCH 31.3 11/07/2017 0901   MCH 30.5 03/22/2011 1014   MCHC 33.9 11/07/2017 0901   MCHC 32.8 03/22/2011 1014   RDW 11.2 (L) 11/07/2017 0901   RDW 11.3 (L) 03/22/2011 1014   LYMPHSABS 2.4 11/07/2017 0901   LYMPHSABS 2.0 03/22/2011 1014   MONOABS 0.3 03/22/2011 1014   EOSABS 0.1 11/07/2017 0901   EOSABS 0.1 03/22/2011 1014   BASOSABS 0.0 11/07/2017 0901   BASOSABS 0.0 03/22/2011 1014   Iron/TIBC/Ferritin/ %Sat    Component Value Date/Time   FERRITIN 32 03/22/2011 1014   Lipid Panel     Component Value Date/Time   CHOL 208 (H) 11/07/2017 0901   TRIG 71 11/07/2017 0901   HDL 63 11/07/2017 0901   LDLCALC 131 (H) 11/07/2017 0901   Hepatic Function Panel     Component Value Date/Time   PROT 7.1 11/07/2017 0901   PROT 7.4 03/22/2011 1014   ALBUMIN 4.2 11/07/2017 0901   ALBUMIN 3.6 03/22/2011 1014   AST 13 11/07/2017 0901   AST 21 03/22/2011 1014   ALT 12 11/07/2017 0901   ALT 15 03/22/2011 1014   ALKPHOS 61 11/07/2017 0901   ALKPHOS 54 03/22/2011 1014   BILITOT 0.3 11/07/2017 0901   BILITOT 0.5 03/22/2011 1014       Component Value Date/Time   TSH 0.984 11/07/2017 0901   TSH 1.66 03/22/2011 1014   Results for ILYANA, ELWARD (MRN [id number]) as of 05/27/2018 13:28  Ref. Range 11/07/2017 09:01  Vitamin D, 25-Hydroxy Latest Ref Range: 30.0 - 100.0 ng/mL 19.1 (L)     I, Kirke Corin, CMA, am acting as transcriptionist for Wilder Glade, MD I have reviewed the above documentation for accuracy and completeness, and I agree with the above. -Quillian Quince, MD

## 2018-05-27 NOTE — Telephone Encounter (Signed)
  Office: [phone]  /  Fax: [phone]  Date of Call: May 27, 2018 Time of Call: 9:15am Duration of Call: 2 minutes Provider: Lawerance Cruel, PsyD  CONTENT: This provider called Laura Brown again, as she did not present for her WebEx appointment scheduled on May 21, 2018. She shared she was "good" and explained she missed the appointment due to work. Laura Brown expressed desire to reschedule the appointment, but indicated her work schedule will be changing next week. As such, she stated she would call back to reschedule. Notably, Laura Brown was receptive to this provider's office calling back in 2 weeks to check-in if she did not call back during that time to reschedule the appointment. A brief risk assessment was completed. Laura Brown denied experiencing suicidal and homicidal ideation, plan, and intent since the last call with this provider.   PLAN: This provider's office will call Laura Brown in two weeks if needed.

## 2018-05-30 MED FILL — VIT D2 1.25 MG (50,000 UNIT: 1.25 MG | 28 days supply | Qty: 4 | Fill #0

## 2018-05-30 MED FILL — BUPROPION HCL SR 200 MG TAB: 200 | 30 days supply | Qty: 60 | Fill #0

## 2018-06-10 ENCOUNTER — Telehealth (INDEPENDENT_AMBULATORY_CARE_PROVIDER_SITE_OTHER): Payer: Self-pay | Admitting: Psychology

## 2018-06-10 ENCOUNTER — Ambulatory Visit (INDEPENDENT_AMBULATORY_CARE_PROVIDER_SITE_OTHER): Payer: 59 | Admitting: Family Medicine

## 2018-06-10 ENCOUNTER — Other Ambulatory Visit: Payer: Self-pay

## 2018-06-10 ENCOUNTER — Encounter (INDEPENDENT_AMBULATORY_CARE_PROVIDER_SITE_OTHER): Payer: Self-pay | Admitting: Family Medicine

## 2018-06-10 DIAGNOSIS — Z6841 Body Mass Index (BMI) 40.0 and over, adult: Secondary | ICD-10-CM

## 2018-06-10 DIAGNOSIS — F3289 Other specified depressive episodes: Secondary | ICD-10-CM

## 2018-06-10 NOTE — Telephone Encounter (Signed)
  Office: [phone]  /  Fax: [phone]  Date of Call: Jun 10, 2018 Time of Call: 12:42pm Duration of Call: 1 minute Provider: Lawerance Cruel, PsyD  CONTENT:  This provider called Laura Brown to check-in and schedule a follow-up appointment. Laura Brown reported she is waiting for information regarding her job and will call the office to schedule an appointment once she has answers regarding her work schedule. A brief risk assessment was completed. Laura Brown denied experiencing suicidal and homicidal ideation, plan, and intent since the last appointment with this provider.   PLAN: This provider will wait for Laura Brown to call the office to schedule an appointment.

## 2018-06-11 NOTE — Progress Notes (Signed)
Office: [phone]  /  Fax: [phone] TeleHealth Visit:  SOLA MARGOLIS has verbally consented to this TeleHealth visit today. The patient is located at home, the provider is located at the [facility] office. The participants in this visit include the listed provider and patient. Damara was unable to use [email or web address] today and the telehealth visit was conducted via telephone.  HPI:   Chief Complaint: OBESITY Laura Brown is here to discuss her progress with her obesity treatment plan. She is keeping a food journal with 1200 calories and 75+ grams of and is following her eating plan approximately 90 % of the time. She states she is walking 20 minutes 3 times per week. Roxane feels that she has lost another 2 to 3 pounds since our last visit. She is tolerating Saxenda well and her hunger is controlled.  We were unable to weigh the patient today for this TeleHealth visit. She feels as if she has lost weight since her last visit. She has lost 16 lbs since starting treatment with [place].  Depression with emotional eating behaviors Venessa's mood is stable on Wellbutrin and Cymbalta. She is doing better controlling her emotional eating and using food for comfort to the extent that it is negatively impacting her health. Lilla feels more in control. She has been working on behavior modification techniques to help reduce her emotional eating and has been somewhat successful. She denies headache, insomnia, or GI upset.  Depression screen [facility] 2/9 02/24/2018 02/03/2018 12/23/2017 12/05/2017 11/20/2017  Decreased Interest 0 0 0 0 1  Down, Depressed, Hopeless 0 0 0 0 1  PHQ - 2 Score 0 0 0 0 2  Altered sleeping 0 1 0 0 0  Tired, decreased energy 0 0 0 1 2  Change in appetite 0 0 0 0 1  Feeling bad or failure about yourself  0 1 0 0 1  Trouble concentrating 0 0 0 0 0  Moving slowly or fidgety/restless 0 0 0 0 0  Suicidal thoughts 0 0 0 0 0  PHQ-9 Score 0 2 0 1 6  Difficult doing work/chores - - - -  -   ASSESSMENT AND PLAN:  Other depression  Class 3 severe obesity with serious comorbidity and body mass index (BMI) of 45.0 to 49.9 in adult, unspecified obesity type (HCC)  PLAN:  Depression with Emotional Eating Behaviors We discussed behavior modification techniques today to help Kem deal with her emotional eating and depression. She has agreed to continue her medications as prescribed. We will continue to monitor and she agreed to follow up as directed in 2 weeks.  I spent > than 50% of the 15 minute visit on counseling as documented in the note.  Obesity Saory is currently in the action stage of change. As such, her goal is to continue with weight loss efforts. She has agreed to keep a food journal with 1200 calories and 75+ grams of protein.  Mariyanna has been instructed to work up to a goal of 150 minutes of combined cardio and strengthening exercise per week for weight loss and overall health benefits. We discussed the following Behavioral Modification Strategies today: emotional eating strategies and ways to avoid boredom eating. We discussed various medication options to help Veretta with her weight loss efforts and we both agreed to continue taking Saxenda 3 mg [place] qd # 5 pens and pen needles #100 with no refills.  Joane has agreed to follow up with our clinic in 2 weeks. She was  informed of the importance of frequent follow up visits to maximize her success with intensive lifestyle modifications for her multiple health conditions.  ALLERGIES: No Known Allergies  MEDICATIONS: Current Outpatient Medications on File Prior to Visit  Medication Sig Dispense Refill   buPROPion (WELLBUTRIN SR) 200 MG 12 hr tablet Take 1 tablet (200 mg total) by mouth 2 (two) times daily. 60 tablet 0   DULoxetine (CYMBALTA) 60 MG capsule Take 60 mg by mouth 2 (two) times daily.     fexofenadine (ALLEGRA) 180 MG tablet Take 180 mg by mouth daily.     Insulin Pen Needle (BD PEN NEEDLE NANO  2ND GEN) 32G X 4 MM MISC 1 Device by Does not apply route daily. 100 each 0   Liraglutide -Weight Management (SAXENDA) 18 MG/3ML SOPN Inject 3 mg into the skin daily. 5 pen 0   verapamil (VERELAN) 100 MG 24 hr capsule Take 200 mg by mouth at bedtime.     Vitamin D, Ergocalciferol, (DRISDOL) 1.25 MG (50000 UT) CAPS capsule Take 1 capsule (50,000 Units total) by mouth every 7 (seven) days. 4 capsule 0   No current facility-administered medications on file prior to visit.     PAST MEDICAL HISTORY: Past Medical History:  Diagnosis Date   Anxiety    Cluster headaches    Depression    Migraines    Osteoarthritis of both knees     PAST SURGICAL HISTORY: Past Surgical History:  Procedure Laterality Date   CESAREAN SECTION      SOCIAL HISTORY: Social History   Tobacco Use   Smoking status: Never Smoker   Smokeless tobacco: Never Used  Substance Use Topics   Alcohol use: Not on file   Drug use: Not on file    FAMILY HISTORY: Family History  Problem Relation Age of Onset   Hyperlipidemia Mother    Hyperlipidemia Father    Hypertension Father     ROS: Review of Systems  Neurological: Negative for headaches.  Psychiatric/Behavioral: Positive for depression. The patient does not have insomnia.     PHYSICAL EXAM: Pt in no acute distress  RECENT LABS AND TESTS: BMET    Component Value Date/Time   NA 140 11/07/2017 0901   NA 143 03/22/2011 1014   K 4.5 11/07/2017 0901   K 3.9 03/22/2011 1014   CL 100 11/07/2017 0901   CL 107 03/22/2011 1014   CO2 26 11/07/2017 0901   CO2 26 03/22/2011 1014   GLUCOSE 68 11/07/2017 0901   GLUCOSE 77 03/22/2011 1014   BUN 10 11/07/2017 0901   BUN 14 03/22/2011 1014   CREATININE 0.66 11/07/2017 0901   CREATININE 0.73 03/22/2011 1014   CALCIUM 9.1 11/07/2017 0901   CALCIUM 8.7 03/22/2011 1014   GFRNONAA 104 11/07/2017 0901   GFRNONAA >60 03/22/2011 1014   GFRAA 120 11/07/2017 0901   GFRAA >60 03/22/2011 1014    Lab Results  Component Value Date   HGBA1C 4.9 11/07/2017   Lab Results  Component Value Date   INSULIN 6.7 11/07/2017   CBC    Component Value Date/Time   WBC 6.8 11/07/2017 0901   WBC 6.3 03/22/2011 1014   RBC 4.73 11/07/2017 0901   RBC 4.70 03/22/2011 1014   HGB 14.8 11/07/2017 0901   HCT 43.6 11/07/2017 0901   PLT 277 03/22/2011 1014   MCV 92 11/07/2017 0901   MCV 93 03/22/2011 1014   MCH 31.3 11/07/2017 0901   MCH 30.5 03/22/2011 1014   MCHC 33.9  11/07/2017 0901   MCHC 32.8 03/22/2011 1014   RDW 11.2 (L) 11/07/2017 0901   RDW 11.3 (L) 03/22/2011 1014   LYMPHSABS 2.4 11/07/2017 0901   LYMPHSABS 2.0 03/22/2011 1014   MONOABS 0.3 03/22/2011 1014   EOSABS 0.1 11/07/2017 0901   EOSABS 0.1 03/22/2011 1014   BASOSABS 0.0 11/07/2017 0901   BASOSABS 0.0 03/22/2011 1014   Iron/TIBC/Ferritin/ %Sat    Component Value Date/Time   FERRITIN 32 03/22/2011 1014   Lipid Panel     Component Value Date/Time   CHOL 208 (H) 11/07/2017 0901   TRIG 71 11/07/2017 0901   HDL 63 11/07/2017 0901   LDLCALC 131 (H) 11/07/2017 0901   Hepatic Function Panel     Component Value Date/Time   PROT 7.1 11/07/2017 0901   PROT 7.4 03/22/2011 1014   ALBUMIN 4.2 11/07/2017 0901   ALBUMIN 3.6 03/22/2011 1014   AST 13 11/07/2017 0901   AST 21 03/22/2011 1014   ALT 12 11/07/2017 0901   ALT 15 03/22/2011 1014   ALKPHOS 61 11/07/2017 0901   ALKPHOS 54 03/22/2011 1014   BILITOT 0.3 11/07/2017 0901   BILITOT 0.5 03/22/2011 1014      Component Value Date/Time   TSH 0.984 11/07/2017 0901   TSH 1.66 03/22/2011 1014    Results for Lilli FewSENTER, Anamarie H (MRN [id number]) as of 06/11/2018 09:34  Ref. Range 11/07/2017 09:01  Vitamin D, 25-Hydroxy Latest Ref Range: 30.0 - 100.0 ng/mL 19.1 (L)    I, Kirke Corinara Soares, CMA, am acting as transcriptionist for Wilder Gladearen D. Hazim Treadway, MD I have reviewed the above documentation for accuracy and completeness, and I agree with the above. -Quillian Quincearen Venecia Mehl, MD

## 2018-06-12 MED ORDER — INSULIN PEN NEEDLE 32G X 4 MM MISC: 1.0000 | Freq: Every day | 0 refills | Status: DC

## 2018-06-12 MED ORDER — LIRAGLUTIDE -WEIGHT MANAGEMENT 18 MG/3ML ~~LOC~~ SOPN: 3.0000 mg | PEN_INJECTOR | Freq: Every day | SUBCUTANEOUS | 0 refills | Status: DC

## 2018-06-14 MED FILL — UNIFINE PENTIPS 32GX5/32: 32G X 4 MM | 90 days supply | Qty: 100 | Fill #0

## 2018-06-14 MED FILL — UNIFINE PENTIPS 32GX5/32": 32G X 4 MM | 90 days supply | Qty: 100 | Fill #0

## 2018-06-14 MED FILL — SAXENDA 18 MG/3 ML PEN: 18 | 30 days supply | Qty: 15 | Fill #0

## 2018-06-16 DIAGNOSIS — F411 Generalized anxiety disorder: Secondary | ICD-10-CM | POA: Diagnosis not present

## 2018-06-16 DIAGNOSIS — M170 Bilateral primary osteoarthritis of knee: Secondary | ICD-10-CM | POA: Diagnosis not present

## 2018-06-16 DIAGNOSIS — F331 Major depressive disorder, recurrent, moderate: Secondary | ICD-10-CM | POA: Diagnosis not present

## 2018-06-18 DIAGNOSIS — M170 Bilateral primary osteoarthritis of knee: Secondary | ICD-10-CM | POA: Diagnosis not present

## 2018-06-18 MED FILL — DULOXETINE HCL 60 MG CPEP: 60 | 90 days supply | Qty: 180 | Fill #0

## 2018-06-25 ENCOUNTER — Other Ambulatory Visit: Payer: Self-pay

## 2018-06-25 ENCOUNTER — Encounter (INDEPENDENT_AMBULATORY_CARE_PROVIDER_SITE_OTHER): Payer: Self-pay | Admitting: Family Medicine

## 2018-06-25 ENCOUNTER — Ambulatory Visit (INDEPENDENT_AMBULATORY_CARE_PROVIDER_SITE_OTHER): Payer: 59 | Admitting: Family Medicine

## 2018-06-25 DIAGNOSIS — E559 Vitamin D deficiency, unspecified: Secondary | ICD-10-CM

## 2018-06-25 DIAGNOSIS — Z6841 Body Mass Index (BMI) 40.0 and over, adult: Secondary | ICD-10-CM

## 2018-06-25 DIAGNOSIS — M170 Bilateral primary osteoarthritis of knee: Secondary | ICD-10-CM | POA: Diagnosis not present

## 2018-06-25 MED ORDER — VITAMIN D (ERGOCALCIFEROL) 1.25 MG (50000 UNIT) PO CAPS: 50000.0000 [IU] | ORAL_CAPSULE | ORAL | 0 refills | Status: DC

## 2018-06-25 MED FILL — VIT D2 1.25 MG (50,000 UNIT: 1.25 MG | 28 days supply | Qty: 4 | Fill #0

## 2018-06-25 NOTE — Progress Notes (Signed)
Office: [phone]  /  Fax: [phone] TeleHealth Visit:  Laura Brown has verbally consented to this TeleHealth visit today. The patient is located at home, the provider is located at the [facility] office. The participants in this visit include the listed provider and patient and any and all parties involved. The visit was conducted today via telephone. Laura Brown was unable to use realtime audiovisual technology today and the telehealth visit was conducted via telephone.  HPI:   Chief Complaint: OBESITY Laura Brown is here to discuss her progress with her obesity treatment plan. She is on the keep a food journal with 1200 calories and 80 grams of protein daily plan and is following her eating plan approximately 80 % of the time. She states she is exercising 0 minutes 0 times per week. Laura Brown feels she has done well maintaining weight and that maybe she has lost another 1 to 2 pounds. Laura Brown indulged some and she now feels she has more sugar cravings. Laura Brown had her knees injected and she is ready to do more walking. We were unable to weigh the patient today for this TeleHealth visit. She feels as if she has lost weight since her last visit.   Vitamin D deficiency Laura Brown has a diagnosis of vitamin D deficiency. She is not yet at goal. Laura Brown is stable on vit D and she denies nausea, vomiting or muscle weakness.  ASSESSMENT AND PLAN:  Vitamin D deficiency - Plan: Vitamin D, Ergocalciferol, (DRISDOL) 1.25 MG (50000 UT) CAPS capsule  Class 3 severe obesity with serious comorbidity and body mass index (BMI) of 45.0 to 49.9 in adult, unspecified obesity type (HCC)  PLAN:  Vitamin D Deficiency Laura Brown was informed that low vitamin D levels contributes to fatigue and are associated with obesity, breast, and colon cancer. She agrees to continue to take prescription Vit D @50 ,000 IU every week #4 with no refills and will follow up for routine testing of vitamin D, at least 2-3 times  per year. She was informed of the risk of over-replacement of vitamin D and agrees to not increase her dose unless she discusses this with us first. Laura Brown agrees to follow up with our clinic in 2 weeks.  I spent > than 50% of the 15 minute visit on counseling as documented in the note.  Obesity Laura Brown is currently in the action stage of change. As such, her goal is to continue with weight loss efforts She has agreed to keep a food journal with 1200 calories and 75+ grams of protein daily Laura Brown will start walking 10 to 15 minutes per day for weight loss and overall health benefits. We discussed the following Behavioral Modification Strategies today: planning for success, better snacking choices, work on meal planning and easy cooking plans and emotional eating strategies  Laura Brown has agreed to follow up with our clinic in 2 weeks. She was informed of the importance of frequent follow up visits to maximize her success with intensive lifestyle modifications for her multiple health conditions.  ALLERGIES: No Known Allergies  MEDICATIONS: Current Outpatient Medications on File Prior to Visit  Medication Sig Dispense Refill  . buPROPion (WELLBUTRIN SR) 200 MG 12 hr tablet Take 1 tablet (200 mg total) by mouth 2 (two) times daily. 60 tablet 0  . DULoxetine (CYMBALTA) 60 MG capsule Take 60 mg by mouth 2 (two) times daily.    . fexofenadine (ALLEGRA) 180 MG tablet Take 180 mg by mouth daily.    . Insulin Pen Needle (  BD PEN NEEDLE NANO 2ND GEN) 32G X 4 MM MISC 1 Device by Does not apply route daily. 100 each 0  . Liraglutide -Weight Management (SAXENDA) 18 MG/3ML SOPN Inject 3 mg into the skin daily. 5 pen 0  . verapamil (VERELAN) 100 MG 24 hr capsule Take 200 mg by mouth at bedtime.     No current facility-administered medications on file prior to visit.     PAST MEDICAL HISTORY: Past Medical History:  Diagnosis Date  . Anxiety   . Cluster headaches   . Depression   . Migraines   .  Osteoarthritis of both knees     PAST SURGICAL HISTORY: Past Surgical History:  Procedure Laterality Date  . CESAREAN SECTION      SOCIAL HISTORY: Social History   Tobacco Use  . Smoking status: Never Smoker  . Smokeless tobacco: Never Used  Substance Use Topics  . Alcohol use: Not on file  . Drug use: Not on file    FAMILY HISTORY: Family History  Problem Relation Age of Onset  . Hyperlipidemia Mother   . Hyperlipidemia Father   . Hypertension Father     ROS: Review of Systems  Constitutional: Positive for weight loss.  Gastrointestinal: Negative for nausea and vomiting.  Musculoskeletal:       Negative for muscle weakness    PHYSICAL EXAM: Pt in no acute distress  RECENT LABS AND TESTS: BMET    Component Value Date/Time   NA 140 11/07/2017 0901   NA 143 03/22/2011 1014   K 4.5 11/07/2017 0901   K 3.9 03/22/2011 1014   CL 100 11/07/2017 0901   CL 107 03/22/2011 1014   CO2 26 11/07/2017 0901   CO2 26 03/22/2011 1014   GLUCOSE 68 11/07/2017 0901   GLUCOSE 77 03/22/2011 1014   BUN 10 11/07/2017 0901   BUN 14 03/22/2011 1014   CREATININE 0.66 11/07/2017 0901   CREATININE 0.73 03/22/2011 1014   CALCIUM 9.1 11/07/2017 0901   CALCIUM 8.7 03/22/2011 1014   GFRNONAA 104 11/07/2017 0901   GFRNONAA >60 03/22/2011 1014   GFRAA 120 11/07/2017 0901   GFRAA >60 03/22/2011 1014   Lab Results  Component Value Date   HGBA1C 4.9 11/07/2017   Lab Results  Component Value Date   INSULIN 6.7 11/07/2017   CBC    Component Value Date/Time   WBC 6.8 11/07/2017 0901   WBC 6.3 03/22/2011 1014   RBC 4.73 11/07/2017 0901   RBC 4.70 03/22/2011 1014   HGB 14.8 11/07/2017 0901   HCT 43.6 11/07/2017 0901   PLT 277 03/22/2011 1014   MCV 92 11/07/2017 0901   MCV 93 03/22/2011 1014   MCH 31.3 11/07/2017 0901   MCH 30.5 03/22/2011 1014   MCHC 33.9 11/07/2017 0901   MCHC 32.8 03/22/2011 1014   RDW 11.2 (L) 11/07/2017 0901   RDW 11.3 (L) 03/22/2011 1014   LYMPHSABS  2.4 11/07/2017 0901   LYMPHSABS 2.0 03/22/2011 1014   MONOABS 0.3 03/22/2011 1014   EOSABS 0.1 11/07/2017 0901   EOSABS 0.1 03/22/2011 1014   BASOSABS 0.0 11/07/2017 0901   BASOSABS 0.0 03/22/2011 1014   Iron/TIBC/Ferritin/ %Sat    Component Value Date/Time   FERRITIN 32 03/22/2011 1014   Lipid Panel     Component Value Date/Time   CHOL 208 (H) 11/07/2017 0901   TRIG 71 11/07/2017 0901   HDL 63 11/07/2017 0901   LDLCALC 131 (H) 11/07/2017 0901   Hepatic Function Panel  Component Value Date/Time   PROT 7.1 11/07/2017 0901   PROT 7.4 03/22/2011 1014   ALBUMIN 4.2 11/07/2017 0901   ALBUMIN 3.6 03/22/2011 1014   AST 13 11/07/2017 0901   AST 21 03/22/2011 1014   ALT 12 11/07/2017 0901   ALT 15 03/22/2011 1014   ALKPHOS 61 11/07/2017 0901   ALKPHOS 54 03/22/2011 1014   BILITOT 0.3 11/07/2017 0901   BILITOT 0.5 03/22/2011 1014      Component Value Date/Time   TSH 0.984 11/07/2017 0901   TSH 1.66 03/22/2011 1014    Results for ALYCEN, GONYA (MRN [id number]) as of 06/25/2018 14:24  Ref. Range 11/07/2017 09:01  Vitamin D, 25-Hydroxy Latest Ref Range: 30.0 - 100.0 ng/mL 19.1 (L)    I, [place] Crane, am acting as Energy manager for Quillian Quince, MD I have reviewed the above documentation for accuracy and completeness, and I agree with the above. -Quillian Quince, MD

## 2018-07-02 DIAGNOSIS — M170 Bilateral primary osteoarthritis of knee: Secondary | ICD-10-CM | POA: Diagnosis not present

## 2018-07-09 ENCOUNTER — Other Ambulatory Visit: Payer: Self-pay

## 2018-07-09 ENCOUNTER — Ambulatory Visit (INDEPENDENT_AMBULATORY_CARE_PROVIDER_SITE_OTHER): Payer: 59 | Admitting: Family Medicine

## 2018-07-09 ENCOUNTER — Encounter (INDEPENDENT_AMBULATORY_CARE_PROVIDER_SITE_OTHER): Payer: Self-pay | Admitting: Family Medicine

## 2018-07-09 DIAGNOSIS — Z6841 Body Mass Index (BMI) 40.0 and over, adult: Secondary | ICD-10-CM

## 2018-07-09 DIAGNOSIS — F3289 Other specified depressive episodes: Secondary | ICD-10-CM | POA: Diagnosis not present

## 2018-07-09 DIAGNOSIS — M170 Bilateral primary osteoarthritis of knee: Secondary | ICD-10-CM | POA: Diagnosis not present

## 2018-07-09 MED ORDER — BUPROPION HCL ER (SR) 200 MG PO TB12: 200.0000 mg | ORAL_TABLET | Freq: Two times a day (BID) | ORAL | 0 refills | Status: DC

## 2018-07-09 MED FILL — BUPROPION HCL SR 200 MG TAB: 200 | 30 days supply | Qty: 60 | Fill #0

## 2018-07-14 NOTE — Progress Notes (Signed)
Office: [phone]  /  Fax: [phone] TeleHealth Visit:  Laura Brown has verbally consented to this TeleHealth visit today. The patient is located at home, the provider is located at the [facility] office. The participants in this visit include the listed provider and patient. Laura Brown was unable to use Face Time today and the telehealth visit was conducted via telephone.  HPI:   Chief Complaint: OBESITY Laura Brown is here to discuss her progress with her obesity treatment plan. She is keeping a food journal with 1200 calories and 75+ grams of protein and is following her eating plan approximately 80 % of the time. She states she is walking 20 minutes 2 times per week. Laura Brown feels that she has done well maintaining her weight. She notes decreased hunger on Saxenda, when she missed 5 days of medicine, her hunger definitely increased. She has been walking 2 to 3 times per week and she feels that she is doing well meeting her protein goals.  We were unable to weigh the patient today for this TeleHealth visit. She feels as if she has maintained weight since her last visit. She has lost 16 lbs since starting treatment with us.  Depression with emotional eating behaviors Laura Brown feels that stable on Wellbutrin and notes that her mood is better with reduced irritability and decreased emotional stress eating and using food for comfort to the extent that it is negatively impacting her health. She has been working on behavior modification techniques to help reduce her emotional eating and has been somewhat successful. She denies insomnia or palpitations.  [facility] 02/24/2018 02/03/2018 12/23/2017 12/05/2017 11/20/2017  Decreased Interest 0 0 0 0 1  Down, Depressed, Hopeless 0 0 0 0 1  PHQ - 2 Score 0 0 0 0 2  Altered sleeping 0 1 0 0 0  Tired, decreased energy 0 0 0 1 2  Change in appetite 0 0 0 0 1  Feeling bad or failure about yourself  0 1 0 0 1  Trouble  concentrating 0 0 0 0 0  Moving slowly or fidgety/restless 0 0 0 0 0  Suicidal thoughts 0 0 0 0 0  PHQ-9 Score 0 2 0 1 6  Difficult doing work/chores - - - - -   ASSESSMENT AND PLAN:  Other depression - with emotional eating - Plan: buPROPion (WELLBUTRIN SR) 200 MG 12 hr tablet  Class 3 severe obesity with serious comorbidity and body mass index (BMI) of 45.0 to 49.9 in adult, unspecified obesity type (HCC)  PLAN:  Depression with Emotional Eating Behaviors We discussed behavior modification techniques today to help Laura Brown deal with her emotional eating and depression. She has agreed to take Wellbutrin SR 200 mg BID #60 with no refills and agreed to follow up as directed in 2 to 3 weeks.  Obesity Laura Brown is currently in the action stage of change. As such, her goal is to continue with weight loss efforts. She has agreed to keep a food journal with 1200 calories and 75+ grams of protein.  Laura Brown has been instructed to work up to a goal of 150 minutes of combined cardio and strengthening exercise per week for weight loss and overall health benefits. We discussed the following Behavioral Modification Strategies today: increasing lean protein intake, decreasing simple carbohydrates, work on meal planning and easy cooking plans, celebration eating strategies, and travel eating strategies.  Laura Brown has agreed to follow up with our clinic in 2 to 3 weeks. She  was informed of the importance of frequent follow up visits to maximize her success with intensive lifestyle modifications for her multiple health conditions.  ALLERGIES: No Known Allergies  MEDICATIONS: Current Outpatient Medications on File Prior to Visit  Medication Sig Dispense Refill   DULoxetine (CYMBALTA) 60 MG capsule Take 60 mg by mouth 2 (two) times daily.     fexofenadine (ALLEGRA) 180 MG tablet Take 180 mg by mouth daily.     Insulin Pen Needle (BD PEN NEEDLE NANO 2ND GEN) 32G X 4 MM MISC 1 Device by Does not apply route  daily. 100 each 0   Liraglutide -Weight Management (SAXENDA) 18 MG/3ML SOPN Inject 3 mg into the skin daily. 5 pen 0   verapamil (VERELAN) 100 MG 24 hr capsule Take 200 mg by mouth at bedtime.     Vitamin D, Ergocalciferol, (DRISDOL) 1.25 MG (50000 UT) CAPS capsule Take 1 capsule (50,000 Units total) by mouth every 7 (seven) days. 4 capsule 0   No current facility-administered medications on file prior to visit.     PAST MEDICAL HISTORY: Past Medical History:  Diagnosis Date   Anxiety    Cluster headaches    Depression    Migraines    Osteoarthritis of both knees     PAST SURGICAL HISTORY: Past Surgical History:  Procedure Laterality Date   CESAREAN SECTION      SOCIAL HISTORY: Social History   Tobacco Use   Smoking status: Never Smoker   Smokeless tobacco: Never Used  Substance Use Topics   Alcohol use: Not on file   Drug use: Not on file    FAMILY HISTORY: Family History  Problem Relation Age of Onset   Hyperlipidemia Mother    Hyperlipidemia Father    Hypertension Father     ROS: Review of Systems  Cardiovascular: Negative for palpitations.  Psychiatric/Behavioral: Positive for depression. The patient does not have insomnia.     PHYSICAL EXAM: Pt in no acute distress  RECENT LABS AND TESTS: BMET    Component Value Date/Time   NA 140 11/07/2017 0901   NA 143 03/22/2011 1014   K 4.5 11/07/2017 0901   K 3.9 03/22/2011 1014   CL 100 11/07/2017 0901   CL 107 03/22/2011 1014   CO2 26 11/07/2017 0901   CO2 26 03/22/2011 1014   GLUCOSE 68 11/07/2017 0901   GLUCOSE 77 03/22/2011 1014   BUN 10 11/07/2017 0901   BUN 14 03/22/2011 1014   CREATININE 0.66 11/07/2017 0901   CREATININE 0.73 03/22/2011 1014   CALCIUM 9.1 11/07/2017 0901   CALCIUM 8.7 03/22/2011 1014   GFRNONAA 104 11/07/2017 0901   GFRNONAA >60 03/22/2011 1014   GFRAA 120 11/07/2017 0901   GFRAA >60 03/22/2011 1014   Lab Results  Component Value Date   HGBA1C 4.9  11/07/2017   Lab Results  Component Value Date   INSULIN 6.7 11/07/2017   CBC    Component Value Date/Time   WBC 6.8 11/07/2017 0901   WBC 6.3 03/22/2011 1014   RBC 4.73 11/07/2017 0901   RBC 4.70 03/22/2011 1014   HGB 14.8 11/07/2017 0901   HCT 43.6 11/07/2017 0901   PLT 277 03/22/2011 1014   MCV 92 11/07/2017 0901   MCV 93 03/22/2011 1014   MCH 31.3 11/07/2017 0901   MCH 30.5 03/22/2011 1014   MCHC 33.9 11/07/2017 0901   MCHC 32.8 03/22/2011 1014   RDW 11.2 (L) 11/07/2017 0901   RDW 11.3 (L) 03/22/2011 1014  LYMPHSABS 2.4 11/07/2017 0901   LYMPHSABS 2.0 03/22/2011 1014   MONOABS 0.3 03/22/2011 1014   EOSABS 0.1 11/07/2017 0901   EOSABS 0.1 03/22/2011 1014   BASOSABS 0.0 11/07/2017 0901   BASOSABS 0.0 03/22/2011 1014   Iron/TIBC/Ferritin/ %Sat    Component Value Date/Time   FERRITIN 32 03/22/2011 1014   Lipid Panel     Component Value Date/Time   CHOL 208 (H) 11/07/2017 0901   TRIG 71 11/07/2017 0901   HDL 63 11/07/2017 0901   LDLCALC 131 (H) 11/07/2017 0901   Hepatic Function Panel     Component Value Date/Time   PROT 7.1 11/07/2017 0901   PROT 7.4 03/22/2011 1014   ALBUMIN 4.2 11/07/2017 0901   ALBUMIN 3.6 03/22/2011 1014   AST 13 11/07/2017 0901   AST 21 03/22/2011 1014   ALT 12 11/07/2017 0901   ALT 15 03/22/2011 1014   ALKPHOS 61 11/07/2017 0901   ALKPHOS 54 03/22/2011 1014   BILITOT 0.3 11/07/2017 0901   BILITOT 0.5 03/22/2011 1014      Component Value Date/Time   TSH 0.984 11/07/2017 0901   TSH 1.66 03/22/2011 1014   Results for Laura FewSENTER, Shanecia H (MRN [id number]) as of 07/14/2018 12:13  Ref. Range 11/07/2017 09:01  Vitamin D, 25-Hydroxy Latest Ref Range: 30.0 - 100.0 ng/mL 19.1 (L)     I, Kirke Corinara Soares, CMA, am acting as transcriptionist for Wilder Gladearen D. Roshawn Lacina, MD I have reviewed the above documentation for accuracy and completeness, and I agree with the above. -Quillian Quincearen Diamon Reddinger, MD

## 2018-07-15 DIAGNOSIS — F329 Major depressive disorder, single episode, unspecified: Secondary | ICD-10-CM | POA: Insufficient documentation

## 2018-07-15 DIAGNOSIS — F32A Depression, unspecified: Secondary | ICD-10-CM | POA: Insufficient documentation

## 2018-07-15 DIAGNOSIS — Z6841 Body Mass Index (BMI) 40.0 and over, adult: Secondary | ICD-10-CM | POA: Insufficient documentation

## 2018-07-23 ENCOUNTER — Telehealth (INDEPENDENT_AMBULATORY_CARE_PROVIDER_SITE_OTHER): Payer: 59 | Admitting: Family Medicine

## 2018-07-23 ENCOUNTER — Encounter (INDEPENDENT_AMBULATORY_CARE_PROVIDER_SITE_OTHER): Payer: Self-pay | Admitting: Family Medicine

## 2018-07-23 ENCOUNTER — Other Ambulatory Visit: Payer: Self-pay

## 2018-07-23 DIAGNOSIS — Z6841 Body Mass Index (BMI) 40.0 and over, adult: Secondary | ICD-10-CM | POA: Diagnosis not present

## 2018-07-23 DIAGNOSIS — E559 Vitamin D deficiency, unspecified: Secondary | ICD-10-CM

## 2018-07-23 DIAGNOSIS — I10 Essential (primary) hypertension: Secondary | ICD-10-CM

## 2018-07-23 MED ORDER — VITAMIN D (ERGOCALCIFEROL) 1.25 MG (50000 UNIT) PO CAPS: 50000.0000 [IU] | ORAL_CAPSULE | ORAL | 0 refills | Status: DC

## 2018-07-23 MED FILL — VIT D2 1.25 MG (50,000 UNIT: 1.25 MG | 28 days supply | Qty: 4 | Fill #0

## 2018-07-24 NOTE — Progress Notes (Signed)
Office: [phone]  /  Fax: [phone] TeleHealth Visit:  Laura Brown has verbally consented to this TeleHealth visit today. The patient is located at home, the provider is located at the [facility] office. The participants in this visit include the listed provider and patient. Laura Brown was unable to use Face Time and the telehealth visit was conducted via telephone.  HPI:   Chief Complaint: OBESITY Laura Brown is here to discuss her progress with her obesity treatment plan. She is keeping a food journal with 1200 calories and 80+ grams of protein and is following her eating plan approximately 50 % of the time. She states she is walking 20 minutes 2 times per week. Laura Brown is journaling on and off and maintaining her weight well. She feels that hunger is controlled on Saxenda 1.2 mg.   We were unable to weigh the patient today for this TeleHealth visit. She feels as if she has maintained weight since her last visit. She has lost 16 lbs since starting treatment with [place].  Vitamin D Deficiency Laura Brown has a diagnosis of vitamin D deficiency. She is currently stable on vit D. Laura Brown denies nausea, vomiting, or muscle weakness.  Hypertension Laura Brown is a 48 y.o. female with hypertension. Laura Brown's blood pressure has been stable on verapamil. She is working on diet, weight loss, and exercise to help control her blood pressure with the goal of decreasing her risk of heart attack and stroke.   ASSESSMENT AND PLAN:  Vitamin D deficiency - Plan: Vitamin D, Ergocalciferol, (DRISDOL) 1.25 MG (50000 UT) CAPS capsule  Essential hypertension  Class 3 severe obesity with serious comorbidity and body mass index (BMI) of 45.0 to 49.9 in adult, unspecified obesity type (Laura Brown)  PLAN:  Vitamin D Deficiency Laura Brown was informed that low vitamin D levels contribute to fatigue and are associated with obesity, breast, and colon cancer. Laura Brown agrees to continue to take prescription Vit D  @50 ,000 IU every week #4 with no refills and will follow up for routine testing of vitamin D, at least 2-3 times per year. She was informed of the risk of over-replacement of vitamin D and agrees to not increase her dose unless she discusses this with [place] first. We will check labs at her next visit and monitor her progress. Laura Brown agrees to follow up in 2 weeks as directed.  Hypertension We discussed sodium restriction, working on healthy weight loss, and a regular exercise program as the means to achieve improved blood pressure control. We will continue to monitor her blood pressure as well as her progress with the above lifestyle modifications. She will continue her medications as prescribed and to get back to her diet strictly. She will watch for signs of hypotension as she continues her lifestyle modifications. Laura Brown agreed with this plan and agreed to follow up as directed.  I spent > than 50% of the 25 minute visit on counseling as documented in the note.  Obesity Laura Brown is currently in the action stage of change. As such, her goal is to continue with weight loss efforts. She has agreed to keep a food journal with 1200 calories and 80 grams of protein.  Laura Brown has been instructed to work up to a goal of 150 minutes of combined cardio and strengthening exercise per week for weight loss and overall health benefits. We discussed the following Behavioral Modification Strategies today: work on meal planning and easy cooking plans and keep a strict food journal.  Laura Brown has agreed to  follow up with our clinic in 2 weeks. She was informed of the importance of frequent follow up visits to maximize her success with intensive lifestyle modifications for her multiple health conditions.  ALLERGIES: No Known Allergies  MEDICATIONS: Current Outpatient Medications on File Prior to Visit  Medication Sig Dispense Refill  . buPROPion (WELLBUTRIN SR) 200 MG 12 hr tablet Take 1 tablet (200 mg total) by mouth  2 (two) times daily. 60 tablet 0  . DULoxetine (CYMBALTA) 60 MG capsule Take 60 mg by mouth 2 (two) times daily.    . fexofenadine (ALLEGRA) 180 MG tablet Take 180 mg by mouth daily.    . Insulin Pen Needle (BD PEN NEEDLE NANO 2ND GEN) 32G X 4 MM MISC 1 Device by Does not apply route daily. 100 each 0  . Liraglutide -Weight Management (SAXENDA) 18 MG/3ML SOPN Inject 3 mg into the skin daily. 5 pen 0  . verapamil (VERELAN) 100 MG 24 hr capsule Take 200 mg by mouth at bedtime.     No current facility-administered medications on file prior to visit.     PAST MEDICAL HISTORY: Past Medical History:  Diagnosis Date  . Anxiety   . Cluster headaches   . Depression   . Migraines   . Osteoarthritis of both knees     PAST SURGICAL HISTORY: Past Surgical History:  Procedure Laterality Date  . CESAREAN SECTION      SOCIAL HISTORY: Social History   Tobacco Use  . Smoking status: Never Smoker  . Smokeless tobacco: Never Used  Substance Use Topics  . Alcohol use: Not on file  . Drug use: Not on file    FAMILY HISTORY: Family History  Problem Relation Age of Onset  . Hyperlipidemia Mother   . Hyperlipidemia Father   . Hypertension Father     ROS: Review of Systems  Gastrointestinal: Negative for nausea and vomiting.  Musculoskeletal:       Negative for muscle weakness.    PHYSICAL EXAM: Pt in no acute distress  RECENT LABS AND TESTS: BMET    Component Value Date/Time   NA 140 11/07/2017 0901   NA 143 03/22/2011 1014   K 4.5 11/07/2017 0901   K 3.9 03/22/2011 1014   CL 100 11/07/2017 0901   CL 107 03/22/2011 1014   CO2 26 11/07/2017 0901   CO2 26 03/22/2011 1014   GLUCOSE 68 11/07/2017 0901   GLUCOSE 77 03/22/2011 1014   BUN 10 11/07/2017 0901   BUN 14 03/22/2011 1014   CREATININE 0.66 11/07/2017 0901   CREATININE 0.73 03/22/2011 1014   CALCIUM 9.1 11/07/2017 0901   CALCIUM 8.7 03/22/2011 1014   GFRNONAA 104 11/07/2017 0901   GFRNONAA >60 03/22/2011 1014    GFRAA 120 11/07/2017 0901   GFRAA >60 03/22/2011 1014   Lab Results  Component Value Date   HGBA1C 4.9 11/07/2017   Lab Results  Component Value Date   INSULIN 6.7 11/07/2017   CBC    Component Value Date/Time   WBC 6.8 11/07/2017 0901   WBC 6.3 03/22/2011 1014   RBC 4.73 11/07/2017 0901   RBC 4.70 03/22/2011 1014   HGB 14.8 11/07/2017 0901   HCT 43.6 11/07/2017 0901   PLT 277 03/22/2011 1014   MCV 92 11/07/2017 0901   MCV 93 03/22/2011 1014   MCH 31.3 11/07/2017 0901   MCH 30.5 03/22/2011 1014   MCHC 33.9 11/07/2017 0901   MCHC 32.8 03/22/2011 1014   RDW 11.2 (L) 11/07/2017 0901  RDW 11.3 (L) 03/22/2011 1014   LYMPHSABS 2.4 11/07/2017 0901   LYMPHSABS 2.0 03/22/2011 1014   MONOABS 0.3 03/22/2011 1014   EOSABS 0.1 11/07/2017 0901   EOSABS 0.1 03/22/2011 1014   BASOSABS 0.0 11/07/2017 0901   BASOSABS 0.0 03/22/2011 1014   Iron/TIBC/Ferritin/ %Sat    Component Value Date/Time   FERRITIN 32 03/22/2011 1014   Lipid Panel     Component Value Date/Time   CHOL 208 (H) 11/07/2017 0901   TRIG 71 11/07/2017 0901   HDL 63 11/07/2017 0901   LDLCALC 131 (H) 11/07/2017 0901   Hepatic Function Panel     Component Value Date/Time   PROT 7.1 11/07/2017 0901   PROT 7.4 03/22/2011 1014   ALBUMIN 4.2 11/07/2017 0901   ALBUMIN 3.6 03/22/2011 1014   AST 13 11/07/2017 0901   AST 21 03/22/2011 1014   ALT 12 11/07/2017 0901   ALT 15 03/22/2011 1014   ALKPHOS 61 11/07/2017 0901   ALKPHOS 54 03/22/2011 1014   BILITOT 0.3 11/07/2017 0901   BILITOT 0.5 03/22/2011 1014      Component Value Date/Time   TSH 0.984 11/07/2017 0901   TSH 1.66 03/22/2011 1014    Results for Lilli FewSENTER, Arretta H (MRN [id number]) as of 07/24/2018 08:23  Ref. Range 11/07/2017 09:01  Vitamin D, 25-Hydroxy Latest Ref Range: 30.0 - 100.0 ng/mL 19.1 (L)    I, Kirke Corinara Soares, CMA, am acting as transcriptionist for Wilder Gladearen D. Hallelujah Wysong, MD I have reviewed the above documentation for accuracy and completeness,  and I agree with the above. -Quillian Quincearen Jenita Rayfield, MD

## 2018-07-31 MED FILL — VERAPAMIL ER PM 100 MG CAP: 100 | 30 days supply | Qty: 60 | Fill #0

## 2018-08-10 DIAGNOSIS — I10 Essential (primary) hypertension: Secondary | ICD-10-CM | POA: Diagnosis not present

## 2018-08-10 DIAGNOSIS — G510 Bell's palsy: Secondary | ICD-10-CM | POA: Diagnosis not present

## 2018-08-10 DIAGNOSIS — R51 Headache: Secondary | ICD-10-CM | POA: Diagnosis not present

## 2018-08-10 DIAGNOSIS — Z6841 Body Mass Index (BMI) 40.0 and over, adult: Secondary | ICD-10-CM | POA: Diagnosis not present

## 2018-08-13 ENCOUNTER — Telehealth (INDEPENDENT_AMBULATORY_CARE_PROVIDER_SITE_OTHER): Payer: 59 | Admitting: Family Medicine

## 2018-08-13 ENCOUNTER — Encounter (INDEPENDENT_AMBULATORY_CARE_PROVIDER_SITE_OTHER): Payer: Self-pay | Admitting: Family Medicine

## 2018-08-13 ENCOUNTER — Other Ambulatory Visit: Payer: Self-pay

## 2018-08-13 DIAGNOSIS — E559 Vitamin D deficiency, unspecified: Secondary | ICD-10-CM | POA: Diagnosis not present

## 2018-08-13 DIAGNOSIS — F3289 Other specified depressive episodes: Secondary | ICD-10-CM

## 2018-08-13 DIAGNOSIS — Z6841 Body Mass Index (BMI) 40.0 and over, adult: Secondary | ICD-10-CM | POA: Diagnosis not present

## 2018-08-13 MED ORDER — BUPROPION HCL ER (SR) 200 MG PO TB12: 200.0000 mg | ORAL_TABLET | Freq: Two times a day (BID) | ORAL | 0 refills | Status: DC

## 2018-08-13 MED ORDER — VITAMIN D (ERGOCALCIFEROL) 1.25 MG (50000 UNIT) PO CAPS: 50000.0000 [IU] | ORAL_CAPSULE | ORAL | 0 refills | Status: DC

## 2018-08-13 MED FILL — VIT D2 1.25 MG (50,000 UNIT: 1.25 MG | 28 days supply | Qty: 4 | Fill #0

## 2018-08-13 MED FILL — BUPROPION HCL SR 200 MG TAB: 200 | 30 days supply | Qty: 60 | Fill #0

## 2018-08-14 DIAGNOSIS — Z1159 Encounter for screening for other viral diseases: Secondary | ICD-10-CM | POA: Diagnosis not present

## 2018-08-14 NOTE — Progress Notes (Signed)
Office: [phone]  /  Fax: [phone] TeleHealth Visit:  Laura Brown has verbally consented to this TeleHealth visit today. The patient is located at home, the provider is located at the [facility]. The participants in this visit include the listed provider and patient. The visit was conducted today via telephone call (FaceTime failed and changed to telephone call).  HPI:   Chief Complaint: OBESITY Laura Landngela is here to discuss her progress with her obesity treatment plan. She is keeping a food journal with 1200 calories and is following her eating plan approximately 50% of the time. She states she is walking 20 minutes 2 times per week. Laura Landngela feels she has lost 1-2 lbs since her last visit. She states she is journaling on and off. She woke up with Bell's palsy on her left side and is now on prednisone, which may affect her weight. She is stable on Saxenda 1.2. She does struggle with chewing right now. We were unable to weigh the patient today for this TeleHealth visit. She feels as if she has lost 1-2 lbs since her last visit. She has lost 16 lbs since starting treatment with us.  Vitamin D deficiency Laura Landngela has a diagnosis of Vitamin D deficiency. She is stable on prescription Vit D and denies nausea, vomiting or muscle weakness. Laura Landngela is getting labs done at her PCP's office next week.  Depression with emotional eating behaviors Laura Landngela is struggling with emotional eating and using food for comfort to the extent that it is negatively impacting her health. She often snacks when she is not hungry. Laura Landngela sometimes feels she is out of control and then feels guilty that she made poor food choices. She has been working on behavior modification techniques to help reduce her emotional eating and has been somewhat successful. Laura Landngela is stable on Wellbutrin and Cymbalta. She has had increased stressors involving the death of her aunt and then developing Bell's palsy. She has  not yet increased emotional eating and is strongly mindful. She shows no sign of suicidal or homicidal ideations.  [facility] 02/24/2018 02/03/2018 12/23/2017 12/05/2017 11/20/2017  Decreased Interest 0 0 0 0 1  Down, Depressed, Hopeless 0 0 0 0 1  PHQ - 2 Score 0 0 0 0 2  Altered sleeping 0 1 0 0 0  Tired, decreased energy 0 0 0 1 2  Change in appetite 0 0 0 0 1  Feeling bad or failure about yourself  0 1 0 0 1  Trouble concentrating 0 0 0 0 0  Moving slowly or fidgety/restless 0 0 0 0 0  Suicidal thoughts 0 0 0 0 0  PHQ-9 Score 0 2 0 1 6  Difficult doing work/chores - - - - -   ASSESSMENT AND PLAN:  Vitamin D deficiency - Plan: Vitamin D, Ergocalciferol, (DRISDOL) 1.25 MG (50000 UT) CAPS capsule  Other depression - with emotional eating - Plan: buPROPion (WELLBUTRIN SR) 200 MG 12 hr tablet  Class 3 severe obesity with serious comorbidity and body mass index (BMI) of 45.0 to 49.9 in adult, unspecified obesity type ([facility])  PLAN:  Vitamin D Deficiency Laura Landngela was informed that low Vitamin D levels contributes to fatigue and are associated with obesity, breast, and colon cancer. She agrees to continue to take prescription Vit D @ 50,000 IU every week #4 with 0 refills and will follow-up for routine testing of Vitamin D, at least 2-3 times per year. She was informed of the risk  of over-replacement of Vitamin D and agrees to not increase her dose unless she discusses this with [place] first. Laura Brown agrees to ask her PCP to check her Vitamin D level when she has labs drawn. She will follow-up with our clinic in 2 weeks.  Depression with Emotional Eating Behaviors We discussed behavior modification techniques today to help Laura Brown deal with her emotional eating and depression. Laura Brown was given a refill on her Wellbutrin 200 mg #60 with 0 refills and agrees to follow-up with our clinic in 2 weeks.  Obesity Laura Brown is currently in the action stage of change. As such, her goal is to  continue with weight loss efforts. She has agreed to keep a food journal with 1200 calories and 80 grams of protein.  We discussed soft, easy to chew protein she can eat while recovering. Laura Brown has been instructed to work up to a goal of 150 minutes of combined cardio and strengthening exercise per week for weight loss and overall health benefits. We discussed the following Behavioral Modification Strategies today: increasing lean protein intake, work on meal planning and easy cooking plans, and better snacking choices. Laura Brown will continue Saxenda at 1.2.  Laura Brown has agreed to follow-up with our clinic in 2 weeks. She was informed of the importance of frequent follow-up visits to maximize her success with intensive lifestyle modifications for her multiple health conditions.  ALLERGIES: No Known Allergies  MEDICATIONS: Current Outpatient Medications on File Prior to Visit  Medication Sig Dispense Refill   DULoxetine (CYMBALTA) 60 MG capsule Take 60 mg by mouth 2 (two) times daily.     fexofenadine (ALLEGRA) 180 MG tablet Take 180 mg by mouth daily.     Insulin Pen Needle (BD PEN NEEDLE NANO 2ND GEN) 32G X 4 MM MISC 1 Device by Does not apply route daily. 100 each 0   Liraglutide -Weight Management (SAXENDA) 18 MG/3ML SOPN Inject 3 mg into the skin daily. 5 pen 0   predniSONE (DELTASONE) 20 MG tablet Take 20 mg by mouth. Take 3 tablets every morning for 10 days     verapamil (VERELAN) 100 MG 24 hr capsule Take 200 mg by mouth at bedtime.     No current facility-administered medications on file prior to visit.     PAST MEDICAL HISTORY: Past Medical History:  Diagnosis Date   Anxiety    Cluster headaches    Depression    Migraines    Osteoarthritis of both knees     PAST SURGICAL HISTORY: Past Surgical History:  Procedure Laterality Date   CESAREAN SECTION      SOCIAL HISTORY: Social History   Tobacco Use   Smoking status: Never Smoker   Smokeless tobacco:  Never Used  Substance Use Topics   Alcohol use: Not on file   Drug use: Not on file    FAMILY HISTORY: Family History  Problem Relation Age of Onset   Hyperlipidemia Mother    Hyperlipidemia Father    Hypertension Father    ROS: Review of Systems  Gastrointestinal: Negative for nausea and vomiting.  Musculoskeletal:       Negative for muscle weakness.  Psychiatric/Behavioral: Positive for depression (emotional eating). Negative for suicidal ideas.       Negative for homicidal ideas.   PHYSICAL EXAM: Pt in no acute distress  RECENT LABS AND TESTS: BMET    Component Value Date/Time   NA 140 11/07/2017 0901   NA 143 03/22/2011 1014   K 4.5 11/07/2017 0901   K  3.9 03/22/2011 1014   CL 100 11/07/2017 0901   CL 107 03/22/2011 1014   CO2 26 11/07/2017 0901   CO2 26 03/22/2011 1014   GLUCOSE 68 11/07/2017 0901   GLUCOSE 77 03/22/2011 1014   BUN 10 11/07/2017 0901   BUN 14 03/22/2011 1014   CREATININE 0.66 11/07/2017 0901   CREATININE 0.73 03/22/2011 1014   CALCIUM 9.1 11/07/2017 0901   CALCIUM 8.7 03/22/2011 1014   GFRNONAA 104 11/07/2017 0901   GFRNONAA >60 03/22/2011 1014   GFRAA 120 11/07/2017 0901   GFRAA >60 03/22/2011 1014   Lab Results  Component Value Date   HGBA1C 4.9 11/07/2017   Lab Results  Component Value Date   INSULIN 6.7 11/07/2017   CBC    Component Value Date/Time   WBC 6.8 11/07/2017 0901   WBC 6.3 03/22/2011 1014   RBC 4.73 11/07/2017 0901   RBC 4.70 03/22/2011 1014   HGB 14.8 11/07/2017 0901   HCT 43.6 11/07/2017 0901   PLT 277 03/22/2011 1014   MCV 92 11/07/2017 0901   MCV 93 03/22/2011 1014   MCH 31.3 11/07/2017 0901   MCH 30.5 03/22/2011 1014   MCHC 33.9 11/07/2017 0901   MCHC 32.8 03/22/2011 1014   RDW 11.2 (L) 11/07/2017 0901   RDW 11.3 (L) 03/22/2011 1014   LYMPHSABS 2.4 11/07/2017 0901   LYMPHSABS 2.0 03/22/2011 1014   MONOABS 0.3 03/22/2011 1014   EOSABS 0.1 11/07/2017 0901   EOSABS 0.1 03/22/2011 1014    BASOSABS 0.0 11/07/2017 0901   BASOSABS 0.0 03/22/2011 1014   Iron/TIBC/Ferritin/ %Sat    Component Value Date/Time   FERRITIN 32 03/22/2011 1014   Lipid Panel     Component Value Date/Time   CHOL 208 (H) 11/07/2017 0901   TRIG 71 11/07/2017 0901   HDL 63 11/07/2017 0901   LDLCALC 131 (H) 11/07/2017 0901   Hepatic Function Panel     Component Value Date/Time   PROT 7.1 11/07/2017 0901   PROT 7.4 03/22/2011 1014   ALBUMIN 4.2 11/07/2017 0901   ALBUMIN 3.6 03/22/2011 1014   AST 13 11/07/2017 0901   AST 21 03/22/2011 1014   ALT 12 11/07/2017 0901   ALT 15 03/22/2011 1014   ALKPHOS 61 11/07/2017 0901   ALKPHOS 54 03/22/2011 1014   BILITOT 0.3 11/07/2017 0901   BILITOT 0.5 03/22/2011 1014      Component Value Date/Time   TSH 0.984 11/07/2017 0901   TSH 1.66 03/22/2011 1014   Results for Laura FewSENTER, Sanai H (MRN [id number]) as of 08/14/2018 08:28  Ref. Range 11/07/2017 09:01  Vitamin D, 25-Hydroxy Latest Ref Range: 30.0 - 100.0 ng/mL 19.1 (L)   I, Marianna Paymentenise Haag, am acting as Energy managertranscriptionist for Quillian Quincearen Melquiades Kovar, MD I have reviewed the above documentation for accuracy and completeness, and I agree with the above. -Quillian Quincearen Brandilyn Nanninga, MD

## 2018-08-15 DIAGNOSIS — Z0000 Encounter for general adult medical examination without abnormal findings: Secondary | ICD-10-CM | POA: Diagnosis not present

## 2018-08-18 DIAGNOSIS — G510 Bell's palsy: Secondary | ICD-10-CM | POA: Diagnosis not present

## 2018-08-18 DIAGNOSIS — J069 Acute upper respiratory infection, unspecified: Secondary | ICD-10-CM | POA: Diagnosis not present

## 2018-08-21 DIAGNOSIS — Z6841 Body Mass Index (BMI) 40.0 and over, adult: Secondary | ICD-10-CM | POA: Diagnosis not present

## 2018-08-21 DIAGNOSIS — Z0000 Encounter for general adult medical examination without abnormal findings: Secondary | ICD-10-CM | POA: Diagnosis not present

## 2018-08-22 ENCOUNTER — Other Ambulatory Visit: Payer: Self-pay | Admitting: Family Medicine

## 2018-08-22 DIAGNOSIS — Z1231 Encounter for screening mammogram for malignant neoplasm of breast: Secondary | ICD-10-CM

## 2018-08-27 ENCOUNTER — Other Ambulatory Visit: Payer: Self-pay

## 2018-08-27 ENCOUNTER — Encounter (INDEPENDENT_AMBULATORY_CARE_PROVIDER_SITE_OTHER): Payer: Self-pay | Admitting: Family Medicine

## 2018-08-27 ENCOUNTER — Telehealth (INDEPENDENT_AMBULATORY_CARE_PROVIDER_SITE_OTHER): Payer: 59 | Admitting: Family Medicine

## 2018-08-27 DIAGNOSIS — E559 Vitamin D deficiency, unspecified: Secondary | ICD-10-CM | POA: Diagnosis not present

## 2018-08-27 DIAGNOSIS — Z6841 Body Mass Index (BMI) 40.0 and over, adult: Secondary | ICD-10-CM

## 2018-08-27 MED ORDER — VITAMIN D (ERGOCALCIFEROL) 1.25 MG (50000 UNIT) PO CAPS: 50000.0000 [IU] | ORAL_CAPSULE | ORAL | 0 refills | Status: DC

## 2018-08-28 NOTE — Progress Notes (Signed)
Office: [phone]  /  Fax: [phone] TeleHealth Visit:  Laura Brown has verbally consented to this TeleHealth visit today. The patient is located at home, the provider is located at the [facility] office. The participants in this visit include the listed provider and patient and any and all parties involved. The visit was conducted today via telephone. Laura Brown was unable to use realtime audiovisual technology today (FaceTime failed) and the telehealth visit was conducted via telephone.  HPI:   Chief Complaint: OBESITY Laura Brown is here to discuss her progress with her obesity treatment plan. She is on the keep a food journal with 1200 calories and 80 grams of protein daily plan and is following her eating plan approximately 0 % of the time. She states she is exercising 0 minutes 0 times per week. Laura Brown feels she has gained weight while on prednisone for Bells Palsy. She states she is ready to get back on track, and she would like to try journaling again. We were unable to weigh the patient today for this TeleHealth visit. She feels as if she has gained weight since her last visit. She has lost 16 lbs since starting treatment with us.  Vitamin D deficiency Laura Brown has a diagnosis of vitamin D deficiency. Laura Brown is stable on vit D and she denies nausea, vomiting or muscle weakness. Laura Brown had labs done at her PCP recently, but her vitamin D level wasn't checked.   ASSESSMENT AND PLAN:  Class 3 severe obesity with serious comorbidity and body mass index (BMI) of 45.0 to 49.9 in adult, unspecified obesity type (HCC)  Vitamin D deficiency - Plan: Vitamin D, Ergocalciferol, (DRISDOL) 1.25 MG (50000 UT) CAPS capsule, VITAMIN D 25 Hydroxy (Vit-D Deficiency, Fractures)  PLAN:  Vitamin D Deficiency Laura Brown was informed that low vitamin D levels contributes to fatigue and are associated with obesity, breast, and colon cancer. She agrees to continue to take prescription Vit D  @50 ,000 IU every week #4 with no refills  and will follow up for routine testing of vitamin D, at least 2-3 times per year. She was informed of the risk of over-replacement of vitamin D and agrees to not increase her dose unless she discusses this with us first. We will check labs and Laura Brown agrees to follow up with our clinic in 2 weeks.  Obesity Laura Brown is currently in the action stage of change. As such, her goal is to continue with weight loss efforts She has agreed to keep a food journal with 1200 to 1400 calories and 80+ grams of protein daily Laura Brown has been instructed to work up to a goal of 150 minutes of combined cardio and strengthening exercise per week for weight loss and overall health benefits. We discussed the following Behavioral Modification Strategies today: increase H2O intake, increasing lean protein intake, decreasing simple carbohydrates and decreasing sodium intake Laura Brown agrees to increase Saxenda to 1.5 mg (or 5 clicks past 1.2) and we will follow.  Laura Brown has agreed to follow up with our clinic in 2 weeks. She was informed of the importance of frequent follow up visits to maximize her success with intensive lifestyle modifications for her multiple health conditions.  ALLERGIES: No Known Allergies  MEDICATIONS: Current Outpatient Medications on File Prior to Visit  Medication Sig Dispense Refill  . buPROPion (WELLBUTRIN SR) 200 MG 12 hr tablet Take 1 tablet (200 mg total) by mouth 2 (two) times daily. 60 tablet 0  . DULoxetine (CYMBALTA) 60 MG capsule Take 60 mg  by mouth 2 (two) times daily.    . fexofenadine (ALLEGRA) 180 MG tablet Take 180 mg by mouth daily.    . Insulin Pen Needle (BD PEN NEEDLE NANO 2ND GEN) 32G X 4 MM MISC 1 Device by Does not apply route daily. 100 each 0  . Liraglutide -Weight Management (SAXENDA) 18 MG/3ML SOPN Inject 3 mg into the skin daily. 5 pen 0  . predniSONE (DELTASONE) 20 MG tablet Take 20 mg by mouth. Take 3 tablets every morning for  10 days    . verapamil (VERELAN) 100 MG 24 hr capsule Take 200 mg by mouth at bedtime.     No current facility-administered medications on file prior to visit.     PAST MEDICAL HISTORY: Past Medical History:  Diagnosis Date  . Anxiety   . Cluster headaches   . Depression   . Migraines   . Osteoarthritis of both knees     PAST SURGICAL HISTORY: Past Surgical History:  Procedure Laterality Date  . CESAREAN SECTION      SOCIAL HISTORY: Social History   Tobacco Use  . Smoking status: Never Smoker  . Smokeless tobacco: Never Used  Substance Use Topics  . Alcohol use: Not on file  . Drug use: Not on file    FAMILY HISTORY: Family History  Problem Relation Age of Onset  . Hyperlipidemia Mother   . Hyperlipidemia Father   . Hypertension Father     ROS: Review of Systems  Constitutional: Negative for weight loss.  Gastrointestinal: Negative for nausea and vomiting.  Musculoskeletal:       Negative for muscle weakness    PHYSICAL EXAM: Pt in no acute distress  RECENT LABS AND TESTS: BMET    Component Value Date/Time   NA 140 11/07/2017 0901   NA 143 03/22/2011 1014   K 4.5 11/07/2017 0901   K 3.9 03/22/2011 1014   CL 100 11/07/2017 0901   CL 107 03/22/2011 1014   CO2 26 11/07/2017 0901   CO2 26 03/22/2011 1014   GLUCOSE 68 11/07/2017 0901   GLUCOSE 77 03/22/2011 1014   BUN 10 11/07/2017 0901   BUN 14 03/22/2011 1014   CREATININE 0.66 11/07/2017 0901   CREATININE 0.73 03/22/2011 1014   CALCIUM 9.1 11/07/2017 0901   CALCIUM 8.7 03/22/2011 1014   GFRNONAA 104 11/07/2017 0901   GFRNONAA >60 03/22/2011 1014   GFRAA 120 11/07/2017 0901   GFRAA >60 03/22/2011 1014   Lab Results  Component Value Date   HGBA1C 4.9 11/07/2017   Lab Results  Component Value Date   INSULIN 6.7 11/07/2017   CBC    Component Value Date/Time   WBC 6.8 11/07/2017 0901   WBC 6.3 03/22/2011 1014   RBC 4.73 11/07/2017 0901   RBC 4.70 03/22/2011 1014   HGB 14.8 11/07/2017  0901   HCT 43.6 11/07/2017 0901   PLT 277 03/22/2011 1014   MCV 92 11/07/2017 0901   MCV 93 03/22/2011 1014   MCH 31.3 11/07/2017 0901   MCH 30.5 03/22/2011 1014   MCHC 33.9 11/07/2017 0901   MCHC 32.8 03/22/2011 1014   RDW 11.2 (L) 11/07/2017 0901   RDW 11.3 (L) 03/22/2011 1014   LYMPHSABS 2.4 11/07/2017 0901   LYMPHSABS 2.0 03/22/2011 1014   MONOABS 0.3 03/22/2011 1014   EOSABS 0.1 11/07/2017 0901   EOSABS 0.1 03/22/2011 1014   BASOSABS 0.0 11/07/2017 0901   BASOSABS 0.0 03/22/2011 1014   Iron/TIBC/Ferritin/ %Sat    Component Value Date/Time  FERRITIN 32 03/22/2011 1014   Lipid Panel     Component Value Date/Time   CHOL 208 (H) 11/07/2017 0901   TRIG 71 11/07/2017 0901   HDL 63 11/07/2017 0901   LDLCALC 131 (H) 11/07/2017 0901   Hepatic Function Panel     Component Value Date/Time   PROT 7.1 11/07/2017 0901   PROT 7.4 03/22/2011 1014   ALBUMIN 4.2 11/07/2017 0901   ALBUMIN 3.6 03/22/2011 1014   AST 13 11/07/2017 0901   AST 21 03/22/2011 1014   ALT 12 11/07/2017 0901   ALT 15 03/22/2011 1014   ALKPHOS 61 11/07/2017 0901   ALKPHOS 54 03/22/2011 1014   BILITOT 0.3 11/07/2017 0901   BILITOT 0.5 03/22/2011 1014      Component Value Date/Time   TSH 0.984 11/07/2017 0901   TSH 1.66 03/22/2011 1014     Ref. Range 11/07/2017 09:01  Vitamin D, 25-Hydroxy Latest Ref Range: 30.0 - 100.0 ng/mL 19.1 (L)    I, Doreene Nest, am acting as Location manager for Dennard Nip, MD I have reviewed the above documentation for accuracy and completeness, and I agree with the above. -Dennard Nip, MD

## 2018-09-02 MED FILL — VERAPAMIL ER PM 100 MG CAP: 100 | 90 days supply | Qty: 180 | Fill #0

## 2018-09-05 MED FILL — VIT D2 1.25 MG (50,000 UNIT: 1.25 MG | 28 days supply | Qty: 4 | Fill #0

## 2018-09-10 ENCOUNTER — Telehealth (INDEPENDENT_AMBULATORY_CARE_PROVIDER_SITE_OTHER): Payer: 59 | Admitting: Family Medicine

## 2018-09-17 MED FILL — DULOXETINE HCL 60 MG CPEP: 60 | 90 days supply | Qty: 180 | Fill #0

## 2018-09-23 ENCOUNTER — Other Ambulatory Visit: Payer: Self-pay

## 2018-09-23 ENCOUNTER — Encounter (INDEPENDENT_AMBULATORY_CARE_PROVIDER_SITE_OTHER): Payer: Self-pay | Admitting: Family Medicine

## 2018-09-23 ENCOUNTER — Telehealth (INDEPENDENT_AMBULATORY_CARE_PROVIDER_SITE_OTHER): Payer: 59 | Admitting: Family Medicine

## 2018-09-23 DIAGNOSIS — Z6841 Body Mass Index (BMI) 40.0 and over, adult: Secondary | ICD-10-CM

## 2018-09-23 DIAGNOSIS — E559 Vitamin D deficiency, unspecified: Secondary | ICD-10-CM

## 2018-09-23 DIAGNOSIS — F3289 Other specified depressive episodes: Secondary | ICD-10-CM

## 2018-09-23 MED ORDER — SAXENDA 18 MG/3ML ~~LOC~~ SOPN: 3.0000 mg | PEN_INJECTOR | Freq: Every day | SUBCUTANEOUS | 0 refills | Status: DC

## 2018-09-23 MED ORDER — VITAMIN D (ERGOCALCIFEROL) 1.25 MG (50000 UNIT) PO CAPS: 50000.0000 [IU] | ORAL_CAPSULE | ORAL | 0 refills | Status: DC

## 2018-09-23 MED FILL — SAXENDA 18 MG/3 ML PEN: 18 | 30 days supply | Qty: 15 | Fill #0

## 2018-09-23 NOTE — Progress Notes (Signed)
Office: [phone]  /  Fax: [phone] TeleHealth Visit:  Laura Brown has verbally consented to this TeleHealth visit today. The patient is located at home, the provider is located at the [facility] office. The participants in this visit include the listed provider and patient. The visit was conducted today via telephone call (14 minutes).  HPI:   Chief Complaint: OBESITY Laura Brown is here to discuss her progress with her obesity treatment plan. She is journaling 1200 calories and is following her eating plan approximately 50% of the time. She states she is exercising 0 minutes 0 times per week. Laura Brown has been off track due to stress and habit. She states her father-in-law died last week which has increased her stress. She reports her weight at home today to be 270 lbs. She is on Saxenda 1.5 mg daily and feels this decreases her appetite. She is tired of eating the meat on the plan. We were unable to weigh the patient today for this TeleHealth visit. She feels as if she has maintained her weight since her last visit. She has lost 16 lbs since starting treatment with [place].  Depression with emotional eating behaviors Laura Brown is struggling with emotional eating and using food for comfort to the extent that it is negatively impacting her health. She often snacks when she is not hungry. Laura Brown sometimes feels she is out of control and then feels guilty that she made poor food choices. She has been working on behavior modification techniques to help reduce her emotional eating and has been somewhat successful. Laura Brown is struggling with eating at night. She had seen Dr. Mallie Mussel but felt emotional eating was well controlled and then discontinued therapy but now is struggling with this again. She shows no sign of suicidal or homicidal ideations.  Depression screen [facility] 2/9 02/24/2018 02/03/2018 12/23/2017 12/05/2017 11/20/2017  Decreased Interest 0 0 0 0 1  Down, Depressed, Hopeless 0 0 0 0  1  PHQ - 2 Score 0 0 0 0 2  Altered sleeping 0 1 0 0 0  Tired, decreased energy 0 0 0 1 2  Change in appetite 0 0 0 0 1  Feeling bad or failure about yourself  0 1 0 0 1  Trouble concentrating 0 0 0 0 0  Moving slowly or fidgety/restless 0 0 0 0 0  Suicidal thoughts 0 0 0 0 0  PHQ-9 Score 0 2 0 1 6  Difficult doing work/chores - - - - -   Vitamin D deficiency Laura Brown has a diagnosis of Vitamin D deficiency, which is not at goal. Last Vitamin D was 19.1 on 11/07/2017. She is currently taking prescription Vit D and denies nausea, vomiting or muscle weakness.  ASSESSMENT AND PLAN:  Vitamin D deficiency - Plan: Vitamin D, Ergocalciferol, (DRISDOL) 1.25 MG (50000 UT) CAPS capsule  Other depression  Class 3 severe obesity with serious comorbidity and body mass index (BMI) of 45.0 to 49.9 in adult, unspecified obesity type ([place]) - Plan: Liraglutide -Weight Management (SAXENDA) 18 MG/3ML SOPN  PLAN:  Depression with Emotional Eating Behaviors We discussed behavior modification techniques today to help Laura Brown deal with her emotional eating and depression. Laura Brown was encouraged to make an appointment with Dr. Mallie Mussel. She will continue bupropion as directed.  Vitamin D Deficiency Laura Brown was informed that low Vitamin D levels contributes to fatigue and are associated with obesity, breast, and colon cancer. She agrees to continue to take prescription Vit D @ 50,000 IU every week #  4 with 0 refills and will follow-up for routine testing of Vitamin D, at least 2-3 times per year. She was informed of the risk of over-replacement of Vitamin D and agrees to not increase her dose unless she discusses this with us first. Laura Brown agrees to follow-up with our clinic in 2-3 weeks.  Obesity Laura Brown is currently in the action stage of change. As such, her goal is to continue with weight loss efforts. She has agreed to follow our protein rich vegetarian plan. Handouts were sent via MyChart on the vegetarian  plan. She will switch to the vegetarian plan to get back on track and to reduce the meat she is eating which she desires. Laura Brown has been instructed to work up to a goal of 150 minutes of combined cardio and strengthening exercise per week for weight loss and overall health benefits. We discussed the following Behavioral Modification Strategies today: increasing lean protein intake and decreasing simple carbohydrates, and planning for success.  Laura Brown will continue Saxenda 1.5 mg SQ daily.  Laura Brown has agreed to follow-up with our clinic in 2-3 weeks. She was informed of the importance of frequent follow-up visits to maximize her success with intensive lifestyle modifications for her multiple health conditions.  ALLERGIES: No Known Allergies  MEDICATIONS: Current Outpatient Medications on File Prior to Visit  Medication Sig Dispense Refill  . buPROPion (WELLBUTRIN SR) 200 MG 12 hr tablet Take 1 tablet (200 mg total) by mouth 2 (two) times daily. 60 tablet 0  . DULoxetine (CYMBALTA) 60 MG capsule Take 60 mg by mouth 2 (two) times daily.    . fexofenadine (ALLEGRA) 180 MG tablet Take 180 mg by mouth daily.    . Insulin Pen Needle (BD PEN NEEDLE NANO 2ND GEN) 32G X 4 MM MISC 1 Device by Does not apply route daily. 100 each 0  . verapamil (VERELAN) 100 MG 24 hr capsule Take 200 mg by mouth at bedtime.     No current facility-administered medications on file prior to visit.     PAST MEDICAL HISTORY: Past Medical History:  Diagnosis Date  . Anxiety   . Cluster headaches   . Depression   . Migraines   . Osteoarthritis of both knees     PAST SURGICAL HISTORY: Past Surgical History:  Procedure Laterality Date  . CESAREAN SECTION      SOCIAL HISTORY: Social History   Tobacco Use  . Smoking status: Never Smoker  . Smokeless tobacco: Never Used  Substance Use Topics  . Alcohol use: Not on file  . Drug use: Not on file    FAMILY HISTORY: Family History  Problem Relation Age of  Onset  . Hyperlipidemia Mother   . Hyperlipidemia Father   . Hypertension Father    ROS: Review of Systems  Gastrointestinal: Negative for nausea and vomiting.  Musculoskeletal:       Negative for muscle weakness.  Psychiatric/Behavioral: Positive for depression (emotional eating). Negative for suicidal ideas.       Negative for homicidal ideas.   PHYSICAL EXAM: Pt in no acute distress  RECENT LABS AND TESTS: BMET    Component Value Date/Time   NA 140 11/07/2017 0901   NA 143 03/22/2011 1014   K 4.5 11/07/2017 0901   K 3.9 03/22/2011 1014   CL 100 11/07/2017 0901   CL 107 03/22/2011 1014   CO2 26 11/07/2017 0901   CO2 26 03/22/2011 1014   GLUCOSE 68 11/07/2017 0901   GLUCOSE 77 03/22/2011 1014  BUN 10 11/07/2017 0901   BUN 14 03/22/2011 1014   CREATININE 0.66 11/07/2017 0901   CREATININE 0.73 03/22/2011 1014   CALCIUM 9.1 11/07/2017 0901   CALCIUM 8.7 03/22/2011 1014   GFRNONAA 104 11/07/2017 0901   GFRNONAA >60 03/22/2011 1014   GFRAA 120 11/07/2017 0901   GFRAA >60 03/22/2011 1014   Lab Results  Component Value Date   HGBA1C 4.9 11/07/2017   Lab Results  Component Value Date   INSULIN 6.7 11/07/2017   CBC    Component Value Date/Time   WBC 6.8 11/07/2017 0901   WBC 6.3 03/22/2011 1014   RBC 4.73 11/07/2017 0901   RBC 4.70 03/22/2011 1014   HGB 14.8 11/07/2017 0901   HCT 43.6 11/07/2017 0901   PLT 277 03/22/2011 1014   MCV 92 11/07/2017 0901   MCV 93 03/22/2011 1014   MCH 31.3 11/07/2017 0901   MCH 30.5 03/22/2011 1014   MCHC 33.9 11/07/2017 0901   MCHC 32.8 03/22/2011 1014   RDW 11.2 (L) 11/07/2017 0901   RDW 11.3 (L) 03/22/2011 1014   LYMPHSABS 2.4 11/07/2017 0901   LYMPHSABS 2.0 03/22/2011 1014   MONOABS 0.3 03/22/2011 1014   EOSABS 0.1 11/07/2017 0901   EOSABS 0.1 03/22/2011 1014   BASOSABS 0.0 11/07/2017 0901   BASOSABS 0.0 03/22/2011 1014   Iron/TIBC/Ferritin/ %Sat    Component Value Date/Time   FERRITIN 32 03/22/2011 1014    Lipid Panel     Component Value Date/Time   CHOL 208 (H) 11/07/2017 0901   TRIG 71 11/07/2017 0901   HDL 63 11/07/2017 0901   LDLCALC 131 (H) 11/07/2017 0901   Hepatic Function Panel     Component Value Date/Time   PROT 7.1 11/07/2017 0901   PROT 7.4 03/22/2011 1014   ALBUMIN 4.2 11/07/2017 0901   ALBUMIN 3.6 03/22/2011 1014   AST 13 11/07/2017 0901   AST 21 03/22/2011 1014   ALT 12 11/07/2017 0901   ALT 15 03/22/2011 1014   ALKPHOS 61 11/07/2017 0901   ALKPHOS 54 03/22/2011 1014   BILITOT 0.3 11/07/2017 0901   BILITOT 0.5 03/22/2011 1014      Component Value Date/Time   TSH 0.984 11/07/2017 0901   TSH 1.66 03/22/2011 1014   Results for Lilli FewSENTER, Chanika H (MRN [id number]) as of 09/23/2018 15:09  Ref. Range 11/07/2017 09:01  Vitamin D, 25-Hydroxy Latest Ref Range: 30.0 - 100.0 ng/mL 19.1 (L)   I, Marianna Paymentenise Haag, am acting as Energy managertranscriptionist for AshlandDawn Deane Wattenbarger, FNP  I have reviewed the above documentation for accuracy and completeness, and I agree with the above.  - Sharne Linders, FNP-C.

## 2018-10-15 ENCOUNTER — Telehealth (INDEPENDENT_AMBULATORY_CARE_PROVIDER_SITE_OTHER): Payer: 59 | Admitting: Family Medicine

## 2018-10-15 ENCOUNTER — Encounter (INDEPENDENT_AMBULATORY_CARE_PROVIDER_SITE_OTHER): Payer: Self-pay | Admitting: Family Medicine

## 2018-10-15 ENCOUNTER — Other Ambulatory Visit: Payer: Self-pay

## 2018-10-15 DIAGNOSIS — Z6841 Body Mass Index (BMI) 40.0 and over, adult: Secondary | ICD-10-CM | POA: Diagnosis not present

## 2018-10-15 DIAGNOSIS — E559 Vitamin D deficiency, unspecified: Secondary | ICD-10-CM

## 2018-10-15 DIAGNOSIS — F3289 Other specified depressive episodes: Secondary | ICD-10-CM

## 2018-10-15 MED ORDER — VITAMIN D (ERGOCALCIFEROL) 1.25 MG (50000 UNIT) PO CAPS: 50000.0000 [IU] | ORAL_CAPSULE | ORAL | 0 refills | Status: DC

## 2018-10-15 MED ORDER — BUPROPION HCL ER (SR) 200 MG PO TB12: 200.0000 mg | ORAL_TABLET | Freq: Two times a day (BID) | ORAL | 0 refills | Status: DC

## 2018-10-15 MED FILL — BUPROPION HCL SR 200 MG TAB: 200 | 30 days supply | Qty: 60 | Fill #0

## 2018-10-15 MED FILL — VIT D2 1.25 MG (50,000 UNIT: 1.25 MG | 28 days supply | Qty: 4 | Fill #0

## 2018-10-20 NOTE — Progress Notes (Signed)
Office: [phone]  /  Fax: [phone] TeleHealth Visit:  Laura Brown has verbally consented to this TeleHealth visit today. The patient is located at home, the provider is located at the [facility] office. The participants in this visit include the listed provider and patient. Laura Brown was unable to use realtime audiovisual technology today and the telehealth visit was conducted via telephone.   HPI:   Chief Complaint: OBESITY Laura Brown is here to discuss her progress with her obesity treatment plan. She is on the protein rich vegetarian plan and is following her eating plan approximately 50 % of the time. She states she is exercising 0 minutes 0 times per week. Laura Brown continues to struggle with weight loss. She thinks she has gained approximately 5 lbs. She is on Saxenda and notes decreased hunger but struggling with emotional eating.  We were unable to weigh the patient today for this TeleHealth visit. She feels as if she has gained 5 lbs since her last visit. She has lost 16 lbs since starting treatment with us.  Vitamin D Deficiency Laura Brown has a diagnosis of vitamin D deficiency. She is stable on prescription Vit D and denies nausea, vomiting or muscle weakness. She is overdue for labs and has been encouraged to have them checked as soon as possible. She was reminded again today.  Depression with Emotional Eating Behaviors Laura Brown continues to struggle with emotional eating. 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 She has turned to comfort eating more often. She using food for comfort to the extent that it is negatively impacting her health. She often snacks when she is not hungry. Laura Brown sometimes feels she is out of control and then feels guilty that she made poor food choices. She has been working on behavior modification techniques to help reduce her emotional eating and has been somewhat successful. She  shows no sign of suicidal or homicidal ideations.  Depression screen [facility] 2/9 02/24/2018 02/03/2018 12/23/2017 12/05/2017 11/20/2017  Decreased Interest 0 0 0 0 1  Down, Depressed, Hopeless 0 0 0 0 1  PHQ - 2 Score 0 0 0 0 2  Altered sleeping 0 1 0 0 0  Tired, decreased energy 0 0 0 1 2  Change in appetite 0 0 0 0 1  Feeling bad or failure about yourself  0 1 0 0 1  Trouble concentrating 0 0 0 0 0  Moving slowly or fidgety/restless 0 0 0 0 0  Suicidal thoughts 0 0 0 0 0  PHQ-9 Score 0 2 0 1 6  Difficult doing work/chores - - - - -    ASSESSMENT AND PLAN:  Vitamin D deficiency - Plan: Vitamin D, Ergocalciferol, (DRISDOL) 1.25 MG (50000 UT) CAPS capsule, VITAMIN D 25 Hydroxy (Vit-D Deficiency, Fractures)  Other depression - with emotional eating - Plan: buPROPion (WELLBUTRIN SR) 200 MG 12 hr tablet  Class 3 severe obesity with serious comorbidity and body mass index (BMI) of 45.0 to 49.9 in adult, unspecified obesity type (HCC)  PLAN:  Vitamin D Deficiency Laura Brown was informed that low vitamin D levels contributes to fatigue and are associated with obesity, breast, and colon cancer. Laura Brown agrees to continue taking prescription Vit D 50,000 IU every week #4 and we will refill for 1 month. She will follow up for routine testing of vitamin D, at least 2-3 times per year. She was informed of the risk of over-replacement of vitamin D and  agrees to not increase her dose unless she discusses this with [place] first. Aryana agrees to follow up with our clinic in 2 weeks.  Depression with Emotional Eating Behaviors We discussed cognitive behavioral therapy today to help decrease emotional eating and depression. Vianka agrees to continue taking Wellbutrin SR 200 mg BID #60 and we will refill for 1 month, and will continue taking Cymbalta. Bharti agrees to follow up with our clinic in 2 weeks.   Obesity Ayzha is currently in the action stage of change. As such, her goal is to continue with weight loss  efforts She has agreed to keep a food journal with 1200 calories and 75+ grams of protein daily  Jarita has been instructed to work up to a goal of 150 minutes of combined cardio and strengthening exercise per week for weight loss and overall health benefits. We discussed the following Behavioral Modification Strategies today: increasing lean protein intake and emotional eating strategies We discussed various medication options to help Korianna with her weight loss efforts and we both agreed to continue taking Saxenda at 1.5 mg (5 clicks past 1.2 mg).  [name] has agreed to follow up with our clinic in 2 weeks. She was informed of the importance of frequent follow up visits to maximize her success with intensive lifestyle modifications for her multiple health conditions.  ALLERGIES: No Known Allergies  MEDICATIONS: Current Outpatient Medications on File Prior to Visit  Medication Sig Dispense Refill  . DULoxetine (CYMBALTA) 60 MG capsule Take 60 mg by mouth 2 (two) times daily.    . fexofenadine (ALLEGRA) 180 MG tablet Take 180 mg by mouth daily.    . Insulin Pen Needle (BD PEN NEEDLE NANO 2ND GEN) 32G X 4 MM MISC 1 Device by Does not apply route daily. 100 each 0  . Liraglutide -Weight Management (SAXENDA) 18 MG/3ML SOPN Inject 3 mg into the skin daily. 5 pen 0  . verapamil (VERELAN) 100 MG 24 hr capsule Take 200 mg by mouth at bedtime.     No current facility-administered medications on file prior to visit.     PAST MEDICAL HISTORY: Past Medical History:  Diagnosis Date  . Anxiety   . Cluster headaches   . Depression   . Migraines   . Osteoarthritis of both knees     PAST SURGICAL HISTORY: Past Surgical History:  Procedure Laterality Date  . CESAREAN SECTION      SOCIAL HISTORY: Social History   Tobacco Use  . Smoking status: Never Smoker  . Smokeless tobacco: Never Used  Substance Use Topics  . Alcohol use: Not on file  . Drug use: Not on file    FAMILY HISTORY:  Family History  Problem Relation Age of Onset  . Hyperlipidemia Mother   . Hyperlipidemia Father   . Hypertension Father     ROS: Review of Systems  Constitutional: Negative for weight loss.  Gastrointestinal: Negative for nausea and vomiting.  Musculoskeletal:       Negative muscle weakness  Psychiatric/Behavioral: Positive for depression. Negative for suicidal ideas.    PHYSICAL EXAM: Pt in no acute distress  RECENT LABS AND TESTS: BMET    Component Value Date/Time   NA 140 11/07/2017 0901   NA 143 03/22/2011 1014   K 4.5 11/07/2017 0901   K 3.9 03/22/2011 1014   CL 100 11/07/2017 0901   CL 107 03/22/2011 1014   CO2 26 11/07/2017 0901   CO2 26 03/22/2011 1014   GLUCOSE 68 11/07/2017 0901  GLUCOSE 77 03/22/2011 1014   BUN 10 11/07/2017 0901   BUN 14 03/22/2011 1014   CREATININE 0.66 11/07/2017 0901   CREATININE 0.73 03/22/2011 1014   CALCIUM 9.1 11/07/2017 0901   CALCIUM 8.7 03/22/2011 1014   GFRNONAA 104 11/07/2017 0901   GFRNONAA >60 03/22/2011 1014   GFRAA 120 11/07/2017 0901   GFRAA >60 03/22/2011 1014   Lab Results  Component Value Date   HGBA1C 4.9 11/07/2017   Lab Results  Component Value Date   INSULIN 6.7 11/07/2017   CBC    Component Value Date/Time   WBC 6.8 11/07/2017 0901   WBC 6.3 03/22/2011 1014   RBC 4.73 11/07/2017 0901   RBC 4.70 03/22/2011 1014   HGB 14.8 11/07/2017 0901   HCT 43.6 11/07/2017 0901   PLT 277 03/22/2011 1014   MCV 92 11/07/2017 0901   MCV 93 03/22/2011 1014   MCH 31.3 11/07/2017 0901   MCH 30.5 03/22/2011 1014   MCHC 33.9 11/07/2017 0901   MCHC 32.8 03/22/2011 1014   RDW 11.2 (L) 11/07/2017 0901   RDW 11.3 (L) 03/22/2011 1014   LYMPHSABS 2.4 11/07/2017 0901   LYMPHSABS 2.0 03/22/2011 1014   MONOABS 0.3 03/22/2011 1014   EOSABS 0.1 11/07/2017 0901   EOSABS 0.1 03/22/2011 1014   BASOSABS 0.0 11/07/2017 0901   BASOSABS 0.0 03/22/2011 1014   Iron/TIBC/Ferritin/ %Sat    Component Value Date/Time    FERRITIN 32 03/22/2011 1014   Lipid Panel     Component Value Date/Time   CHOL 208 (H) 11/07/2017 0901   TRIG 71 11/07/2017 0901   HDL 63 11/07/2017 0901   LDLCALC 131 (H) 11/07/2017 0901   Hepatic Function Panel     Component Value Date/Time   PROT 7.1 11/07/2017 0901   PROT 7.4 03/22/2011 1014   ALBUMIN 4.2 11/07/2017 0901   ALBUMIN 3.6 03/22/2011 1014   AST 13 11/07/2017 0901   AST 21 03/22/2011 1014   ALT 12 11/07/2017 0901   ALT 15 03/22/2011 1014   ALKPHOS 61 11/07/2017 0901   ALKPHOS 54 03/22/2011 1014   BILITOT 0.3 11/07/2017 0901   BILITOT 0.5 03/22/2011 1014      Component Value Date/Time   TSH 0.984 11/07/2017 0901   TSH 1.66 03/22/2011 1014      I, Trixie Dredge, am acting as Location manager for Dennard Nip, MD I have reviewed the above documentation for accuracy and completeness, and I agree with the above. -Dennard Nip, MD

## 2018-10-23 DIAGNOSIS — M1712 Unilateral primary osteoarthritis, left knee: Secondary | ICD-10-CM | POA: Diagnosis not present

## 2018-10-23 DIAGNOSIS — M1711 Unilateral primary osteoarthritis, right knee: Secondary | ICD-10-CM | POA: Diagnosis not present

## 2018-10-23 MED FILL — traMADol HCL 50 MG TABS: 50 | 30 days supply | Qty: 60 | Fill #0

## 2018-10-28 DIAGNOSIS — M170 Bilateral primary osteoarthritis of knee: Secondary | ICD-10-CM | POA: Diagnosis not present

## 2018-10-29 ENCOUNTER — Encounter (INDEPENDENT_AMBULATORY_CARE_PROVIDER_SITE_OTHER): Payer: Self-pay | Admitting: Family Medicine

## 2018-10-29 ENCOUNTER — Ambulatory Visit: Payer: 59

## 2018-10-29 ENCOUNTER — Encounter (INDEPENDENT_AMBULATORY_CARE_PROVIDER_SITE_OTHER): Payer: Self-pay

## 2018-10-29 ENCOUNTER — Other Ambulatory Visit: Payer: Self-pay

## 2018-10-29 ENCOUNTER — Telehealth (INDEPENDENT_AMBULATORY_CARE_PROVIDER_SITE_OTHER): Payer: 59 | Admitting: Family Medicine

## 2018-10-29 DIAGNOSIS — E559 Vitamin D deficiency, unspecified: Secondary | ICD-10-CM | POA: Diagnosis not present

## 2018-10-29 DIAGNOSIS — Z6841 Body Mass Index (BMI) 40.0 and over, adult: Secondary | ICD-10-CM

## 2018-10-29 DIAGNOSIS — E7849 Other hyperlipidemia: Secondary | ICD-10-CM

## 2018-10-29 NOTE — Progress Notes (Signed)
Office: [phone]  /  Fax: [phone] TeleHealth Visit:  Laura Brown has verbally consented to this TeleHealth visit today. The patient is located at home, the provider is located at the [facility] office. The participants in this visit include the listed provider and patient. Laura Brown was unable to use Face Time today and the telehealth visit was conducted via telephone.  HPI:   Chief Complaint: OBESITY Laura Brown is here to discuss her progress with her obesity treatment plan. She is on the  keep a food journal with 1200 calories and 75+ grams of protein and is following her eating plan approximately 75 % of the time. She states she is exercising 0 minutes 0 times per week. Laura Brown is doing well with weight loss and she feels that she has lost another 1 to 2 pounds. She is happy at her new job and is getting into a routine where she can do more meal planning.  We were unable to weigh the patient today for this TeleHealth visit. She feels as if she has lost a little weight since her last visit. She has lost 16 lbs since starting treatment with us.  Hyperlipidemia Laura Brown has hyperlipidemia and is working to improve her cholesterol levels with intensive lifestyle modification including a low saturated fat diet, exercise, and weight loss. She denies any chest pain. Laura Brown is due for labs.  Vitamin D Deficiency Laura Brown has a diagnosis of vitamin D deficiency. She is currently stable on vit D, but is overdue for labs. Laura Brown denies nausea, vomiting, or muscle weakness.  ASSESSMENT AND PLAN:  Vitamin D deficiency - Plan: VITAMIN D 25 Hydroxy (Vit-D Deficiency, Fractures)  Other hyperlipidemia - Plan: Comprehensive metabolic panel, Hemoglobin A1c, Insulin, random, Lipid Panel With LDL/HDL Ratio  Class 3 severe obesity with serious comorbidity and body mass index (BMI) of 45.0 to 49.9 in adult, unspecified obesity type (HCC)  PLAN:  Hyperlipidemia Laura Brown was informed of the  American Heart Association Guidelines emphasizing intensive lifestyle modifications as the first line treatment for hyperlipidemia. We discussed many lifestyle modifications today in depth, and Laura Brown will continue to work on decreasing saturated fats such as fatty red meat, butter and many fried foods. She will also increase vegetables and lean protein in her diet and continue to work on exercise and weight loss efforts. Labs were ordered and Laura Brown agrees to continue her diet and exercise. She agrees to follow up in 3 weeks.  Vitamin D Deficiency Laura Brown was informed that low vitamin D levels contribute to fatigue and are associated with obesity, breast, and colon cancer. Laura Brown agrees to continue to take prescription Vit D @50 ,000 IU every week and will follow up for routine testing of vitamin D, at least 2-3 times per year. She was informed of the risk of over-replacement of vitamin D and agrees to not increase her dose unless she discusses this with us first. Labs were ordered today and Laura Brown agrees to follow up in 3 weeks as directed.  I spent > than 50% of the 25 minute visit on counseling as documented in the note.  Obesity Laura Brown is currently in the action stage of change. As such, her goal is to continue with weight loss efforts She has agreed to keep a food journal with 1200 calories and 75+ grams of protein. Laura Brown was sent the Easy to Prep chicken dinners recipe handout. Laura Brown has been instructed to work up to a goal of 150 minutes of combined cardio and strengthening exercise per  week for weight loss and overall health benefits. We discussed the following Behavioral Modification Strategies today: increasing lean protein intake and work on meal planning and easy cooking plans.  Laura Brown has agreed to follow up with our clinic in 3 weeks. She was informed of the importance of frequent follow up visits to maximize her success with intensive lifestyle modifications for her multiple health  conditions.  ALLERGIES: No Known Allergies  MEDICATIONS: Current Outpatient Medications on File Prior to Visit  Medication Sig Dispense Refill  . buPROPion (WELLBUTRIN SR) 200 MG 12 hr tablet Take 1 tablet (200 mg total) by mouth 2 (two) times daily. 60 tablet 0  . DULoxetine (CYMBALTA) 60 MG capsule Take 60 mg by mouth 2 (two) times daily.    . fexofenadine (ALLEGRA) 180 MG tablet Take 180 mg by mouth daily.    . Insulin Pen Needle (BD PEN NEEDLE NANO 2ND GEN) 32G X 4 MM MISC 1 Device by Does not apply route daily. 100 each 0  . Liraglutide -Weight Management (SAXENDA) 18 MG/3ML SOPN Inject 3 mg into the skin daily. 5 pen 0  . verapamil (VERELAN) 100 MG 24 hr capsule Take 200 mg by mouth at bedtime.    . Vitamin D, Ergocalciferol, (DRISDOL) 1.25 MG (50000 UT) CAPS capsule Take 1 capsule (50,000 Units total) by mouth every 7 (seven) days. 4 capsule 0   No current facility-administered medications on file prior to visit.     PAST MEDICAL HISTORY: Past Medical History:  Diagnosis Date  . Anxiety   . Cluster headaches   . Depression   . Migraines   . Osteoarthritis of both knees     PAST SURGICAL HISTORY: Past Surgical History:  Procedure Laterality Date  . CESAREAN SECTION      SOCIAL HISTORY: Social History   Tobacco Use  . Smoking status: Never Smoker  . Smokeless tobacco: Never Used  Substance Use Topics  . Alcohol use: Not on file  . Drug use: Not on file    FAMILY HISTORY: Family History  Problem Relation Age of Onset  . Hyperlipidemia Mother   . Hyperlipidemia Father   . Hypertension Father     ROS: Review of Systems  Cardiovascular: Negative for chest pain.  Gastrointestinal: Negative for nausea and vomiting.  Musculoskeletal:       Negative for muscle weakness.    PHYSICAL EXAM: Pt in no acute distress  RECENT LABS AND TESTS: BMET    Component Value Date/Time   NA 140 11/07/2017 0901   NA 143 03/22/2011 1014   K 4.5 11/07/2017 0901   K 3.9  03/22/2011 1014   CL 100 11/07/2017 0901   CL 107 03/22/2011 1014   CO2 26 11/07/2017 0901   CO2 26 03/22/2011 1014   GLUCOSE 68 11/07/2017 0901   GLUCOSE 77 03/22/2011 1014   BUN 10 11/07/2017 0901   BUN 14 03/22/2011 1014   CREATININE 0.66 11/07/2017 0901   CREATININE 0.73 03/22/2011 1014   CALCIUM 9.1 11/07/2017 0901   CALCIUM 8.7 03/22/2011 1014   GFRNONAA 104 11/07/2017 0901   GFRNONAA >60 03/22/2011 1014   GFRAA 120 11/07/2017 0901   GFRAA >60 03/22/2011 1014   Lab Results  Component Value Date   HGBA1C 4.9 11/07/2017   Lab Results  Component Value Date   INSULIN 6.7 11/07/2017   CBC    Component Value Date/Time   WBC 6.8 11/07/2017 0901   WBC 6.3 03/22/2011 1014   RBC 4.73 11/07/2017 0901  RBC 4.70 03/22/2011 1014   HGB 14.8 11/07/2017 0901   HCT 43.6 11/07/2017 0901   PLT 277 03/22/2011 1014   MCV 92 11/07/2017 0901   MCV 93 03/22/2011 1014   MCH 31.3 11/07/2017 0901   MCH 30.5 03/22/2011 1014   MCHC 33.9 11/07/2017 0901   MCHC 32.8 03/22/2011 1014   RDW 11.2 (L) 11/07/2017 0901   RDW 11.3 (L) 03/22/2011 1014   LYMPHSABS 2.4 11/07/2017 0901   LYMPHSABS 2.0 03/22/2011 1014   MONOABS 0.3 03/22/2011 1014   EOSABS 0.1 11/07/2017 0901   EOSABS 0.1 03/22/2011 1014   BASOSABS 0.0 11/07/2017 0901   BASOSABS 0.0 03/22/2011 1014   Iron/TIBC/Ferritin/ %Sat    Component Value Date/Time   FERRITIN 32 03/22/2011 1014   Lipid Panel     Component Value Date/Time   CHOL 208 (H) 11/07/2017 0901   TRIG 71 11/07/2017 0901   HDL 63 11/07/2017 0901   LDLCALC 131 (H) 11/07/2017 0901   Hepatic Function Panel     Component Value Date/Time   PROT 7.1 11/07/2017 0901   PROT 7.4 03/22/2011 1014   ALBUMIN 4.2 11/07/2017 0901   ALBUMIN 3.6 03/22/2011 1014   AST 13 11/07/2017 0901   AST 21 03/22/2011 1014   ALT 12 11/07/2017 0901   ALT 15 03/22/2011 1014   ALKPHOS 61 11/07/2017 0901   ALKPHOS 54 03/22/2011 1014   BILITOT 0.3 11/07/2017 0901   BILITOT 0.5  03/22/2011 1014      Component Value Date/Time   TSH 0.984 11/07/2017 0901   TSH 1.66 03/22/2011 1014   Results for JACINDA, KANADY (MRN [id number]) as of 10/29/2018 12:39  Ref. Range 11/07/2017 09:01  Vitamin D, 25-Hydroxy Latest Ref Range: 30.0 - 100.0 ng/mL 19.1 (L)     I, Kirke Corin, CMA, am acting as transcriptionist for Wilder Glade, MD I have reviewed the above documentation for accuracy and completeness, and I agree with the above. -Quillian Quince, MD

## 2018-11-18 ENCOUNTER — Encounter (INDEPENDENT_AMBULATORY_CARE_PROVIDER_SITE_OTHER): Payer: Self-pay

## 2018-11-19 ENCOUNTER — Other Ambulatory Visit: Payer: Self-pay

## 2018-11-19 ENCOUNTER — Encounter (INDEPENDENT_AMBULATORY_CARE_PROVIDER_SITE_OTHER): Payer: Self-pay | Admitting: Family Medicine

## 2018-11-19 ENCOUNTER — Telehealth (INDEPENDENT_AMBULATORY_CARE_PROVIDER_SITE_OTHER): Payer: 59 | Admitting: Family Medicine

## 2018-11-19 DIAGNOSIS — E559 Vitamin D deficiency, unspecified: Secondary | ICD-10-CM | POA: Diagnosis not present

## 2018-11-19 DIAGNOSIS — F3289 Other specified depressive episodes: Secondary | ICD-10-CM

## 2018-11-19 DIAGNOSIS — Z6841 Body Mass Index (BMI) 40.0 and over, adult: Secondary | ICD-10-CM | POA: Diagnosis not present

## 2018-11-19 MED ORDER — VITAMIN D (ERGOCALCIFEROL) 1.25 MG (50000 UNIT) PO CAPS: 50000.0000 [IU] | ORAL_CAPSULE | ORAL | 0 refills | Status: DC

## 2018-11-19 MED ORDER — BUPROPION HCL ER (SR) 200 MG PO TB12: 200.0000 mg | ORAL_TABLET | Freq: Two times a day (BID) | ORAL | 0 refills | Status: DC

## 2018-11-19 MED FILL — VIT D2 1.25 MG (50,000 UNIT: 1.25 MG | 28 days supply | Qty: 4 | Fill #0

## 2018-11-19 MED FILL — BUPROPION HCL SR 200 MG TAB: 200 | 30 days supply | Qty: 60 | Fill #0

## 2018-11-20 ENCOUNTER — Ambulatory Visit (INDEPENDENT_AMBULATORY_CARE_PROVIDER_SITE_OTHER): Payer: 59

## 2018-11-20 ENCOUNTER — Other Ambulatory Visit: Payer: Self-pay

## 2018-11-20 DIAGNOSIS — Z1231 Encounter for screening mammogram for malignant neoplasm of breast: Secondary | ICD-10-CM | POA: Diagnosis not present

## 2018-11-20 NOTE — Progress Notes (Signed)
Office: [phone]  /  Fax: [phone] TeleHealth Visit:  Laura Brown has verbally consented to this TeleHealth visit today. The patient is located at home, the provider is located at the [facility] office. The participants in this visit include the listed provider and patient. Laura Brown was unable to use realtime audiovisual technology today and the telehealth visit was conducted via telephone.   HPI:   Chief Complaint: OBESITY Laura Brown is here to discuss her progress with her obesity treatment plan. She is on the keep a food journal with 1200 calories and 75+ grams of protein daily and is following her eating plan approximately 75 % of the time. She states she is walking for 20 minutes 2 times per week. Laura Brown continues to do well with weight loss. She feels she has lost another 1-2 lbs. She has increased walking with the nice weather. She struggles with meeting her protein goals. She ia worried about Halloween celebrations.  We were unable to weigh the patient today for this TeleHealth visit. She feels as if she has lost 1-2 lbs since her last visit. She has lost 16-18 lbs since starting treatment with [place].  Vitamin D Deficiency Laura Brown has a diagnosis of vitamin D deficiency. She forgot to get labs as she agreed to do. She is on Vit D prescription and is at risk of over-replacement due to not getting labs. She denies nausea, vomiting or muscle weakness.  Depression with Emotional Eating Behaviors Laura Brown's mood is stable and she still struggles with emotional eating around her menses. She is using food for comfort to the extent that it is negatively impacting her health. She often snacks when she is not hungry. Laura Brown sometimes feels she is out of control and then feels guilty that she made poor food choices. She has been working on behavior modification techniques to help reduce her emotional eating and has been somewhat successful. She shows no sign of suicidal or homicidal  ideations.  Depression screen [facility] 2/9 02/24/2018 02/03/2018 12/23/2017 12/05/2017 11/20/2017  Decreased Interest 0 0 0 0 1  Down, Depressed, Hopeless 0 0 0 0 1  PHQ - 2 Score 0 0 0 0 2  Altered sleeping 0 1 0 0 0  Tired, decreased energy 0 0 0 1 2  Change in appetite 0 0 0 0 1  Feeling bad or failure about yourself  0 1 0 0 1  Trouble concentrating 0 0 0 0 0  Moving slowly or fidgety/restless 0 0 0 0 0  Suicidal thoughts 0 0 0 0 0  PHQ-9 Score 0 2 0 1 6  Difficult doing work/chores - - - - -    ASSESSMENT AND PLAN:  Vitamin D deficiency - Plan: Vitamin D, Ergocalciferol, (DRISDOL) 1.25 MG (50000 UT) CAPS capsule  Other depression - with emotional eating - Plan: buPROPion (WELLBUTRIN SR) 200 MG 12 hr tablet  Class 3 severe obesity with serious comorbidity and body mass index (BMI) of 45.0 to 49.9 in adult, unspecified obesity type (HCC)  PLAN:  Vitamin D Deficiency Laura Brown was informed that low vitamin D levels contributes to fatigue and are associated with obesity, breast, and colon cancer. Laura Brown agrees to continue taking prescription Vit D 50,000 IU every week #4 and we will refill for 1 month. We will recheck labs and she was educated on the importance of not being over-replaced. She will follow up for routine testing of vitamin D, at least 2-3 times per year. She was informed of  the risk of over-replacement of vitamin D and agrees to not increase her dose unless she discusses this with [place] first. Laura Brown agrees to follow up with our clinic in 2 to 3 weeks.  Depression with Emotional Eating Behaviors We discussed behavior modification techniques today to help Laura Brown deal with her emotional eating and depression. Laura Brown agrees to continue taking Wellbutrin SR 200 mg PO BID #60 and we will refill for 1 month. Laura Brown agrees to follow up with our clinic in 2 to 3 weeks.  Obesity Laura Brown is currently in the action stage of change. As such, her goal is to continue with weight loss  efforts She has agreed to keep a food journal with 1200 calories and 75+ grams of protein daily Avri has been instructed to work up to a goal of 150 minutes of combined cardio and strengthening exercise per week for weight loss and overall health benefits. We discussed the following Behavioral Modification Strategies today: increasing lean protein intake, decreasing simple carbohydrates , holiday eating strategies  and emotional eating strategies We discussed various medication options to help Laura Brown with her weight loss efforts and we both agreed to continue Saxenda at 1.8 mg daily.   Laura Brown has agreed to follow up with our clinic in 2 to 3 weeks. She was informed of the importance of frequent follow up visits to maximize her success with intensive lifestyle modifications for her multiple health conditions.  ALLERGIES: No Known Allergies  MEDICATIONS: Current Outpatient Medications on File Prior to Visit  Medication Sig Dispense Refill   DULoxetine (CYMBALTA) 60 MG capsule Take 60 mg by mouth 2 (two) times daily.     fexofenadine (ALLEGRA) 180 MG tablet Take 180 mg by mouth daily.     Insulin Pen Needle (BD PEN NEEDLE NANO 2ND GEN) 32G X 4 MM MISC 1 Device by Does not apply route daily. 100 each 0   Liraglutide -Weight Management (SAXENDA) 18 MG/3ML SOPN Inject 3 mg into the skin daily. 5 pen 0   verapamil (VERELAN) 100 MG 24 hr capsule Take 200 mg by mouth at bedtime.     No current facility-administered medications on file prior to visit.     PAST MEDICAL HISTORY: Past Medical History:  Diagnosis Date   Anxiety    Cluster headaches    Depression    Migraines    Osteoarthritis of both knees     PAST SURGICAL HISTORY: Past Surgical History:  Procedure Laterality Date   CESAREAN SECTION      SOCIAL HISTORY: Social History   Tobacco Use   Smoking status: Never Smoker   Smokeless tobacco: Never Used  Substance Use Topics   Alcohol use: Not on file   Drug  use: Not on file    FAMILY HISTORY: Family History  Problem Relation Age of Onset   Hyperlipidemia Mother    Hyperlipidemia Father    Hypertension Father     ROS: Review of Systems  Constitutional: Positive for weight loss.  Gastrointestinal: Negative for nausea and vomiting.  Musculoskeletal:       Negative muscle weakness  Psychiatric/Behavioral: Positive for depression. Negative for suicidal ideas.    PHYSICAL EXAM: Pt in no acute distress  RECENT LABS AND TESTS: BMET    Component Value Date/Time   NA 140 11/07/2017 0901   NA 143 03/22/2011 1014   K 4.5 11/07/2017 0901   K 3.9 03/22/2011 1014   CL 100 11/07/2017 0901   CL 107 03/22/2011 1014   CO2 26  11/07/2017 0901   CO2 26 03/22/2011 1014   GLUCOSE 68 11/07/2017 0901   GLUCOSE 77 03/22/2011 1014   BUN 10 11/07/2017 0901   BUN 14 03/22/2011 1014   CREATININE 0.66 11/07/2017 0901   CREATININE 0.73 03/22/2011 1014   CALCIUM 9.1 11/07/2017 0901   CALCIUM 8.7 03/22/2011 1014   GFRNONAA 104 11/07/2017 0901   GFRNONAA >60 03/22/2011 1014   GFRAA 120 11/07/2017 0901   GFRAA >60 03/22/2011 1014   Lab Results  Component Value Date   HGBA1C 4.9 11/07/2017   Lab Results  Component Value Date   INSULIN 6.7 11/07/2017   CBC    Component Value Date/Time   WBC 6.8 11/07/2017 0901   WBC 6.3 03/22/2011 1014   RBC 4.73 11/07/2017 0901   RBC 4.70 03/22/2011 1014   HGB 14.8 11/07/2017 0901   HCT 43.6 11/07/2017 0901   PLT 277 03/22/2011 1014   MCV 92 11/07/2017 0901   MCV 93 03/22/2011 1014   MCH 31.3 11/07/2017 0901   MCH 30.5 03/22/2011 1014   MCHC 33.9 11/07/2017 0901   MCHC 32.8 03/22/2011 1014   RDW 11.2 (L) 11/07/2017 0901   RDW 11.3 (L) 03/22/2011 1014   LYMPHSABS 2.4 11/07/2017 0901   LYMPHSABS 2.0 03/22/2011 1014   MONOABS 0.3 03/22/2011 1014   EOSABS 0.1 11/07/2017 0901   EOSABS 0.1 03/22/2011 1014   BASOSABS 0.0 11/07/2017 0901   BASOSABS 0.0 03/22/2011 1014   Iron/TIBC/Ferritin/ %Sat     Component Value Date/Time   FERRITIN 32 03/22/2011 1014   Lipid Panel     Component Value Date/Time   CHOL 208 (H) 11/07/2017 0901   TRIG 71 11/07/2017 0901   HDL 63 11/07/2017 0901   LDLCALC 131 (H) 11/07/2017 0901   Hepatic Function Panel     Component Value Date/Time   PROT 7.1 11/07/2017 0901   PROT 7.4 03/22/2011 1014   ALBUMIN 4.2 11/07/2017 0901   ALBUMIN 3.6 03/22/2011 1014   AST 13 11/07/2017 0901   AST 21 03/22/2011 1014   ALT 12 11/07/2017 0901   ALT 15 03/22/2011 1014   ALKPHOS 61 11/07/2017 0901   ALKPHOS 54 03/22/2011 1014   BILITOT 0.3 11/07/2017 0901   BILITOT 0.5 03/22/2011 1014      Component Value Date/Time   TSH 0.984 11/07/2017 0901   TSH 1.66 03/22/2011 1014      I, Burt KnackSharon Martin, am acting as Energy managertranscriptionist for Quillian Quincearen Marylan Glore, MD I have reviewed the above documentation for accuracy and completeness, and I agree with the above. -Quillian Quincearen Tramaine Sauls, MD

## 2018-11-24 ENCOUNTER — Telehealth: Payer: 59 | Admitting: Physician Assistant

## 2018-11-24 NOTE — Progress Notes (Signed)
For the accuracy of medical information and documentation you must be the actual patient logged in to their MyChart account to fill out the eVisit symptoms questionnaire.  If you are not the patient, please have the patient log in and complete the eVisit.   

## 2018-11-26 DIAGNOSIS — E559 Vitamin D deficiency, unspecified: Secondary | ICD-10-CM | POA: Diagnosis not present

## 2018-11-26 DIAGNOSIS — E7849 Other hyperlipidemia: Secondary | ICD-10-CM | POA: Diagnosis not present

## 2018-11-28 LAB — COMPREHENSIVE METABOLIC PANEL
ALT: 13 IU/L (ref 0–32)
AST: 11 IU/L (ref 0–40)
Albumin/Globulin Ratio: 1.4 (ref 1.2–2.2)
Albumin: 3.7 g/dL — ABNORMAL LOW (ref 3.8–4.8)
Alkaline Phosphatase: 61 IU/L (ref 39–117)
BUN/Creatinine Ratio: 13 (ref 9–23)
BUN: 10 mg/dL (ref 6–24)
Bilirubin Total: 0.4 mg/dL (ref 0.0–1.2)
CO2: 28 mmol/L (ref 20–29)
Calcium: 8.5 mg/dL — ABNORMAL LOW (ref 8.7–10.2)
Chloride: 103 mmol/L (ref 96–106)
Creatinine, Ser: 0.77 mg/dL (ref 0.57–1.00)
GFR calc Af Amer: 104 mL/min/{1.73_m2} (ref >59)
GFR calc non Af Amer: 90 mL/min/{1.73_m2} (ref >59)
Globulin, Total: 2.7 g/dL (ref 1.5–4.5)
Glucose: 67 mg/dL (ref 65–99)
Potassium: 4.3 mmol/L (ref 3.5–5.2)
Sodium: 140 mmol/L (ref 134–144)
Total Protein: 6.4 g/dL (ref 6.0–8.5)

## 2018-11-28 LAB — LIPID PANEL WITH LDL/HDL RATIO
Cholesterol, Total: 165 mg/dL (ref 100–199)
HDL: 62 mg/dL (ref >39)
LDL Chol Calc (NIH): 94 mg/dL (ref 0–99)
LDL/HDL Ratio: 1.5 ratio (ref 0.0–3.2)
Triglycerides: 44 mg/dL (ref 0–149)
VLDL Cholesterol Cal: 9 mg/dL (ref 5–40)

## 2018-11-28 LAB — HEMOGLOBIN A1C
Est. average glucose Bld gHb Est-mCnc: 88 mg/dL
Hgb A1c MFr Bld: 4.7 % — ABNORMAL LOW (ref 4.8–5.6)

## 2018-11-28 LAB — VITAMIN D 25 HYDROXY (VIT D DEFICIENCY, FRACTURES): Vit D, 25-Hydroxy: 35.7 ng/mL (ref 30.0–100.0)

## 2018-11-28 LAB — INSULIN, RANDOM: INSULIN: 6.1 u[IU]/mL (ref 2.6–24.9)

## 2018-12-02 MED FILL — VERAPAMIL ER PM 100 MG CAP: 100 | 90 days supply | Qty: 180 | Fill #1

## 2018-12-10 ENCOUNTER — Other Ambulatory Visit: Payer: Self-pay

## 2018-12-10 ENCOUNTER — Encounter (INDEPENDENT_AMBULATORY_CARE_PROVIDER_SITE_OTHER): Payer: Self-pay | Admitting: Family Medicine

## 2018-12-10 ENCOUNTER — Telehealth (INDEPENDENT_AMBULATORY_CARE_PROVIDER_SITE_OTHER): Payer: 59 | Admitting: Family Medicine

## 2018-12-10 DIAGNOSIS — Z6841 Body Mass Index (BMI) 40.0 and over, adult: Secondary | ICD-10-CM | POA: Diagnosis not present

## 2018-12-10 DIAGNOSIS — E559 Vitamin D deficiency, unspecified: Secondary | ICD-10-CM

## 2018-12-10 DIAGNOSIS — S8000XD Contusion of unspecified knee, subsequent encounter: Secondary | ICD-10-CM | POA: Diagnosis not present

## 2018-12-10 DIAGNOSIS — M170 Bilateral primary osteoarthritis of knee: Secondary | ICD-10-CM | POA: Diagnosis not present

## 2018-12-10 DIAGNOSIS — F3289 Other specified depressive episodes: Secondary | ICD-10-CM | POA: Diagnosis not present

## 2018-12-10 MED ORDER — VITAMIN D (ERGOCALCIFEROL) 1.25 MG (50000 UNIT) PO CAPS: 50000.0000 [IU] | ORAL_CAPSULE | ORAL | 0 refills | Status: DC

## 2018-12-10 MED ORDER — BUPROPION HCL ER (SR) 200 MG PO TB12: 200.0000 mg | ORAL_TABLET | Freq: Two times a day (BID) | ORAL | 0 refills | Status: DC

## 2018-12-10 NOTE — Progress Notes (Signed)
Office: [phone]  /  Fax: [phone] TeleHealth Visit:  Laura Brown has verbally consented to this TeleHealth visit today. The patient is located at home, the provider is located at the [facility] office. The participants in this visit include the listed provider and patient. The visit was conducted today via telephone call (FaceTime failed - changed to telephone call).  HPI:   Chief Complaint: OBESITY Laura Brown is here to discuss her progress with her obesity treatment plan. She is journaling 1200 calories and is following her eating plan approximately 75% of the time. She states she is exercising 0 minutes 0 times per week. Laura Brown feels she has lost another 3 lbs since her last visit. Hunger is controlled on Saxenda at 1.8 mg. No denies nausea or vomiting.  We were unable to weigh the patient today for this TeleHealth visit. She feels as if she has lost about 3 lbs since her last visit. She has lost 16 lbs since starting treatment with us.  Vitamin D deficiency Laura Brown has a diagnosis of Vitamin D deficiency, which is slowly improved but not yet at goal. She is currently taking prescription Vit D and denies nausea, vomiting or muscle weakness.  Depression, Other Laura Brown is struggling with emotional eating and using food for comfort to the extent that it is negatively impacting her health. She often snacks when she is not hungry. Laura Brown sometimes feels she is out of control and then feels guilty that she made poor food choices. She has been working on behavior modification techniques to help reduce her emotional eating and has been somewhat successful. Laura Brown states her mood is stable on Wellbutrin. She notes stress has decreased and she feels more in control of her emotional eating. She shows no sign of suicidal or homicidal ideations.  [facility]2018 02/03/2018 12/23/2017 12/05/2017 11/20/2017  Decreased Interest 0 0 0 0 1  Down, Depressed, Hopeless 0  0 0 0 1  PHQ - 2 Score 0 0 0 0 2  Altered sleeping 0 1 0 0 0  Tired, decreased energy 0 0 0 1 2  Change in appetite 0 0 0 0 1  Feeling bad or failure about yourself  0 1 0 0 1  Trouble concentrating 0 0 0 0 0  Moving slowly or fidgety/restless 0 0 0 0 0  Suicidal thoughts 0 0 0 0 0  PHQ-9 Score 0 2 0 1 6  Difficult doing work/chores - - - - -   ASSESSMENT AND PLAN:  Vitamin D deficiency - Plan: Vitamin D, Ergocalciferol, (DRISDOL) 1.25 MG (50000 UT) CAPS capsule  Other depression - with emotional eating - Plan: buPROPion (WELLBUTRIN SR) 200 MG 12 hr tablet  Class 3 severe obesity with serious comorbidity and body mass index (BMI) of 45.0 to 49.9 in adult, unspecified obesity type (HCC)  PLAN:  Vitamin D Deficiency Laura Brown was informed that low Vitamin D levels contributes to fatigue and are associated with obesity, breast, and colon cancer. She agrees to continue to take prescription Vit D @ 50,000 IU every week #4 with 0 refills and will follow-up for routine testing of Vitamin D, at least 2-3 times per year. She was informed of the risk of over-replacement of Vitamin D and agrees to not increase her dose unless she discusses this with us first. Laura Brown agrees to follow-up with our clinic in 3 weeks.  Depression, Other  We discussed behavior modification techniques today to help Laura Brown deal with her  emotional eating and depression. Quetzally was given a refill on her Wellbutrin 200 mg #60 with 0 refills and agrees to follow-up with our clinic in 3 weeks.  I spent > than 50% of the 25 minute visit on counseling as documented in the note.  Obesity Rechy is currently in the action stage of change. As such, her goal is to continue with weight loss efforts. She has agreed to keep a food journal with 1200 calories and 75+ grams of protein daily. Elzada has been instructed to work up to a goal of 150 minutes of combined cardio and strengthening exercise per week for weight loss and overall  health benefits. We discussed the following Behavioral Modification Strategies today: increasing lean protein intake and decreasing simple carbohydrates.  Shereta was given a refill on her Saxenda x1 month and agrees to follow-up with our clinic in 3 weeks.  Nikeshia has agreed to follow-up with our clinic in 3 weeks. She was informed of the importance of frequent follow-up visits to maximize her success with intensive lifestyle modifications for her multiple health conditions.  ALLERGIES: No Known Allergies  MEDICATIONS: Current Outpatient Medications on File Prior to Visit  Medication Sig Dispense Refill  . DULoxetine (CYMBALTA) 60 MG capsule Take 60 mg by mouth 2 (two) times daily.    . fexofenadine (ALLEGRA) 180 MG tablet Take 180 mg by mouth daily.    . Insulin Pen Needle (BD PEN NEEDLE NANO 2ND GEN) 32G X 4 MM MISC 1 Device by Does not apply route daily. 100 each 0  . Liraglutide -Weight Management (SAXENDA) 18 MG/3ML SOPN Inject 3 mg into the skin daily. 5 pen 0  . verapamil (VERELAN) 100 MG 24 hr capsule Take 200 mg by mouth at bedtime.     No current facility-administered medications on file prior to visit.     PAST MEDICAL HISTORY: Past Medical History:  Diagnosis Date  . Anxiety   . Cluster headaches   . Depression   . Migraines   . Osteoarthritis of both knees     PAST SURGICAL HISTORY: Past Surgical History:  Procedure Laterality Date  . CESAREAN SECTION      SOCIAL HISTORY: Social History   Tobacco Use  . Smoking status: Never Smoker  . Smokeless tobacco: Never Used  Substance Use Topics  . Alcohol use: Not on file  . Drug use: Not on file    FAMILY HISTORY: Family History  Problem Relation Age of Onset  . Hyperlipidemia Mother   . Hyperlipidemia Father   . Hypertension Father    ROS: Review of Systems  Gastrointestinal: Negative for nausea and vomiting.  Musculoskeletal:       Negative for muscle weakness.  Psychiatric/Behavioral: Positive for  depression. Negative for suicidal ideas.       Negative for homicidal ideas.   PHYSICAL EXAM: Pt in no acute distress  RECENT LABS AND TESTS: BMET    Component Value Date/Time   NA 140 11/26/2018 0945   NA 143 03/22/2011 1014   K 4.3 11/26/2018 0945   K 3.9 03/22/2011 1014   CL 103 11/26/2018 0945   CL 107 03/22/2011 1014   CO2 28 11/26/2018 0945   CO2 26 03/22/2011 1014   GLUCOSE 67 11/26/2018 0945   GLUCOSE 77 03/22/2011 1014   BUN 10 11/26/2018 0945   BUN 14 03/22/2011 1014   CREATININE 0.77 11/26/2018 0945   CREATININE 0.73 03/22/2011 1014   CALCIUM 8.5 (L) 11/26/2018 0945   CALCIUM 8.7  03/22/2011 1014   GFRNONAA 90 11/26/2018 0945   GFRNONAA >60 03/22/2011 1014   GFRAA 104 11/26/2018 0945   GFRAA >60 03/22/2011 1014   Lab Results  Component Value Date   HGBA1C 4.7 (L) 11/26/2018   HGBA1C 4.9 11/07/2017   Lab Results  Component Value Date   INSULIN 6.1 11/26/2018   INSULIN 6.7 11/07/2017   CBC    Component Value Date/Time   WBC 6.8 11/07/2017 0901   WBC 6.3 03/22/2011 1014   RBC 4.73 11/07/2017 0901   RBC 4.70 03/22/2011 1014   HGB 14.8 11/07/2017 0901   HCT 43.6 11/07/2017 0901   PLT 277 03/22/2011 1014   MCV 92 11/07/2017 0901   MCV 93 03/22/2011 1014   MCH 31.3 11/07/2017 0901   MCH 30.5 03/22/2011 1014   MCHC 33.9 11/07/2017 0901   MCHC 32.8 03/22/2011 1014   RDW 11.2 (L) 11/07/2017 0901   RDW 11.3 (L) 03/22/2011 1014   LYMPHSABS 2.4 11/07/2017 0901   LYMPHSABS 2.0 03/22/2011 1014   MONOABS 0.3 03/22/2011 1014   EOSABS 0.1 11/07/2017 0901   EOSABS 0.1 03/22/2011 1014   BASOSABS 0.0 11/07/2017 0901   BASOSABS 0.0 03/22/2011 1014   Iron/TIBC/Ferritin/ %Sat    Component Value Date/Time   FERRITIN 32 03/22/2011 1014   Lipid Panel     Component Value Date/Time   CHOL 165 11/26/2018 0945   TRIG 44 11/26/2018 0945   HDL 62 11/26/2018 0945   LDLCALC 94 11/26/2018 0945   Hepatic Function Panel     Component Value Date/Time   PROT 6.4  11/26/2018 0945   PROT 7.4 03/22/2011 1014   ALBUMIN 3.7 (L) 11/26/2018 0945   ALBUMIN 3.6 03/22/2011 1014   AST 11 11/26/2018 0945   AST 21 03/22/2011 1014   ALT 13 11/26/2018 0945   ALT 15 03/22/2011 1014   ALKPHOS 61 11/26/2018 0945   ALKPHOS 54 03/22/2011 1014   BILITOT 0.4 11/26/2018 0945   BILITOT 0.5 03/22/2011 1014      Component Value Date/Time   TSH 0.984 11/07/2017 0901   TSH 1.66 03/22/2011 1014   Results for DEEPTI, GUNAWAN (MRN [id number]) as of 12/10/2018 10:14  Ref. Range 11/26/2018 09:45  Vitamin D, 25-Hydroxy Latest Ref Range: 30.0 - 100.0 ng/mL 35.7   I, Michaelene Song, am acting as Location manager for Dennard Nip, MD I have reviewed the above documentation for accuracy and completeness, and I agree with the above. -Dennard Nip, MD

## 2018-12-16 ENCOUNTER — Telehealth: Payer: 59 | Admitting: Emergency Medicine

## 2018-12-16 ENCOUNTER — Other Ambulatory Visit (INDEPENDENT_AMBULATORY_CARE_PROVIDER_SITE_OTHER): Payer: Self-pay | Admitting: Family Medicine

## 2018-12-16 DIAGNOSIS — R21 Rash and other nonspecific skin eruption: Secondary | ICD-10-CM | POA: Diagnosis not present

## 2018-12-16 DIAGNOSIS — Z6841 Body Mass Index (BMI) 40.0 and over, adult: Secondary | ICD-10-CM

## 2018-12-16 MED ORDER — CLOTRIMAZOLE 1 % EX CREA: TOPICAL_CREAM | CUTANEOUS | 0 refills | Status: DC

## 2018-12-16 MED FILL — DULOXETINE HCL 60 MG CPEP: 60 | 90 days supply | Qty: 180 | Fill #1

## 2018-12-16 MED FILL — SAXENDA 18 MG/3 ML PEN: 18 | 30 days supply | Qty: 15 | Fill #0

## 2018-12-16 MED FILL — VIT D2 1.25 MG (50,000 UNIT: 1.25 MG | 28 days supply | Qty: 4 | Fill #0

## 2018-12-16 MED FILL — BUPROPION HCL SR 200 MG TAB: 200 | 30 days supply | Qty: 60 | Fill #0

## 2018-12-16 MED FILL — ANTIFUNGAL CLOTRIMAZOLE 1 %: 1 | 7 days supply | Qty: 28 | Fill #0

## 2018-12-16 NOTE — Progress Notes (Signed)
E Visit for Rash  We are sorry that you are not feeling well. Here is how we plan to help!  Your answers to the questionnaire and the picture you uploaded seem most consistent with a fungal infection such as ringworm.  This generally has a raised periphery and is quite itchy, but not painful.  I have sent a prescription for Lotrimin to your pharmacy.  This is a topical antifungal that you can apply morning and evening.  If the rash truly is fungal, it should start to rapidly improve over the next few days.    If the rash worsens, despite treatment, becomes painful, or you have fever I would advise that you be seen in-person.  GET HELP RIGHT AWAY IF:   Symptoms don't go away after treatment.  Severe itching that persists.  If you rash spreads or swells.  If you rash begins to smell.  If it blisters and opens or develops a yellow-brown crust.  You develop a fever.  You have a sore throat.  You become short of breath.  MAKE SURE YOU:  Understand these instructions. Will watch your condition. Will get help right away if you are not doing well or get worse.  Thank you for choosing an e-visit. Your e-visit answers were reviewed by a board certified advanced clinical practitioner to complete your personal care plan. Depending upon the condition, your plan could have included both over the counter or prescription medications. Please review your pharmacy choice. Be sure that the pharmacy you have chosen is open so that you can pick up your prescription now.  If there is a problem you may message your provider in [place] to have the prescription routed to another pharmacy. Your safety is important to [place]. If you have drug allergies check your prescription carefully.  For the next 24 hours, you can use MyChart to ask questions about today's visit, request a non-urgent call back, or ask for a work or school excuse from your e-visit provider. You will get an email in the next two days  asking about your experience. I hope that your e-visit has been valuable and will speed your recovery.   Greater than 5 minutes, yet less than 10 minutes was used in reviewing the patient's chart, questionnaire, prescribing medications, and documentation for this visit.

## 2018-12-23 ENCOUNTER — Encounter (INDEPENDENT_AMBULATORY_CARE_PROVIDER_SITE_OTHER): Payer: Self-pay | Admitting: Family Medicine

## 2018-12-29 ENCOUNTER — Ambulatory Visit (INDEPENDENT_AMBULATORY_CARE_PROVIDER_SITE_OTHER): Payer: 59 | Admitting: Family Medicine

## 2019-01-07 ENCOUNTER — Telehealth (INDEPENDENT_AMBULATORY_CARE_PROVIDER_SITE_OTHER): Payer: 59 | Admitting: Family Medicine

## 2019-01-07 ENCOUNTER — Encounter (INDEPENDENT_AMBULATORY_CARE_PROVIDER_SITE_OTHER): Payer: Self-pay | Admitting: Family Medicine

## 2019-01-07 ENCOUNTER — Other Ambulatory Visit: Payer: Self-pay

## 2019-01-07 DIAGNOSIS — Z6841 Body Mass Index (BMI) 40.0 and over, adult: Secondary | ICD-10-CM

## 2019-01-07 DIAGNOSIS — F3289 Other specified depressive episodes: Secondary | ICD-10-CM | POA: Diagnosis not present

## 2019-01-07 DIAGNOSIS — E559 Vitamin D deficiency, unspecified: Secondary | ICD-10-CM | POA: Diagnosis not present

## 2019-01-07 MED ORDER — BUPROPION HCL ER (SR) 200 MG PO TB12: 200.0000 mg | ORAL_TABLET | Freq: Two times a day (BID) | ORAL | 0 refills | Status: DC

## 2019-01-07 MED ORDER — VITAMIN D (ERGOCALCIFEROL) 1.25 MG (50000 UNIT) PO CAPS: 50000.0000 [IU] | ORAL_CAPSULE | ORAL | 0 refills | Status: DC

## 2019-01-07 MED FILL — VIT D2 1.25 MG (50,000 UNIT: 1.25 MG | 28 days supply | Qty: 4 | Fill #0

## 2019-01-07 NOTE — Progress Notes (Signed)
Office: [phone]  /  Fax: [phone] TeleHealth Visit:  Laura Brown has verbally consented to this TeleHealth visit today. The patient is located at work, the provider is located at the [employer] [facility] office. The participants in this visit include the listed provider and patient. The visit was conducted today via telephone call.  HPI:   Chief Complaint: OBESITY Laura Brown is here to discuss her progress with her obesity treatment plan. She is journaling 1200 calories and is following her eating plan approximately 75% of the time. She states she is exercising 0 minutes 0 times per week. Laura Brown continues to do well with weight loss and thinks she has lost 1-2 lbs even over Thanksgiving. She is tolerating Saxenda well and feels it helps decrease hunger. We were unable to weigh the patient today for this TeleHealth visit. She feels as if she has lost weight since her last visit. She has lost 16 lbs since starting treatment with [place].  Depression with emotional eating behaviors Laura Brown is struggling with emotional eating and using food for comfort to the extent that it is negatively impacting her health. She often snacks when she is not hungry. Laura Brown sometimes feels she is out of control and then feels guilty that she made poor food choices. She has been working on behavior modification techniques to help reduce her emotional eating and has been somewhat successful. Laura Brown feels her mood is improved on Wellbutrin and Cymbalta. She reports no insomnia; fatigue is improved, and she has decreased emotional eating. She shows no sign of suicidal or homicidal ideations.  Depression screen Laura Brown 2/9 02/24/2018 02/03/2018 12/23/2017 12/05/2017 11/20/2017  Decreased Interest 0 0 0 0 1  Down, Depressed, Hopeless 0 0 0 0 1  PHQ - 2 Score 0 0 0 0 2  Altered sleeping 0 1 0 0 0  Tired, decreased energy 0 0 0 1 2  Change in appetite 0 0 0 0 1  Feeling bad or failure about yourself  0 1 0 0 1  Trouble  concentrating 0 0 0 0 0  Moving slowly or fidgety/restless 0 0 0 0 0  Suicidal thoughts 0 0 0 0 0  PHQ-9 Score 0 2 0 1 6  Difficult doing work/chores - - - - -   Vitamin D deficiency Laura Brown has a diagnosis of Vitamin D deficiency, which is not yet at goal but is improving. She is currently stable on prescription Vit D and denies nausea, vomiting or muscle weakness.  ASSESSMENT AND PLAN:  Vitamin D deficiency - Plan: Vitamin D, Ergocalciferol, (DRISDOL) 1.25 MG (50000 UT) CAPS capsule  Other depression - with emotional eating - Plan: buPROPion (WELLBUTRIN SR) 200 MG 12 hr tablet  Class 3 severe obesity with serious comorbidity and body mass index (BMI) of 45.0 to 49.9 in adult, unspecified obesity type (HCC)  PLAN:  Emotional Eating Behaviors (other depression) Behavior modification techniques were discussed today to help Laura Brown deal with her emotional/non-hunger eating behaviors. Laura Brown was given a refill on her Wellbutrin 200 mg BID #60 with 0 refills and agrees to follow-up with our clinic in 2 weeks. She will continue Cymbalta.  Vitamin D Deficiency Laura Brown was informed that low Vitamin D levels contributes to fatigue and are associated with obesity, breast, and colon cancer. She agrees to continue to take prescription Vit D @ 50,000 IU every week #4 with 0 refills and will follow-up for routine testing of Vitamin D, at least 2-3 times per year. She was informed  of the risk of over-replacement of Vitamin D and agrees to not increase her dose unless she discusses this with [place] first. Laura Brown agrees to follow-up with our clinic in 2 weeks.  Obesity Laura Brown is currently in the action stage of change. As such, her goal is to continue with weight loss efforts. She has agreed to keep a food journal with 1200 calories and 75+ grams of protein daily. Laura Brown has been instructed to work up to a goal of 150 minutes of combined cardio and strengthening exercise per week for weight loss and overall  health benefits. We discussed the following Behavioral Modification Strategies today: work on meal planning and easy cooking plans, and holiday eating strategies.  Laura Brown will continue Saxenda at 1.8 mg.  Laura Brown has agreed to follow-up with our clinic in 2 weeks. She was informed of the importance of frequent follow-up visits to maximize her success with intensive lifestyle modifications for her multiple health conditions.  ALLERGIES: No Known Allergies  MEDICATIONS: Current Outpatient Medications on File Prior to Visit  Medication Sig Dispense Refill  . clotrimazole (LOTRIMIN) 1 % cream Apply to rash morning and night for one week. 15 g 0  . DULoxetine (CYMBALTA) 60 MG capsule Take 60 mg by mouth 2 (two) times daily.    . fexofenadine (ALLEGRA) 180 MG tablet Take 180 mg by mouth daily.    . Insulin Pen Needle (BD PEN NEEDLE NANO 2ND GEN) 32G X 4 MM MISC 1 Device by Does not apply route daily. 100 each 0  . SAXENDA 18 MG/3ML SOPN INJECT 3 MG INTO THE SKIN DAILY. 15 mL 0  . verapamil (VERELAN) 100 MG 24 hr capsule Take 200 mg by mouth at bedtime.     No current facility-administered medications on file prior to visit.     PAST MEDICAL HISTORY: Past Medical History:  Diagnosis Date  . Anxiety   . Cluster headaches   . Depression   . Migraines   . Osteoarthritis of both knees     PAST SURGICAL HISTORY: Past Surgical History:  Procedure Laterality Date  . CESAREAN SECTION      SOCIAL HISTORY: Social History   Tobacco Use  . Smoking status: Never Smoker  . Smokeless tobacco: Never Used  Substance Use Topics  . Alcohol use: Not on file  . Drug use: Not on file    FAMILY HISTORY: Family History  Problem Relation Age of Onset  . Hyperlipidemia Mother   . Hyperlipidemia Father   . Hypertension Father    ROS: Review of Systems  Constitutional: Positive for malaise/fatigue (improved).  Gastrointestinal: Negative for nausea and vomiting.  Musculoskeletal:        Negative for muscle weakness.  Psychiatric/Behavioral: Positive for depression. Negative for suicidal ideas. The patient does not have insomnia.        Negative for homicidal ideas.   PHYSICAL EXAM: Pt in no acute distress  RECENT LABS AND TESTS: BMET    Component Value Date/Time   NA 140 11/26/2018 0945   NA 143 03/22/2011 1014   K 4.3 11/26/2018 0945   K 3.9 03/22/2011 1014   CL 103 11/26/2018 0945   CL 107 03/22/2011 1014   CO2 28 11/26/2018 0945   CO2 26 03/22/2011 1014   GLUCOSE 67 11/26/2018 0945   GLUCOSE 77 03/22/2011 1014   BUN 10 11/26/2018 0945   BUN 14 03/22/2011 1014   CREATININE 0.77 11/26/2018 0945   CREATININE 0.73 03/22/2011 1014   CALCIUM 8.5 (L)  11/26/2018 0945   CALCIUM 8.7 03/22/2011 1014   GFRNONAA 90 11/26/2018 0945   GFRNONAA >60 03/22/2011 1014   GFRAA 104 11/26/2018 0945   GFRAA >60 03/22/2011 1014   Lab Results  Component Value Date   HGBA1C 4.7 (L) 11/26/2018   HGBA1C 4.9 11/07/2017   Lab Results  Component Value Date   INSULIN 6.1 11/26/2018   INSULIN 6.7 11/07/2017   CBC    Component Value Date/Time   WBC 6.8 11/07/2017 0901   WBC 6.3 03/22/2011 1014   RBC 4.73 11/07/2017 0901   RBC 4.70 03/22/2011 1014   HGB 14.8 11/07/2017 0901   HCT 43.6 11/07/2017 0901   PLT 277 03/22/2011 1014   MCV 92 11/07/2017 0901   MCV 93 03/22/2011 1014   MCH 31.3 11/07/2017 0901   MCH 30.5 03/22/2011 1014   MCHC 33.9 11/07/2017 0901   MCHC 32.8 03/22/2011 1014   RDW 11.2 (L) 11/07/2017 0901   RDW 11.3 (L) 03/22/2011 1014   LYMPHSABS 2.4 11/07/2017 0901   LYMPHSABS 2.0 03/22/2011 1014   MONOABS 0.3 03/22/2011 1014   EOSABS 0.1 11/07/2017 0901   EOSABS 0.1 03/22/2011 1014   BASOSABS 0.0 11/07/2017 0901   BASOSABS 0.0 03/22/2011 1014   Iron/TIBC/Ferritin/ %Sat    Component Value Date/Time   FERRITIN 32 03/22/2011 1014   Lipid Panel     Component Value Date/Time   CHOL 165 11/26/2018 0945   TRIG 44 11/26/2018 0945   HDL 62 11/26/2018  0945   LDLCALC 94 11/26/2018 0945   Hepatic Function Panel     Component Value Date/Time   PROT 6.4 11/26/2018 0945   PROT 7.4 03/22/2011 1014   ALBUMIN 3.7 (L) 11/26/2018 0945   ALBUMIN 3.6 03/22/2011 1014   AST 11 11/26/2018 0945   AST 21 03/22/2011 1014   ALT 13 11/26/2018 0945   ALT 15 03/22/2011 1014   ALKPHOS 61 11/26/2018 0945   ALKPHOS 54 03/22/2011 1014   BILITOT 0.4 11/26/2018 0945   BILITOT 0.5 03/22/2011 1014      Component Value Date/Time   TSH 0.984 11/07/2017 0901   TSH 1.66 03/22/2011 1014   Results for Lilli FewSENTER, Dajahnae H (MRN [id number]) as of 01/07/2019 14:44  Ref. Range 11/26/2018 09:45  Vitamin D, 25-Hydroxy Latest Ref Range: 30.0 - 100.0 ng/mL 35.7   I, Marianna Paymentenise Haag, am acting as Energy managertranscriptionist for Quillian Quincearen Kalista Laguardia, MD I have reviewed the above documentation for accuracy and completeness, and I agree with the above. -Quillian Quincearen [name] Domke, MD

## 2019-01-09 MED FILL — BUPROPION HCL SR 200 MG TAB: 200 | 30 days supply | Qty: 60 | Fill #0

## 2019-01-14 MED FILL — VIT D2 1.25 MG (50,000 UNIT: 1.25 MG | 28 days supply | Qty: 4 | Fill #0

## 2019-01-16 ENCOUNTER — Encounter (INDEPENDENT_AMBULATORY_CARE_PROVIDER_SITE_OTHER): Payer: Self-pay | Admitting: Family Medicine

## 2019-01-20 MED FILL — VIT D2 1.25 MG (50,000 UNIT: 1.25 MG | 28 days supply | Qty: 4 | Fill #0

## 2019-01-20 MED FILL — BUPROPION HCL SR 200 MG TAB: 200 | 30 days supply | Qty: 60 | Fill #0

## 2019-01-21 ENCOUNTER — Telehealth (INDEPENDENT_AMBULATORY_CARE_PROVIDER_SITE_OTHER): Payer: 59 | Admitting: Family Medicine

## 2019-01-29 ENCOUNTER — Other Ambulatory Visit: Payer: 59

## 2019-02-01 ENCOUNTER — Telehealth: Payer: 59 | Admitting: Physician Assistant

## 2019-02-01 DIAGNOSIS — U071 COVID-19: Secondary | ICD-10-CM

## 2019-02-01 DIAGNOSIS — H9209 Otalgia, unspecified ear: Secondary | ICD-10-CM

## 2019-02-01 NOTE — Progress Notes (Signed)
E-Visit for Corona Virus Screening  The ear discomfort is likely secondary to fluid build up behind the ears from COVID. Continue keeping hydrated and getting plenty of rest. Make sure to start a nasal steroid like Flonase OTC once daily along with an antihistamine (Claritin, Zyrtec, Allegra, Xyzal, etc) to help reduce pressure and allow fluid to clear up.   Testing Information: The COVID-19 Community Testing sites will begin testing BY APPOINTMENT ONLY.  You can schedule online at [email or web address]  If you do not have access to a smart phone or computer you may call [phone] for an appointment.  Testing Locations: Appointment schedule is 8 am to 3:30 pm at all sites  [facility] indoors at [address] [place] [address] [facility]  indoors at [address], [place], [place] [address] [facility] indoors at [address], [place] [address]  Additional testing sites in the Community:  . For [facility] Testing sites in [facility]  FarmerBuys.com.[email or web address]  . For Pop-up testing sites in [place]  [email or web address]  . For Testing sites with regular hours [email or web address]/[email or web address]  . For [facility] [email or web address]  . For [facility] [email or web address].dk  . For [facility] testing in [place] and [employer] [email or web address].dk  . For [facility] testing in [facility]   [email or web address]  For  more information about community testing call [phone]   We are enrolling you in our MyChart Home Monitoring for COVID19 . Daily you will receive a questionnaire  within the MyChart website. Our COVID 19 response team will be monitoring your responses daily.  Please quarantine yourself while awaiting your test results. If you develop fever/cough/breathlessness, please stay home for 10 days with improving symptoms and until you have had 24 hours of no fever (without taking a fever reducer).  You should wear a mask or cloth face covering over your nose and mouth if you must be around other people or animals, including pets (even at home). Try to stay at least 6 feet away from other people. This will protect the people around you.  Please continue good preventive care measures, including:  frequent hand-washing, avoid touching your face, cover coughs/sneezes, stay out of crowds and keep a 6 foot distance from others.  COVID-19 is a respiratory illness with symptoms that are similar to the flu. Symptoms are typically mild to moderate, but there have been cases of severe illness and death due to the virus.   The following symptoms may appear 2-14 days after exposure: . Fever . Cough . Shortness of breath or difficulty breathing . Chills . Repeated shaking with chills . Muscle pain . Headache . Sore throat . New loss of taste or smell . Fatigue . Congestion or runny nose . Nausea or vomiting . Diarrhea  Go to the nearest hospital ED for assessment if fever/cough/breathlessness are severe or illness seems like a threat to life.  It is vitally important that if you feel that you have an infection such as this virus or any other virus that you stay home and away from places where you may spread it to others.  You should avoid contact with people age 43 and older.    You may also take acetaminophen (Tylenol) as needed for fever.  Reduce your risk of any infection by using the same precautions used for avoiding the common cold or flu:  [name] Kitchen Wash your hands often with soap and warm water for at least 20 seconds.  If  soap and water are not readily available, use an  alcohol-based hand sanitizer with at least 60% alcohol.  . If coughing or sneezing, cover your mouth and nose by coughing or sneezing into the elbow areas of your shirt or coat, into a tissue or into your sleeve (not your hands). . Avoid shaking hands with others and consider head nods or verbal greetings only. . Avoid touching your eyes, nose, or mouth with unwashed hands.  . Avoid close contact with people who are sick. . Avoid places or events with large numbers of people in one location, like concerts or sporting events. . Carefully consider travel plans you have or are making. . If you are planning any travel outside or inside the [place], visit the CDC's Travelers' Health webpage for the latest health notices. . If you have some symptoms but not all symptoms, continue to monitor at home and seek medical attention if your symptoms worsen. . If you are having a medical emergency, call 911.  HOME CARE . Only take medications as instructed by your medical team. . Drink plenty of fluids and get plenty of rest. . A steam or ultrasonic humidifier can help if you have congestion.   GET HELP RIGHT AWAY IF YOU HAVE EMERGENCY WARNING SIGNS** FOR COVID-19. If you or someone is showing any of these signs seek emergency medical care immediately. Call 911 or proceed to your closest emergency facility if: . You develop worsening high fever. . Trouble breathing . Bluish lips or face . Persistent pain or pressure in the chest . New confusion . Inability to wake or stay awake . You cough up blood. . Your symptoms become more severe  **This list is not all possible symptoms. Contact your medical provider for any symptoms that are sever or concerning to you.  MAKE SURE YOU   Understand these instructions.  Will watch your condition.  Will get help right away if you are not doing well or get worse.  Your e-visit answers were reviewed by a board certified advanced clinical practitioner to complete your  personal care plan.  Depending on the condition, your plan could have included both over the counter or prescription medications.  If there is a problem please reply once you have received a response from your provider.  Your safety is important to [place].  If you have drug allergies check your prescription carefully.    You can use MyChart to ask questions about today's visit, request a non-urgent call back, or ask for a work or school excuse for 24 hours related to this e-Visit. If it has been greater than 24 hours you will need to follow up with your provider, or enter a new e-Visit to address those concerns. You will get an e-mail in the next two days asking about your experience.  I hope that your e-visit has been valuable and will speed your recovery. Thank you for using e-visits.

## 2019-02-01 NOTE — Progress Notes (Signed)
I have spent 5 minutes in review of e-visit questionnaire, review and updating patient chart, medical decision making and response to patient.   Spiro Ausborn Cody Zainab Crumrine, PA-C    

## 2019-02-03 MED FILL — AZELASTINE HCL 137 MCG SPRY: 0.1 | 25 days supply | Qty: 30 | Fill #0

## 2019-02-04 ENCOUNTER — Other Ambulatory Visit: Payer: Self-pay | Admitting: Nurse Practitioner

## 2019-02-04 DIAGNOSIS — Z6841 Body Mass Index (BMI) 40.0 and over, adult: Secondary | ICD-10-CM

## 2019-02-04 DIAGNOSIS — U071 COVID-19: Secondary | ICD-10-CM

## 2019-02-04 NOTE — Progress Notes (Signed)
  I connected by phone with Laura Brown on 02/04/2019 at 9:17 AM to discuss the potential use of an new treatment for mild to moderate COVID-19 viral infection in non-hospitalized patients.  This patient is a 49 y.o. female that meets the FDA criteria for Emergency Use Authorization of bamlanivimab or casirivimab\imdevimab.  Has a (+) direct SARS-CoV-2 viral test result  Has mild or moderate COVID-19   Is ? 49 years of age and weighs ? 40 kg  Is NOT hospitalized due to COVID-19  Is NOT requiring oxygen therapy or requiring an increase in baseline oxygen flow rate due to COVID-19  Is within 10 days of symptom onset  Has at least one of the high risk factor(s) for progression to severe COVID-19 and/or hospitalization as defined in EUA.  Specific high risk criteria : BMI >/= 35   I have spoken and communicated the following to the patient or parent/caregiver:  1. FDA has authorized the emergency use of bamlanivimab and casirivimab\imdevimab for the treatment of mild to moderate COVID-19 in adults and pediatric patients with positive results of direct SARS-CoV-2 viral testing who are 4 years of age and older weighing at least 40 kg, and who are at high risk for progressing to severe COVID-19 and/or hospitalization.  2. The significant known and potential risks and benefits of bamlanivimab and casirivimab\imdevimab, and the extent to which such potential risks and benefits are unknown.  3. Information on available alternative treatments and the risks and benefits of those alternatives, including clinical trials.  4. Patients treated with bamlanivimab and casirivimab\imdevimab should continue to self-isolate and use infection control measures (e.g., wear mask, isolate, social distance, avoid sharing personal items, clean and disinfect "high touch" surfaces, and frequent handwashing) according to CDC guidelines.   5. The patient or parent/caregiver has the option to accept or refuse  bamlanivimab or casirivimab\imdevimab .  After reviewing this information with the patient, The patient agreed to proceed with receiving the bamlanimivab infusion and will be provided a copy of the Fact sheet prior to receiving the infusion.Ivonne Andrew 02/04/2019 9:17 AM

## 2019-02-06 ENCOUNTER — Ambulatory Visit (HOSPITAL_COMMUNITY)
Admission: RE | Admit: 2019-02-06 | Discharge: 2019-02-06 | Disposition: A | Discharge status: Home / Self Care | Payer: HRSA Program | Source: Ambulatory Visit | Attending: Pulmonary Disease | Admitting: Pulmonary Disease

## 2019-02-06 DIAGNOSIS — Z6841 Body Mass Index (BMI) 40.0 and over, adult: Secondary | ICD-10-CM | POA: Diagnosis present

## 2019-02-06 DIAGNOSIS — U071 COVID-19: Secondary | ICD-10-CM | POA: Insufficient documentation

## 2019-02-06 MED ORDER — DIPHENHYDRAMINE HCL 50 MG/ML IJ SOLN: 50.0000 mg | Freq: Once | INTRAMUSCULAR | Status: DC | PRN

## 2019-02-06 MED ORDER — SODIUM CHLORIDE 0.9 % IV SOLN
700.0000 mg | Freq: Once | INTRAVENOUS | Status: AC
  Administered 2019-02-06: 700 mg via INTRAVENOUS
  Filled 2019-02-06: qty 20

## 2019-02-06 MED ORDER — FAMOTIDINE IN NACL 20-0.9 MG/50ML-% IV SOLN: 20.0000 mg | Freq: Once | INTRAVENOUS | Status: DC | PRN

## 2019-02-06 MED ORDER — ALBUTEROL SULFATE HFA 108 (90 BASE) MCG/ACT IN AERS: 2.0000 | INHALATION_SPRAY | Freq: Once | RESPIRATORY_TRACT | Status: DC | PRN

## 2019-02-06 MED ORDER — EPINEPHRINE 0.3 MG/0.3ML IJ SOAJ: 0.3000 mg | Freq: Once | INTRAMUSCULAR | Status: DC | PRN

## 2019-02-06 MED ORDER — SODIUM CHLORIDE 0.9 % IV SOLN
INTRAVENOUS | Status: DC | PRN
  Administered 2019-02-06: 250 mL via INTRAVENOUS

## 2019-02-06 MED ORDER — METHYLPREDNISOLONE SODIUM SUCC 125 MG IJ SOLR: 125.0000 mg | Freq: Once | INTRAMUSCULAR | Status: DC | PRN

## 2019-02-06 NOTE — Progress Notes (Signed)
Patient ID: Laura Brown, female   DOB: [birth year], 49 y.o.   MRN: [id number]  Diagnosis: COVID-19  Physician:  Procedure: [facility] Infusion [facility] Med: bamlanivimab infusion - Provided patient with bamlanimivab fact sheet for patients, parents and caregivers prior to infusion.  Complications: No immediate complications noted.  Discharge: Discharged home   Shaune Spittle 02/06/2019

## 2019-02-06 NOTE — Discharge Instructions (Signed)

## 2019-02-12 MED FILL — AZELASTINE HCL 137 MCG SPRY: 0.1 | 25 days supply | Qty: 30 | Fill #0

## 2019-02-17 ENCOUNTER — Other Ambulatory Visit (INDEPENDENT_AMBULATORY_CARE_PROVIDER_SITE_OTHER): Payer: Self-pay | Admitting: Family Medicine

## 2019-02-17 DIAGNOSIS — E559 Vitamin D deficiency, unspecified: Secondary | ICD-10-CM

## 2019-02-18 ENCOUNTER — Encounter (INDEPENDENT_AMBULATORY_CARE_PROVIDER_SITE_OTHER): Payer: Self-pay | Admitting: Family Medicine

## 2019-02-18 ENCOUNTER — Telehealth (INDEPENDENT_AMBULATORY_CARE_PROVIDER_SITE_OTHER): Payer: No Typology Code available for payment source | Admitting: Family Medicine

## 2019-02-18 ENCOUNTER — Other Ambulatory Visit: Payer: Self-pay

## 2019-02-18 DIAGNOSIS — E559 Vitamin D deficiency, unspecified: Secondary | ICD-10-CM

## 2019-02-18 DIAGNOSIS — F4321 Adjustment disorder with depressed mood: Secondary | ICD-10-CM | POA: Diagnosis not present

## 2019-02-18 DIAGNOSIS — Z6841 Body Mass Index (BMI) 40.0 and over, adult: Secondary | ICD-10-CM

## 2019-02-18 MED ORDER — VITAMIN D (ERGOCALCIFEROL) 1.25 MG (50000 UNIT) PO CAPS: 50000.0000 [IU] | ORAL_CAPSULE | ORAL | 0 refills | Status: DC

## 2019-02-18 MED ORDER — SAXENDA 18 MG/3ML ~~LOC~~ SOPN: 3.0000 mg | PEN_INJECTOR | Freq: Every day | SUBCUTANEOUS | 0 refills | Status: DC

## 2019-02-18 MED FILL — VIT D2 1.25 MG (50,000 UNIT: 1.25 MG | 28 days supply | Qty: 4 | Fill #0

## 2019-02-18 MED FILL — SAXENDA 18 MG/3 ML PEN: 18 | 30 days supply | Qty: 15 | Fill #0

## 2019-02-19 NOTE — Progress Notes (Signed)
TeleHealth Visit:  Due to the COVID-19 pandemic, this visit was completed with telemedicine (audio/video) technology to reduce patient and provider exposure as well as to preserve personal protective equipment.   Laura Brown has verbally consented to this TeleHealth visit. The patient is located at home, the provider is located at the [facility] office. The participants in this visit include the listed provider and patient. Ashlin was unable to use realtime audiovisual technology today and the telehealth visit was conducted via telephone.   Chief Complaint: OBESITY Laura Brown is here to discuss her progress with her obesity treatment plan along with follow-up of her obesity related diagnoses. Laura Brown is on keeping a food journal and adhering to recommended goals of 1200 calories and 75+ grams of protein daily and states she is following her eating plan approximately 50% of the time. Laura Brown states she is doing 0 minutes 0 times per week.  Today's visit was #: 23 Starting weight: 296 lbs Starting date: 11/07/17  Interim History: Laura Brown's last visit was 6 weeks ago. In the meanwhile she and her father had COVID19, and her father died approximately 1 week later. She has recovered and got the monoclonal alex treatment. She was off her [place] while sick, but she has restarted it and is doing well.  Subjective:   1. Vitamin D deficiency Laura Brown is stable on Vit D, her Vit D level is improving but not yet at goal. I discussed labs with the patient today.  2. Grief reaction Laura Brown's father died approximately 2 weeks ago, and she is grieving. Her appetite has decreased but it is starting to come back. She is taking melatonin to help keep her sleep cycle normal. She appears to be dealing with her grief in a healthy way. She denies suicidal ideas.  Assessment/Plan:   1. Vitamin D deficiency Low Vitamin D level contributes to fatigue and are associated with obesity, breast, and colon  cancer. We will refill prescription Vit D for 1 month. We will recheck labs in 1-2 months. Kataleya will follow-up for routine testing of Vitamin D, at least 2-3 times per year to avoid over-replacement. We will continue to monitor.  - Vitamin D, Ergocalciferol, (DRISDOL) 1.25 MG (50000 UNIT) CAPS capsule; Take 1 capsule (50,000 Units total) by mouth every 7 (seven) days.  Dispense: 4 capsule; Refill: 0  2. Grief reaction Laura Brown was encouraged to continue to take care of her health. She has a history of depression, so we will continue to monitor closely. Support and encouragement was offered.  3. Class 3 severe obesity with serious comorbidity and body mass index (BMI) of 45.0 to 49.9 in adult, unspecified obesity type [facility]) We discussed various medication options to help Laura Brown with her weight loss efforts and we both agreed to continue Saxenda and we will refill for 1 month.  - Liraglutide -Weight Management (SAXENDA) 18 MG/3ML SOPN; Inject 3 mg into the skin daily.  Dispense: 5 pen; Refill: 0  Laura Brown is currently in the action stage of change. As such, her goal is to continue with weight loss efforts. She has agreed to keeping a food journal and adhering to recommended goals of 1200 calories and 70+ grams of protein daily.   Behavioral modification strategies: increasing lean protein intake, no skipping meals and emotional eating strategies.  Laura Brown has agreed to follow-up with our clinic in 3 weeks. She was informed of the importance of frequent follow-up visits to maximize her success with intensive lifestyle modifications for her multiple health  conditions.  Objective:   VITALS: Per patient if applicable, see vitals. GENERAL: Alert and in no acute distress. CARDIOPULMONARY: No increased WOB. Speaking in clear sentences.  PSYCH: Pleasant and cooperative. Speech normal rate and rhythm. Affect is appropriate. Insight and judgement are appropriate. Attention is focused, linear, and  appropriate.  NEURO: Oriented as arrived to appointment on time with no prompting.   Lab Results  Component Value Date   CREATININE 0.77 11/26/2018   BUN 10 11/26/2018   NA 140 11/26/2018   K 4.3 11/26/2018   CL 103 11/26/2018   CO2 28 11/26/2018   Lab Results  Component Value Date   ALT 13 11/26/2018   AST 11 11/26/2018   ALKPHOS 61 11/26/2018   BILITOT 0.4 11/26/2018   Lab Results  Component Value Date   HGBA1C 4.7 (L) 11/26/2018   HGBA1C 4.9 11/07/2017   Lab Results  Component Value Date   INSULIN 6.1 11/26/2018   INSULIN 6.7 11/07/2017   Lab Results  Component Value Date   TSH 0.984 11/07/2017   Lab Results  Component Value Date   CHOL 165 11/26/2018   HDL 62 11/26/2018   LDLCALC 94 11/26/2018   TRIG 44 11/26/2018   Lab Results  Component Value Date   WBC 6.8 11/07/2017   HGB 14.8 11/07/2017   HCT 43.6 11/07/2017   MCV 92 11/07/2017   PLT 277 03/22/2011   Lab Results  Component Value Date   FERRITIN 32 03/22/2011    Attestation Statements:   Reviewed by clinician on day of visit: allergies, medications, problem list, medical history, surgical history, family history, social history, and previous encounter notes.  Time spent on visit including pre-visit chart review and post-visit care was 30 minutes.   I, Burt Knack, am acting as transcriptionist for Quillian Quince, MD.  I have reviewed the above documentation for accuracy and completeness, and I agree with the above. - Quillian Quince, MD

## 2019-03-11 ENCOUNTER — Encounter (INDEPENDENT_AMBULATORY_CARE_PROVIDER_SITE_OTHER): Payer: Self-pay | Admitting: Family Medicine

## 2019-03-11 ENCOUNTER — Telehealth (INDEPENDENT_AMBULATORY_CARE_PROVIDER_SITE_OTHER): Payer: No Typology Code available for payment source | Admitting: Family Medicine

## 2019-03-11 ENCOUNTER — Other Ambulatory Visit: Payer: Self-pay

## 2019-03-11 DIAGNOSIS — E8881 Metabolic syndrome: Secondary | ICD-10-CM

## 2019-03-11 DIAGNOSIS — E559 Vitamin D deficiency, unspecified: Secondary | ICD-10-CM

## 2019-03-11 DIAGNOSIS — Z6841 Body Mass Index (BMI) 40.0 and over, adult: Secondary | ICD-10-CM | POA: Diagnosis not present

## 2019-03-11 MED ORDER — VITAMIN D (ERGOCALCIFEROL) 1.25 MG (50000 UNIT) PO CAPS: 50000.0000 [IU] | ORAL_CAPSULE | ORAL | 0 refills | Status: DC

## 2019-03-11 MED FILL — VERAPAMIL ER PM 100 MG CAP: 100 | 60 days supply | Qty: 120 | Fill #2

## 2019-03-11 NOTE — Progress Notes (Signed)
TeleHealth Visit:  Due to the COVID-19 pandemic, this visit was completed with telemedicine (audio/video) technology to reduce patient and provider exposure as well as to preserve personal protective equipment.   Laura Brown has verbally consented to this TeleHealth visit. The patient is located at home, the provider is located at the [facility] office. The participants in this visit include the listed provider and patient. Laura Brown was unable to use realtime audiovisual technology today and the telehealth visit was conducted via telephone.   Chief Complaint: OBESITY Laura Brown is here to discuss her progress with her obesity treatment plan along with follow-up of her obesity related diagnoses. Laura Brown is on keeping a food journal and adhering to recommended goals of 1200 calories and 70+ grams of protein daily and states she is following her eating plan approximately 80% of the time. Laura Brown states she is doing 0 minutes 0 times per week.  Today's visit was #: 24 Starting weight: 296 lbs Starting date: 11/07/17  Interim History: Laura Brown has been doing better with her eating plan, and she feels she has lost approximately 5 lbs since her last visit. She notes her weight at home was 265 lbs on 03/09/2019. She states her hunger is controlled and she is tolerating Saxenda well.  Subjective:   1. Vitamin D deficiency Laura Brown is stable on Vit D, and she is due for labs soon.  2. Insulin resistance Laura Brown is doing well with her diet prescription. Her last A1c was on the low end. She is tolerating Saxenda well overall.  Assessment/Plan:   1. Vitamin D deficiency Low Vitamin D level contributes to fatigue and are associated with obesity, breast, and colon cancer. We will refill prescription Vitamin D for 1 month. Laura Brown will follow-up for routine testing of Vitamin D, at least 2-3 times per year to avoid over-replacement. We will check labs today.  - VITAMIN D 25 Hydroxy (Vit-D Deficiency,  Fractures) - Vitamin D, Ergocalciferol, (DRISDOL) 1.25 MG (50000 UNIT) CAPS capsule; Take 1 capsule (50,000 Units total) by mouth every 7 (seven) days.  Dispense: 4 capsule; Refill: 0  2. Insulin resistance Laura Brown will continue to work on weight loss, diet, exercise, and decreasing simple carbohydrates to help decrease the risk of diabetes. We will check labs today. Laura Brown agreed to follow-up with [place] as directed to closely monitor her progress.  - Comprehensive metabolic panel - Hemoglobin A1c - Insulin, random  3. Class 3 severe obesity with serious comorbidity and body mass index (BMI) of 45.0 to 49.9 in adult, unspecified obesity type (HCC) Laura Brown is currently in the action stage of change. As such, her goal is to continue with weight loss efforts. She has agreed to keeping a food journal and adhering to recommended goals of 1200 calories and 75+ grams of protein daily.   We discussed various medication options to help Larue with her weight loss efforts and we both agreed to continue Saxenda as is.  Exercise goals: Laura Brown is to try to increase walking with improving weather.  Behavioral modification strategies: increasing vegetables.  Laura Brown has agreed to follow-up with our clinic in 3 weeks with myself. She was informed of the importance of frequent follow-up visits to maximize her success with intensive lifestyle modifications for her multiple health conditions.  Laura Brown was informed we would discuss her lab results at her next visit unless there is a critical issue that needs to be addressed sooner. Laura Brown agreed to keep her next visit at the agreed upon time to discuss  these results.  Objective:   VITALS: Per patient if applicable, see vitals. GENERAL: Alert and in no acute distress. CARDIOPULMONARY: No increased WOB. Speaking in clear sentences.  PSYCH: Pleasant and cooperative. Speech normal rate and rhythm. Affect is appropriate. Insight and judgement are appropriate. Attention  is focused, linear, and appropriate.  NEURO: Oriented as arrived to appointment on time with no prompting.   Lab Results  Component Value Date   CREATININE 0.77 11/26/2018   BUN 10 11/26/2018   NA 140 11/26/2018   K 4.3 11/26/2018   CL 103 11/26/2018   CO2 28 11/26/2018   Lab Results  Component Value Date   ALT 13 11/26/2018   AST 11 11/26/2018   ALKPHOS 61 11/26/2018   BILITOT 0.4 11/26/2018   Lab Results  Component Value Date   HGBA1C 4.7 (L) 11/26/2018   HGBA1C 4.9 11/07/2017   Lab Results  Component Value Date   INSULIN 6.1 11/26/2018   INSULIN 6.7 11/07/2017   Lab Results  Component Value Date   TSH 0.984 11/07/2017   Lab Results  Component Value Date   CHOL 165 11/26/2018   HDL 62 11/26/2018   LDLCALC 94 11/26/2018   TRIG 44 11/26/2018   Lab Results  Component Value Date   WBC 6.8 11/07/2017   HGB 14.8 11/07/2017   HCT 43.6 11/07/2017   MCV 92 11/07/2017   PLT 277 03/22/2011   Lab Results  Component Value Date   FERRITIN 32 03/22/2011    Attestation Statements:   Reviewed by clinician on day of visit: allergies, medications, problem list, medical history, surgical history, family history, social history, and previous encounter notes.  Time spent on visit including pre-visit chart review and post-visit care was 31 minutes.    I, Trixie Dredge, am acting as transcriptionist for Dennard Nip, MD.  I have reviewed the above documentation for accuracy and completeness, and I agree with the above. - Dennard Nip, MD

## 2019-03-12 MED FILL — DULoxetine HCL 60 MG CPEP: 60 | 90 days supply | Qty: 180 | Fill #2

## 2019-03-12 MED FILL — VIT D2 1.25 MG (50,000 UNIT: 1.25 MG | 28 days supply | Qty: 4 | Fill #0

## 2019-03-26 LAB — COMPREHENSIVE METABOLIC PANEL
ALT: 13 IU/L (ref 0–32)
AST: 12 IU/L (ref 0–40)
Albumin/Globulin Ratio: 1.6 (ref 1.2–2.2)
Albumin: 3.9 g/dL (ref 3.8–4.8)
Alkaline Phosphatase: 55 IU/L (ref 39–117)
BUN/Creatinine Ratio: 16 (ref 9–23)
BUN: 12 mg/dL (ref 6–24)
Bilirubin Total: 0.5 mg/dL (ref 0.0–1.2)
CO2: 25 mmol/L (ref 20–29)
Calcium: 9.2 mg/dL (ref 8.7–10.2)
Chloride: 105 mmol/L (ref 96–106)
Creatinine, Ser: 0.76 mg/dL (ref 0.57–1.00)
GFR calc Af Amer: 106 mL/min/{1.73_m2} (ref >59)
GFR calc non Af Amer: 92 mL/min/{1.73_m2} (ref >59)
Globulin, Total: 2.4 g/dL (ref 1.5–4.5)
Glucose: 78 mg/dL (ref 65–99)
Potassium: 4.6 mmol/L (ref 3.5–5.2)
Sodium: 144 mmol/L (ref 134–144)
Total Protein: 6.3 g/dL (ref 6.0–8.5)

## 2019-03-26 LAB — HEMOGLOBIN A1C
Est. average glucose Bld gHb Est-mCnc: 85 mg/dL
Hgb A1c MFr Bld: 4.6 % — ABNORMAL LOW (ref 4.8–5.6)

## 2019-03-26 LAB — INSULIN, RANDOM: INSULIN: 9.7 u[IU]/mL (ref 2.6–24.9)

## 2019-03-26 LAB — VITAMIN D 25 HYDROXY (VIT D DEFICIENCY, FRACTURES): Vit D, 25-Hydroxy: 44.6 ng/mL (ref 30.0–100.0)

## 2019-04-01 ENCOUNTER — Telehealth (INDEPENDENT_AMBULATORY_CARE_PROVIDER_SITE_OTHER): Payer: No Typology Code available for payment source | Admitting: Family Medicine

## 2019-04-01 ENCOUNTER — Other Ambulatory Visit: Payer: Self-pay

## 2019-04-01 DIAGNOSIS — F3289 Other specified depressive episodes: Secondary | ICD-10-CM

## 2019-04-01 DIAGNOSIS — E8881 Metabolic syndrome: Secondary | ICD-10-CM

## 2019-04-01 DIAGNOSIS — Z6841 Body Mass Index (BMI) 40.0 and over, adult: Secondary | ICD-10-CM

## 2019-04-01 DIAGNOSIS — E559 Vitamin D deficiency, unspecified: Secondary | ICD-10-CM

## 2019-04-01 MED ORDER — VITAMIN D (ERGOCALCIFEROL) 1.25 MG (50000 UNIT) PO CAPS: 50000.0000 [IU] | ORAL_CAPSULE | ORAL | 0 refills | Status: DC

## 2019-04-01 MED ORDER — BUPROPION HCL ER (SR) 200 MG PO TB12: 200.0000 mg | ORAL_TABLET | Freq: Two times a day (BID) | ORAL | 0 refills | Status: DC

## 2019-04-01 MED FILL — BUPROPION HCL SR 200 MG TAB: 200 | 30 days supply | Qty: 60 | Fill #0

## 2019-04-01 NOTE — Progress Notes (Signed)
TeleHealth Visit:  Due to the COVID-19 pandemic, this visit was completed with telemedicine (audio/video) technology to reduce patient and provider exposure as well as to preserve personal protective equipment.   Meya has verbally consented to this TeleHealth visit. The patient is located at home, the provider is located at the [facility] office. The participants in this visit include the listed provider and patient. Theresa was unable to use realtime audiovisual technology today and the telehealth visit was conducted via telephone.   Chief Complaint: OBESITY Shaima is here to discuss her progress with her obesity treatment plan along with follow-up of her obesity related diagnoses. Aeliana is on keeping a food journal and adhering to recommended goals of 1200 calories and 75+ grams of protein daily and states she is following her eating plan approximately 85% of the time. Aviana states she is walking for 15-20 minutes 2 times per week.  Today's visit was #: 25 Starting weight: 296 lbs Starting date: 11/07/2017  Interim History: Nikole continues to do well with weight loss. She is journaling most days. Her hunger is mostly controlled, but she sometimes struggles with night time cravings and tries to go to bed earlier to avoid snacking. She states her weight was 259 lbs today.   Subjective:   1. Other depression, with emotional eating Katherin is doing well on Wellbutrin, and she notes it makes a difference when she takes her second dose. She denies insomnia.  2. Insulin resistance Jiyah's A1c, glucose, and insulin are all well controlled with diet. She denies feeling hypoglycemic. I discussed labs with the patient today.  3. Vitamin D deficiency Catalina's Vit D level is slowing improving, and is almost at goal. She denies nausea, vomiting, or muscle weakness. I discussed labs with the patient today.  Assessment/Plan:   1. Other depression, with emotional  eating Behavior modification techniques were discussed today to help Mikyah deal with her emotional/non-hunger eating behaviors. We will refill Wellbutrin for 1 month. Orders and follow up as documented in patient record.   - buPROPion (WELLBUTRIN SR) 200 MG 12 hr tablet; Take 1 tablet (200 mg total) by mouth 2 (two) times daily.  Dispense: 60 tablet; Refill: 0  2. Insulin resistance Layne will continue to work on weight loss, diet, exercise, and decreasing simple carbohydrates to help decrease the risk of diabetes. Lael agreed to follow-up with [place] as directed to closely monitor her progress.  3. Vitamin D deficiency Low Vitamin D level contributes to fatigue and are associated with obesity, breast, and colon cancer. We will refill prescription Vitamin D for 1 month. Sabrina will follow-up for routine testing of Vitamin D, at least 2-3 times per year to avoid over-replacement.  - Vitamin D, Ergocalciferol, (DRISDOL) 1.25 MG (50000 UNIT) CAPS capsule; Take 1 capsule (50,000 Units total) by mouth every 7 (seven) days.  Dispense: 4 capsule; Refill: 0  4. Class 3 severe obesity with serious comorbidity and body mass index (BMI) of 45.0 to 49.9 in adult, unspecified obesity type (HCC) Tykerria is currently in the action stage of change. As such, her goal is to continue with weight loss efforts. She has agreed to keeping a food journal and adhering to recommended goals of 1200 calories and 75+ grams of protein daily.   Exercise goals: Jonice is to continue her current exercise regimen as is.  Behavioral modification strategies: meal planning and cooking strategies.  Levette has agreed to follow-up with our clinic in 3 weeks. She was informed of  the importance of frequent follow-up visits to maximize her success with intensive lifestyle modifications for her multiple health conditions.  Objective:   VITALS: Per patient if applicable, see vitals. GENERAL: Alert and in no acute  distress. CARDIOPULMONARY: No increased WOB. Speaking in clear sentences.  PSYCH: Pleasant and cooperative. Speech normal rate and rhythm. Affect is appropriate. Insight and judgement are appropriate. Attention is focused, linear, and appropriate.  NEURO: Oriented as arrived to appointment on time with no prompting.   Lab Results  Component Value Date   CREATININE 0.76 03/25/2019   BUN 12 03/25/2019   NA 144 03/25/2019   K 4.6 03/25/2019   CL 105 03/25/2019   CO2 25 03/25/2019   Lab Results  Component Value Date   ALT 13 03/25/2019   AST 12 03/25/2019   ALKPHOS 55 03/25/2019   BILITOT 0.5 03/25/2019   Lab Results  Component Value Date   HGBA1C 4.6 (L) 03/25/2019   HGBA1C 4.7 (L) 11/26/2018   HGBA1C 4.9 11/07/2017   Lab Results  Component Value Date   INSULIN 9.7 03/25/2019   INSULIN 6.1 11/26/2018   INSULIN 6.7 11/07/2017   Lab Results  Component Value Date   TSH 0.984 11/07/2017   Lab Results  Component Value Date   CHOL 165 11/26/2018   HDL 62 11/26/2018   LDLCALC 94 11/26/2018   TRIG 44 11/26/2018   Lab Results  Component Value Date   WBC 6.8 11/07/2017   HGB 14.8 11/07/2017   HCT 43.6 11/07/2017   MCV 92 11/07/2017   PLT 277 03/22/2011   Lab Results  Component Value Date   FERRITIN 32 03/22/2011    Attestation Statements:   Reviewed by clinician on day of visit: allergies, medications, problem list, medical history, surgical history, family history, social history, and previous encounter notes.  Time spent on visit including pre-visit chart review and post-visit charting and care was 30 minutes.    I, Trixie Dredge, am acting as transcriptionist for Dennard Nip, MD.  I have reviewed the above documentation for accuracy and completeness, and I agree with the above. - Dennard Nip, MD

## 2019-04-08 MED FILL — VIT D2 1.25 MG (50,000 UNIT: 1.25 MG | 28 days supply | Qty: 4 | Fill #0

## 2019-04-22 ENCOUNTER — Other Ambulatory Visit: Payer: Self-pay

## 2019-04-22 ENCOUNTER — Encounter (INDEPENDENT_AMBULATORY_CARE_PROVIDER_SITE_OTHER): Payer: Self-pay | Admitting: Family Medicine

## 2019-04-22 ENCOUNTER — Telehealth (INDEPENDENT_AMBULATORY_CARE_PROVIDER_SITE_OTHER): Payer: No Typology Code available for payment source | Admitting: Family Medicine

## 2019-04-22 DIAGNOSIS — E559 Vitamin D deficiency, unspecified: Secondary | ICD-10-CM | POA: Diagnosis not present

## 2019-04-22 DIAGNOSIS — F3289 Other specified depressive episodes: Secondary | ICD-10-CM

## 2019-04-22 DIAGNOSIS — Z6841 Body Mass Index (BMI) 40.0 and over, adult: Secondary | ICD-10-CM | POA: Diagnosis not present

## 2019-04-22 MED ORDER — BUPROPION HCL ER (SR) 200 MG PO TB12: 200.0000 mg | ORAL_TABLET | Freq: Two times a day (BID) | ORAL | 0 refills | Status: DC

## 2019-04-22 MED ORDER — SAXENDA 18 MG/3ML ~~LOC~~ SOPN: 3.0000 mg | PEN_INJECTOR | Freq: Every day | SUBCUTANEOUS | 0 refills | Status: DC

## 2019-04-22 MED ORDER — VITAMIN D (ERGOCALCIFEROL) 1.25 MG (50000 UNIT) PO CAPS: 50000.0000 [IU] | ORAL_CAPSULE | ORAL | 0 refills | Status: DC

## 2019-04-22 MED FILL — SAXENDA 18 MG/3 ML PEN: 18 | 30 days supply | Qty: 15 | Fill #0

## 2019-04-22 NOTE — Progress Notes (Signed)
TeleHealth Visit:  Due to the COVID-19 pandemic, this visit was completed with telemedicine (audio/video) technology to reduce patient and provider exposure as well as to preserve personal protective equipment.   Yakima has verbally consented to this TeleHealth visit. The patient is located at home, the provider is located at the [facility] office. The participants in this visit include the listed provider and patient. Raveena was unable to use realtime audiovisual technology today and the telehealth visit was conducted via telephone.   Chief Complaint: OBESITY Cheetara is here to discuss her progress with her obesity treatment plan along with follow-up of her obesity related diagnoses. Rickiya is on keeping a food journal and adhering to recommended goals of 1200 calories and 85+ grams of protein daily and states she is following her eating plan approximately 85% of the time. Adali states she is walking for 15 minutes 2-3 times per week.  Today's visit was #: 26 Starting weight: 296 lbs Starting date: 11/07/2017  Interim History: Imya feels she is doing well maintaining her weight with food journaling. She is tolerating Saxenda well, and she does note some late morning and early afternoon hunger at times.  Subjective:   1. Vitamin D deficiency Channelle's Vit D is slowing improving, but bot yet at goal. She denies nausea, vomiting, or muscle weakness.  2. Other depression, with emotional eating Love continues to do well on her medications. Her blood pressure has been stable in the past and no change in headache, etc. She denies insomnia.  Assessment/Plan:   1. Vitamin D deficiency Low Vitamin D level contributes to fatigue and are associated with obesity, breast, and colon cancer. We will refill prescription Vitamin D for 1 month. Lillith will follow-up for routine testing of Vitamin D, at least 2-3 times per year to avoid over-replacement.  - Vitamin D,  Ergocalciferol, (DRISDOL) 1.25 MG (50000 UNIT) CAPS capsule; Take 1 capsule (50,000 Units total) by mouth every 7 (seven) days.  Dispense: 4 capsule; Refill: 0  2. Other depression, with emotional eating Behavior modification techniques were discussed today to help Jamarria deal with her emotional/non-hunger eating behaviors. We will refill Wellbutrin for 1 month, and will recheck her blood pressure at her next in office visit. Orders and follow up as documented in patient record.   - buPROPion (WELLBUTRIN SR) 200 MG 12 hr tablet; Take 1 tablet (200 mg total) by mouth 2 (two) times daily.  Dispense: 60 tablet; Refill: 0  3. Class 3 severe obesity with serious comorbidity and body mass index (BMI) of 45.0 to 49.9 in adult, unspecified obesity type (HCC) Brookelynn is currently in the action stage of change. As such, her goal is to continue with weight loss efforts. She has agreed to keeping a food journal and adhering to recommended goals of 1200 calories and 75+ grams of protein daily.   We discussed various medication options to help Arfa with her weight loss efforts and we both agreed to continue Saxenda 3 mg and we will refill for 1 month. Azariah was advised to increase her morning protein to help decrease her hunger. Her goal is of at least 30 grams of protein before lunch.  - Liraglutide -Weight Management (SAXENDA) 18 MG/3ML SOPN; Inject 0.5 mLs (3 mg total) into the skin daily.  Dispense: 5 pen; Refill: 0  Exercise goals: As is.  Behavioral modification strategies: better snacking choices.  Ivonne has agreed to follow-up with our clinic in 3 to 4 weeks. She was informed  of the importance of frequent follow-up visits to maximize her success with intensive lifestyle modifications for her multiple health conditions.  Objective:   VITALS: Per patient if applicable, see vitals. GENERAL: Alert and in no acute distress. CARDIOPULMONARY: No increased WOB. Speaking in clear sentences.  PSYCH:  Pleasant and cooperative. Speech normal rate and rhythm. Affect is appropriate. Insight and judgement are appropriate. Attention is focused, linear, and appropriate.  NEURO: Oriented as arrived to appointment on time with no prompting.   Lab Results  Component Value Date   CREATININE 0.76 03/25/2019   BUN 12 03/25/2019   NA 144 03/25/2019   K 4.6 03/25/2019   CL 105 03/25/2019   CO2 25 03/25/2019   Lab Results  Component Value Date   ALT 13 03/25/2019   AST 12 03/25/2019   ALKPHOS 55 03/25/2019   BILITOT 0.5 03/25/2019   Lab Results  Component Value Date   HGBA1C 4.6 (L) 03/25/2019   HGBA1C 4.7 (L) 11/26/2018   HGBA1C 4.9 11/07/2017   Lab Results  Component Value Date   INSULIN 9.7 03/25/2019   INSULIN 6.1 11/26/2018   INSULIN 6.7 11/07/2017   Lab Results  Component Value Date   TSH 0.984 11/07/2017   Lab Results  Component Value Date   CHOL 165 11/26/2018   HDL 62 11/26/2018   LDLCALC 94 11/26/2018   TRIG 44 11/26/2018   Lab Results  Component Value Date   WBC 6.8 11/07/2017   HGB 14.8 11/07/2017   HCT 43.6 11/07/2017   MCV 92 11/07/2017   PLT 277 03/22/2011   Lab Results  Component Value Date   FERRITIN 32 03/22/2011    Attestation Statements:   Reviewed by clinician on day of visit: allergies, medications, problem list, medical history, surgical history, family history, social history, and previous encounter notes.  Time spent on visit including pre-visit chart review and post-visit charting and care was 30 minutes.    I, Trixie Dredge, am acting as transcriptionist for Dennard Nip, MD.  I have reviewed the above documentation for accuracy and completeness, and I agree with the above. - Dennard Nip, MD

## 2019-05-04 MED FILL — DICLOFENAC SODIUM 1 % GEL: 1 | 16 days supply | Qty: 500 | Fill #0

## 2019-05-15 ENCOUNTER — Ambulatory Visit: Payer: Self-pay | Admitting: Rehabilitative and Restorative Service Providers"

## 2019-05-18 ENCOUNTER — Ambulatory Visit (INDEPENDENT_AMBULATORY_CARE_PROVIDER_SITE_OTHER): Payer: No Typology Code available for payment source | Admitting: Family Medicine

## 2019-05-21 ENCOUNTER — Other Ambulatory Visit (HOSPITAL_BASED_OUTPATIENT_CLINIC_OR_DEPARTMENT_OTHER): Payer: Self-pay | Admitting: Family Medicine

## 2019-05-21 MED FILL — VERAPAMIL ER PM 100 MG CAP: 100 | 90 days supply | Qty: 180 | Fill #0

## 2019-05-26 MED FILL — VIT D2 1.25 MG (50,000 UNIT: 1.25 MG | 28 days supply | Qty: 4 | Fill #0

## 2019-06-02 ENCOUNTER — Encounter (INDEPENDENT_AMBULATORY_CARE_PROVIDER_SITE_OTHER): Payer: Self-pay | Admitting: Family Medicine

## 2019-06-02 ENCOUNTER — Other Ambulatory Visit: Payer: Self-pay

## 2019-06-02 ENCOUNTER — Ambulatory Visit (INDEPENDENT_AMBULATORY_CARE_PROVIDER_SITE_OTHER): Payer: No Typology Code available for payment source | Admitting: Family Medicine

## 2019-06-02 VITALS — BP 118/75 | HR 84 | Temp 98.1°F | Ht 66.0 in | Wt 267.0 lb

## 2019-06-02 DIAGNOSIS — Z9189 Other specified personal risk factors, not elsewhere classified: Secondary | ICD-10-CM

## 2019-06-02 DIAGNOSIS — Z6841 Body Mass Index (BMI) 40.0 and over, adult: Secondary | ICD-10-CM

## 2019-06-02 DIAGNOSIS — F3289 Other specified depressive episodes: Secondary | ICD-10-CM | POA: Diagnosis not present

## 2019-06-02 DIAGNOSIS — E559 Vitamin D deficiency, unspecified: Secondary | ICD-10-CM

## 2019-06-03 MED ORDER — VITAMIN D (ERGOCALCIFEROL) 1.25 MG (50000 UNIT) PO CAPS: 50000.0000 [IU] | ORAL_CAPSULE | ORAL | 0 refills | Status: DC

## 2019-06-03 MED ORDER — SAXENDA 18 MG/3ML ~~LOC~~ SOPN: 3.0000 mg | PEN_INJECTOR | Freq: Every day | SUBCUTANEOUS | 0 refills | Status: DC

## 2019-06-03 MED ORDER — BD PEN NEEDLE NANO 2ND GEN 32G X 4 MM MISC: 1.0000 | Freq: Every day | 0 refills | Status: DC

## 2019-06-03 MED ORDER — BUPROPION HCL ER (SR) 200 MG PO TB12: 200.0000 mg | ORAL_TABLET | Freq: Two times a day (BID) | ORAL | 0 refills | Status: DC

## 2019-06-03 MED FILL — SAXENDA 18 MG/3 ML PEN: 18 | 30 days supply | Qty: 15 | Fill #0

## 2019-06-03 MED FILL — BUPROPION HCL SR 200 MG TAB: 200 | 30 days supply | Qty: 60 | Fill #0

## 2019-06-03 MED FILL — TECHLITE PEN NDL 32GX1/4: 32G X 6 MM | 90 days supply | Qty: 100 | Fill #0

## 2019-06-03 NOTE — Progress Notes (Signed)
Chief Complaint:   OBESITY Laura Brown is here to discuss her progress with her obesity treatment plan along with follow-up of her obesity related diagnoses. Laura Brown is on keeping a food journal and adhering to recommended goals of 1200 calories and 75+ grams of protein daily and states she is following her eating plan approximately 50% of the time. Laura Brown states she is doing 0 minutes 0 times per week.  Today's visit was #: 27 Starting weight: 296 lbs Starting date: 11/07/2017 Today's weight: 267 lbs Today's date: 06/02/2019 Total lbs lost to date: 29 Total lbs lost since last in-office visit: 13  Interim History: Laura Brown states today is the 4 month anniversary of her father's passing. She states that the last month has been the hardest since his death. She estimates to have gained 9 lbs in April, related to the holidays and candy eating.  Subjective:   1. Vitamin D deficiency Laura Brown is tolerating prescription strength Vit D well.  2. Other depression, with emotional eating Laura Brown notes increased sleeping and lack of interest in activities, and increased emotional eating.  3. At risk for depression Laura Brown is at elevated risk of depression due to one or more of the following: the recent death of her father.  Assessment/Plan:   1. Vitamin D deficiency Low Vitamin D level contributes to fatigue and are associated with obesity, breast, and colon cancer. We will refill prescription Vitamin D for 1 month. Laura Brown will follow-up for routine testing of Vitamin D, at least 2-3 times per year to avoid over-replacement.  - Vitamin D, Ergocalciferol, (DRISDOL) 1.25 MG (50000 UNIT) CAPS capsule; Take 1 capsule (50,000 Units total) by mouth every 7 (seven) days.  Dispense: 4 capsule; Refill: 0  2. Other depression, with emotional eating Behavior modification techniques were discussed today to help Ayza deal with her emotional/non-hunger eating behaviors. We will refill bupropion SR for 1 month.  Orders and follow up as documented in patient record.   - buPROPion (WELLBUTRIN SR) 200 MG 12 hr tablet; Take 1 tablet (200 mg total) by mouth 2 (two) times daily.  Dispense: 60 tablet; Refill: 0  3. At risk for depression Laura Brown was given approximately 15 minutes of depression risk counseling today. She has risk factors for depression including the recent death of her father. We discussed the importance of a healthy work life balance, a healthy relationship with food and a good support system.  Repetitive spaced learning was employed today to elicit superior memory formation and behavioral change.  4. Class 3 severe obesity with serious comorbidity and body mass index (BMI) of 40.0 to 44.9 in adult, unspecified obesity type (HCC) Laura Brown is currently in the action stage of change. As such, her goal is to continue with weight loss efforts. She has agreed to keeping a food journal and adhering to recommended goals of 1200 calories and 75+ grams of protein daily.   We discussed various medication options to help Laura Brown with her weight loss efforts and we both agreed to continue Saxenda, and we will refill Saxenda for 1 month, and refill pen needles #100 with no refills.  - Liraglutide -Weight Management (SAXENDA) 18 MG/3ML SOPN; Inject 0.5 mLs (3 mg total) into the skin daily.  Dispense: 5 pen; Refill: 0 - Insulin Pen Needle (BD PEN NEEDLE NANO 2ND GEN) 32G X 4 MM MISC; 1 Device by Does not apply route daily.  Dispense: 100 each; Refill: 0  Exercise goals: No exercise has been prescribed at this time.  Behavioral modification strategies: increasing lean protein intake, decreasing simple carbohydrates, increasing water intake, no skipping meals, meal planning and cooking strategies and emotional eating strategies.  Laura Brown has agreed to follow-up with our clinic in 2 weeks. She was informed of the importance of frequent follow-up visits to maximize her success with intensive lifestyle modifications  for her multiple health conditions.   Objective:   Blood pressure 118/75, pulse 84, temperature 98.1 F (36.7 C), temperature source Oral, height 5\' 6"  (1.676 m), weight 267 lb (121.1 kg), SpO2 100 %. Body mass index is 43.09 kg/m.  General: Cooperative, alert, well developed, in no acute distress. HEENT: Conjunctivae and lids unremarkable. Cardiovascular: Regular rhythm.  Lungs: Normal work of breathing. Neurologic: No focal deficits.   Lab Results  Component Value Date   CREATININE 0.76 03/25/2019   BUN 12 03/25/2019   NA 144 03/25/2019   K 4.6 03/25/2019   CL 105 03/25/2019   CO2 25 03/25/2019   Lab Results  Component Value Date   ALT 13 03/25/2019   AST 12 03/25/2019   ALKPHOS 55 03/25/2019   BILITOT 0.5 03/25/2019   Lab Results  Component Value Date   HGBA1C 4.6 (L) 03/25/2019   HGBA1C 4.7 (L) 11/26/2018   HGBA1C 4.9 11/07/2017   Lab Results  Component Value Date   INSULIN 9.7 03/25/2019   INSULIN 6.1 11/26/2018   INSULIN 6.7 11/07/2017   Lab Results  Component Value Date   TSH 0.984 11/07/2017   Lab Results  Component Value Date   CHOL 165 11/26/2018   HDL 62 11/26/2018   LDLCALC 94 11/26/2018   TRIG 44 11/26/2018   Lab Results  Component Value Date   WBC 6.8 11/07/2017   HGB 14.8 11/07/2017   HCT 43.6 11/07/2017   MCV 92 11/07/2017   PLT 277 03/22/2011   Lab Results  Component Value Date   FERRITIN 32 03/22/2011   Attestation Statements:   Reviewed by clinician on day of visit: allergies, medications, problem list, medical history, surgical history, family history, social history, and previous encounter notes.   I, Trixie Dredge, am acting as transcriptionist for Dennard Nip, MD.  I have reviewed the above documentation for accuracy and completeness, and I agree with the above. -  Dennard Nip, MD

## 2019-06-15 ENCOUNTER — Ambulatory Visit (INDEPENDENT_AMBULATORY_CARE_PROVIDER_SITE_OTHER): Payer: No Typology Code available for payment source | Admitting: Family Medicine

## 2019-06-22 ENCOUNTER — Ambulatory Visit (INDEPENDENT_AMBULATORY_CARE_PROVIDER_SITE_OTHER): Payer: No Typology Code available for payment source | Admitting: Family Medicine

## 2019-06-22 ENCOUNTER — Other Ambulatory Visit: Payer: Self-pay

## 2019-06-22 ENCOUNTER — Encounter (INDEPENDENT_AMBULATORY_CARE_PROVIDER_SITE_OTHER): Payer: Self-pay | Admitting: Family Medicine

## 2019-06-22 VITALS — BP 115/73 | HR 81 | Temp 98.0°F | Ht 66.0 in | Wt 264.0 lb

## 2019-06-22 DIAGNOSIS — Z9189 Other specified personal risk factors, not elsewhere classified: Secondary | ICD-10-CM

## 2019-06-22 DIAGNOSIS — Z6841 Body Mass Index (BMI) 40.0 and over, adult: Secondary | ICD-10-CM

## 2019-06-22 DIAGNOSIS — E559 Vitamin D deficiency, unspecified: Secondary | ICD-10-CM

## 2019-06-22 DIAGNOSIS — F3289 Other specified depressive episodes: Secondary | ICD-10-CM | POA: Diagnosis not present

## 2019-06-23 MED ORDER — VITAMIN D (ERGOCALCIFEROL) 1.25 MG (50000 UNIT) PO CAPS: 50000.0000 [IU] | ORAL_CAPSULE | ORAL | 0 refills | Status: DC

## 2019-06-23 MED ORDER — BUPROPION HCL ER (SR) 200 MG PO TB12: 200.0000 mg | ORAL_TABLET | Freq: Two times a day (BID) | ORAL | 0 refills | Status: DC

## 2019-06-23 MED FILL — VIT D2 1.25 MG (50,000 UNIT: 1.25 MG | 28 days supply | Qty: 4 | Fill #0

## 2019-06-23 NOTE — Progress Notes (Signed)
Chief Complaint:   OBESITY Laura Brown is here to discuss her progress with her obesity treatment plan along with follow-up of her obesity related diagnoses. Laura Brown is on keeping a food journal and adhering to recommended goals of 1200 calories and 75+ grams of protein daily and states she is following her eating plan approximately 50% of the time. Laura Brown states she is doing 0 minutes 0 times per week.  Today's visit was #: 28 Starting weight: 296 lbs Starting date: 11/07/2017 Today's weight: 264 lbs Today's date: 06/22/2019 Total lbs lost to date: 32 Total lbs lost since last in-office visit: 3  Interim History: Laura Brown continues to struggle with grief after the death of her father. They recently held his memorial service. She reports increased emotional eating due to grief and stress. She prefers to track her calories and protein in a written journal versus [employer] app.  Subjective:   1. Vitamin D deficiency Laura Brown's Vit D level on 03/25/2019 was 44.6. She is on prescription strength Vit D supplementation, and is tolerating it well.  2. Other depression, with emotional eating Laura Brown reports stable mood, and she denies suicidal ideas or homicidal ideas. She is struggling with the recent death of her father. She reports an excellent support system of family and friends. She recently got established with [facility] therapist and plans to participate in weekly therapy sessions.  3. At risk for osteoporosis Laura Brown is at higher risk of osteopenia and osteoporosis due to Vitamin D deficiency.   Assessment/Plan:   1. Vitamin D deficiency Low Vitamin D level contributes to fatigue and are associated with obesity, breast, and colon cancer. We will refill prescription Vitamin D for 1 month. Laura Brown will follow-up for routine testing of Vitamin D, at least 2-3 times per year to avoid over-replacement. We will recheck labs at her next office visit.  - Vitamin D, Ergocalciferol, (DRISDOL) 1.25 MG (50000  UNIT) CAPS capsule; Take 1 capsule (50,000 Units total) by mouth every 7 (seven) days.  Dispense: 4 capsule; Refill: 0  2. Other depression, with emotional eating Behavior modification techniques were discussed today to help Laura Brown deal with her emotional/non-hunger eating behaviors. Laura Brown will continue her anti-depressant regimen and therapy. We will refill bupropion for 1 month. Orders and follow up as documented in patient record.   - buPROPion (WELLBUTRIN SR) 200 MG 12 hr tablet; Take 1 tablet (200 mg total) by mouth 2 (two) times daily.  Dispense: 60 tablet; Refill: 0  3. At risk for osteoporosis Laura Brown was given approximately 15 minutes of osteoporosis prevention counseling today. Laura Brown is at risk for osteopenia and osteoporosis due to her Vitamin D deficiency. She was encouraged to take her Vitamin D and follow her higher calcium diet and increase strengthening exercise to help strengthen her bones and decrease her risk of osteopenia and osteoporosis.  Repetitive spaced learning was employed today to elicit superior memory formation and behavioral change.  4. Class 3 severe obesity with serious comorbidity and body mass index (BMI) of 40.0 to 44.9 in adult, unspecified obesity type (HCC) Laura Brown is currently in the action stage of change. As such, her goal is to continue with weight loss efforts. She has agreed to keeping a food journal and adhering to recommended goals of 1200 calories and 75+ grams of protein daily.   Exercise goals: No exercise has been prescribed at this time.  Behavioral modification strategies: increasing lean protein intake, decreasing simple carbohydrates, no skipping meals, meal planning and cooking strategies and emotional eating  strategies.  Laura Brown has agreed to follow-up with our clinic in 2 to 3 weeks. She was informed of the importance of frequent follow-up visits to maximize her success with intensive lifestyle modifications for her multiple health  conditions.   Objective:   Blood pressure 115/73, pulse 81, temperature 98 F (36.7 C), temperature source Oral, height 5\' 6"  (1.676 m), weight 264 lb (119.7 kg), SpO2 100 %. Body mass index is 42.61 kg/m.  General: Cooperative, alert, well developed, in no acute distress. HEENT: Conjunctivae and lids unremarkable. Cardiovascular: Regular rhythm.  Lungs: Normal work of breathing. Neurologic: No focal deficits.   Lab Results  Component Value Date   CREATININE 0.76 03/25/2019   BUN 12 03/25/2019   NA 144 03/25/2019   K 4.6 03/25/2019   CL 105 03/25/2019   CO2 25 03/25/2019   Lab Results  Component Value Date   ALT 13 03/25/2019   AST 12 03/25/2019   ALKPHOS 55 03/25/2019   BILITOT 0.5 03/25/2019   Lab Results  Component Value Date   HGBA1C 4.6 (L) 03/25/2019   HGBA1C 4.7 (L) 11/26/2018   HGBA1C 4.9 11/07/2017   Lab Results  Component Value Date   INSULIN 9.7 03/25/2019   INSULIN 6.1 11/26/2018   INSULIN 6.7 11/07/2017   Lab Results  Component Value Date   TSH 0.984 11/07/2017   Lab Results  Component Value Date   CHOL 165 11/26/2018   HDL 62 11/26/2018   LDLCALC 94 11/26/2018   TRIG 44 11/26/2018   Lab Results  Component Value Date   WBC 6.8 11/07/2017   HGB 14.8 11/07/2017   HCT 43.6 11/07/2017   MCV 92 11/07/2017   PLT 277 03/22/2011   Lab Results  Component Value Date   FERRITIN 32 03/22/2011   Attestation Statements:   Reviewed by clinician on day of visit: allergies, medications, problem list, medical history, surgical history, family history, social history, and previous encounter notes.   I, 03/24/2011, am acting as transcriptionist for Burt Knack, MD.  I have reviewed the above documentation for accuracy and completeness, and I agree with the above. -  Quillian Quince, MD

## 2019-06-24 ENCOUNTER — Other Ambulatory Visit (HOSPITAL_BASED_OUTPATIENT_CLINIC_OR_DEPARTMENT_OTHER): Payer: Self-pay | Admitting: Family Medicine

## 2019-06-24 MED FILL — DULoxetine HCL 60 MG CPEP: 60 | 60 days supply | Qty: 180 | Fill #0

## 2019-07-15 ENCOUNTER — Encounter (INDEPENDENT_AMBULATORY_CARE_PROVIDER_SITE_OTHER): Payer: Self-pay | Admitting: Family Medicine

## 2019-07-15 ENCOUNTER — Ambulatory Visit (INDEPENDENT_AMBULATORY_CARE_PROVIDER_SITE_OTHER): Payer: No Typology Code available for payment source | Admitting: Family Medicine

## 2019-07-15 ENCOUNTER — Other Ambulatory Visit: Payer: Self-pay

## 2019-07-15 VITALS — BP 111/56 | HR 70 | Temp 97.8°F | Ht 66.0 in | Wt 263.0 lb

## 2019-07-15 DIAGNOSIS — Z9884 Bariatric surgery status: Secondary | ICD-10-CM | POA: Diagnosis not present

## 2019-07-15 DIAGNOSIS — E8881 Metabolic syndrome: Secondary | ICD-10-CM | POA: Diagnosis not present

## 2019-07-15 DIAGNOSIS — Z6841 Body Mass Index (BMI) 40.0 and over, adult: Secondary | ICD-10-CM

## 2019-07-15 DIAGNOSIS — Z9189 Other specified personal risk factors, not elsewhere classified: Secondary | ICD-10-CM | POA: Diagnosis not present

## 2019-07-15 DIAGNOSIS — E559 Vitamin D deficiency, unspecified: Secondary | ICD-10-CM

## 2019-07-15 DIAGNOSIS — F3289 Other specified depressive episodes: Secondary | ICD-10-CM | POA: Diagnosis not present

## 2019-07-15 MED ORDER — VITAMIN D (ERGOCALCIFEROL) 1.25 MG (50000 UNIT) PO CAPS: 50000.0000 [IU] | ORAL_CAPSULE | ORAL | 0 refills | Status: DC

## 2019-07-15 MED ORDER — BUPROPION HCL ER (SR) 200 MG PO TB12: 200.0000 mg | ORAL_TABLET | Freq: Two times a day (BID) | ORAL | 0 refills | Status: DC

## 2019-07-15 MED FILL — VIT D2 1.25 MG (50,000 UNIT: 1.25 MG | 28 days supply | Qty: 4 | Fill #0

## 2019-07-15 MED FILL — BUPROPION HCL SR 200 MG TAB: 200 | 30 days supply | Qty: 60 | Fill #0

## 2019-07-16 LAB — COMPREHENSIVE METABOLIC PANEL
ALT: 10 IU/L (ref 0–32)
AST: 13 IU/L (ref 0–40)
Albumin/Globulin Ratio: 1.4 (ref 1.2–2.2)
Albumin: 4 g/dL (ref 3.8–4.8)
Alkaline Phosphatase: 67 IU/L (ref 48–121)
BUN/Creatinine Ratio: 16 (ref 9–23)
BUN: 13 mg/dL (ref 6–24)
Bilirubin Total: 0.3 mg/dL (ref 0.0–1.2)
CO2: 25 mmol/L (ref 20–29)
Calcium: 9.3 mg/dL (ref 8.7–10.2)
Chloride: 105 mmol/L (ref 96–106)
Creatinine, Ser: 0.82 mg/dL (ref 0.57–1.00)
GFR calc Af Amer: 96 mL/min/{1.73_m2} (ref >59)
GFR calc non Af Amer: 84 mL/min/{1.73_m2} (ref >59)
Globulin, Total: 2.9 g/dL (ref 1.5–4.5)
Glucose: 80 mg/dL (ref 65–99)
Potassium: 4.7 mmol/L (ref 3.5–5.2)
Sodium: 142 mmol/L (ref 134–144)
Total Protein: 6.9 g/dL (ref 6.0–8.5)

## 2019-07-16 LAB — VITAMIN D 25 HYDROXY (VIT D DEFICIENCY, FRACTURES): Vit D, 25-Hydroxy: 41 ng/mL (ref 30.0–100.0)

## 2019-07-16 LAB — HEMOGLOBIN A1C
Est. average glucose Bld gHb Est-mCnc: 85 mg/dL
Hgb A1c MFr Bld: 4.6 % — ABNORMAL LOW (ref 4.8–5.6)

## 2019-07-16 LAB — INSULIN, RANDOM: INSULIN: 8.6 u[IU]/mL (ref 2.6–24.9)

## 2019-07-20 NOTE — Progress Notes (Signed)
Chief Complaint:   OBESITY Thi is here to discuss her progress with her obesity treatment plan along with follow-up of her obesity related diagnoses. Tujuana is on keeping a food journal and adhering to recommended goals of 1200 calories and 75+ grams of protein daily and states she is following her eating plan approximately 75% of the time. Pascha states she is doing 0 minutes 0 times per week.  Today's visit was #: 5 Starting weight: 296 lbs Starting date: 11/07/2017 Today's weight: 263 lbs Today's date: 07/15/2019 Total lbs lost to date: 33 Total lbs lost since last in-office visit: 1  Interim History: Cattaleya is still having sugar cravings "lones candy". She is status post bariatric surgery in 2013. She notes it is touch getting protein in and all calories on some days. She understands how important protein is and she states she can do better. She notes life stressors with work and taking care of 2 children, but they are going to [place] next week for 7 days.  Subjective:   1. Vitamin D deficiency Arrin is tolerating her medications well, and she denies side effect. She denies nausea, vomiting or GI upset.   2. Insulin resistance Jasmene's fasting insulin was elevated on her last labs and A1c was within normal limits.  3. Other depression, with emotional eating Felita notes Wellbutrin helps a lot with hunger at night. She occasionally skips doses and she can really tell the difference. She notes it helps with depression and her mood as well. She notes it helps her tolerate stress well.  4. Status post gastric bypass for obesity Buna denies issues or concerns but she knows how important protein is and she states she needs to do better.  5. At risk for hyperglycemia Zani is at increased risk for hyperglycemia due to insulin resistance.  Assessment/Plan:   1. Vitamin D deficiency Low Vitamin D level contributes to fatigue and are associated with obesity,  breast, and colon cancer. We will check labs today, and we will refill prescription Vitamin D for 1 month. Noela will follow-up for routine testing of Vitamin D, at least 2-3 times per year to avoid over-replacement.  - VITAMIN D 25 Hydroxy (Vit-D Deficiency, Fractures)  - Vitamin D, Ergocalciferol, (DRISDOL) 1.25 MG (50000 UNIT) CAPS capsule; Take 1 capsule (50,000 Units total) by mouth every 7 (seven) days.  Dispense: 4 capsule; Refill: 0  2. Insulin resistance Lindyn will continue to work on weight loss, diet, exercise, and decreasing simple carbohydrates to help decrease the risk of diabetes. We will check labs today. Lakaisha agreed to follow-up with [place] as directed to closely monitor her progress.  - Comprehensive metabolic panel - Hemoglobin A1c - Insulin, random  3. Other depression, with emotional eating Behavior modification techniques were discussed today to help Edelmira deal with her emotional/non-hunger eating behaviors. We will refill Wellbutrin SR for 1 month. Mindful eating behaviors were discussed with the patient. Orders and follow up as documented in patient record.   - buPROPion (WELLBUTRIN SR) 200 MG 12 hr tablet; Take 1 tablet (200 mg total) by mouth 2 (two) times daily.  Dispense: 60 tablet; Refill: 0  4. Status post gastric bypass for obesity We will check labs today.  We will continue to monitor for nutritional deficiencies. She will continue with diet and weight loss.  - Comprehensive metabolic panel - Hemoglobin A1c - Insulin, random - VITAMIN D 25 Hydroxy (Vit-D Deficiency, Fractures)  5. At risk for hyperglycemia Insiya was given  approximately 30 minutes of counseling today regarding prevention of hyperglycemia. She was advised of hyperglycemia causes and the fact hyperglycemia is often asymptomatic. Cylie was instructed to avoid skipping meals, eat regular protein rich meals and schedule low calorie but protein rich snacks as needed.   Repetitive spaced  learning was employed today to elicit superior memory formation and behavioral change  6. Class 3 severe obesity with serious comorbidity and body mass index (BMI) of 40.0 to 44.9 in adult, unspecified obesity type (HCC) Dereona is currently in the action stage of change. As such, her goal is to continue with weight loss efforts. She has agreed to keeping a food journal and adhering to recommended goals of 1200 calories and 80+ grams of protein daily.   I stressed the importance of taking all of her nutrients in per day.  Exercise goals: All adults should avoid inactivity. Some physical activity is better than none, and adults who participate in any amount of physical activity gain some health benefits.  Behavioral modification strategies: increasing lean protein intake, no skipping meals, meal planning and cooking strategies and travel eating strategies.  Lucilla has agreed to follow-up with our clinic in 3 to 4 weeks. She was informed of the importance of frequent follow-up visits to maximize her success with intensive lifestyle modifications for her multiple health conditions.   Louan was informed we would discuss her lab results at her next visit unless there is a critical issue that needs to be addressed sooner. Shalini agreed to keep her next visit at the agreed upon time to discuss these results.  Objective:   Blood pressure (!) 111/56, pulse 70, temperature 97.8 F (36.6 C), temperature source Oral, height 5\' 6"  (1.676 m), weight 263 lb (119.3 kg), SpO2 100 %. Body mass index is 42.45 kg/m.  General: Cooperative, alert, well developed, in no acute distress. HEENT: Conjunctivae and lids unremarkable. Cardiovascular: Regular rhythm.  Lungs: Normal work of breathing. Neurologic: No focal deficits.   Lab Results  Component Value Date   CREATININE 0.82 07/15/2019   BUN 13 07/15/2019   NA 142 07/15/2019   K 4.7 07/15/2019   CL 105 07/15/2019   CO2 25 07/15/2019   Lab Results   Component Value Date   ALT 10 07/15/2019   AST 13 07/15/2019   ALKPHOS 67 07/15/2019   BILITOT 0.3 07/15/2019   Lab Results  Component Value Date   HGBA1C 4.6 (L) 07/15/2019   HGBA1C 4.6 (L) 03/25/2019   HGBA1C 4.7 (L) 11/26/2018   HGBA1C 4.9 11/07/2017   Lab Results  Component Value Date   INSULIN 8.6 07/15/2019   INSULIN 9.7 03/25/2019   INSULIN 6.1 11/26/2018   INSULIN 6.7 11/07/2017   Lab Results  Component Value Date   TSH 0.984 11/07/2017   Lab Results  Component Value Date   CHOL 165 11/26/2018   HDL 62 11/26/2018   LDLCALC 94 11/26/2018   TRIG 44 11/26/2018   Lab Results  Component Value Date   WBC 6.8 11/07/2017   HGB 14.8 11/07/2017   HCT 43.6 11/07/2017   MCV 92 11/07/2017   PLT 277 03/22/2011   Lab Results  Component Value Date   FERRITIN 32 03/22/2011   Attestation Statements:   Reviewed by clinician on day of visit: allergies, medications, problem list, medical history, surgical history, family history, social history, and previous encounter notes.   I, 03/24/2011, am acting as transcriptionist for Burt Knack, MD.  I have reviewed the above documentation for  accuracy and completeness, and I agree with the above. -  Dennard Nip, MD

## 2019-08-06 ENCOUNTER — Ambulatory Visit (INDEPENDENT_AMBULATORY_CARE_PROVIDER_SITE_OTHER): Payer: No Typology Code available for payment source | Admitting: Adult Health

## 2019-08-10 ENCOUNTER — Ambulatory Visit (INDEPENDENT_AMBULATORY_CARE_PROVIDER_SITE_OTHER): Payer: No Typology Code available for payment source | Admitting: Adult Health

## 2019-08-13 ENCOUNTER — Encounter (INDEPENDENT_AMBULATORY_CARE_PROVIDER_SITE_OTHER): Payer: Self-pay | Admitting: Family Medicine

## 2019-08-13 ENCOUNTER — Other Ambulatory Visit: Payer: Self-pay

## 2019-08-13 ENCOUNTER — Ambulatory Visit (INDEPENDENT_AMBULATORY_CARE_PROVIDER_SITE_OTHER): Payer: No Typology Code available for payment source | Admitting: Family Medicine

## 2019-08-13 VITALS — BP 112/69 | HR 87 | Temp 97.6°F | Ht 66.0 in | Wt 264.0 lb

## 2019-08-13 DIAGNOSIS — F3289 Other specified depressive episodes: Secondary | ICD-10-CM | POA: Diagnosis not present

## 2019-08-13 DIAGNOSIS — Z6841 Body Mass Index (BMI) 40.0 and over, adult: Secondary | ICD-10-CM

## 2019-08-13 DIAGNOSIS — Z9884 Bariatric surgery status: Secondary | ICD-10-CM

## 2019-08-13 DIAGNOSIS — E559 Vitamin D deficiency, unspecified: Secondary | ICD-10-CM | POA: Diagnosis not present

## 2019-08-13 DIAGNOSIS — Z9189 Other specified personal risk factors, not elsewhere classified: Secondary | ICD-10-CM | POA: Diagnosis not present

## 2019-08-13 DIAGNOSIS — E66813 Obesity, class 3: Secondary | ICD-10-CM

## 2019-08-13 MED ORDER — BUPROPION HCL ER (SR) 200 MG PO TB12: 200.0000 mg | ORAL_TABLET | Freq: Two times a day (BID) | ORAL | 0 refills | Status: DC

## 2019-08-13 MED ORDER — VITAMIN D (ERGOCALCIFEROL) 1.25 MG (50000 UNIT) PO CAPS: 50000.0000 [IU] | ORAL_CAPSULE | ORAL | 0 refills | Status: DC

## 2019-08-13 MED FILL — BUPROPION HCL SR 200 MG TAB: 200 | 30 days supply | Qty: 60 | Fill #0

## 2019-08-13 MED FILL — VIT D2 1.25 MG (50,000 UNIT: 1.25 MG | 28 days supply | Qty: 4 | Fill #0

## 2019-08-14 ENCOUNTER — Other Ambulatory Visit (INDEPENDENT_AMBULATORY_CARE_PROVIDER_SITE_OTHER): Payer: Self-pay | Admitting: Family Medicine

## 2019-08-17 MED ORDER — SAXENDA 18 MG/3ML ~~LOC~~ SOPN: 3.0000 mg | PEN_INJECTOR | Freq: Every day | SUBCUTANEOUS | 0 refills | Status: DC

## 2019-08-17 MED FILL — SAXENDA 18 MG/3 ML PEN: 18 | 30 days supply | Qty: 15 | Fill #0

## 2019-08-19 ENCOUNTER — Encounter (INDEPENDENT_AMBULATORY_CARE_PROVIDER_SITE_OTHER): Payer: Self-pay | Admitting: Family Medicine

## 2019-08-19 DIAGNOSIS — E559 Vitamin D deficiency, unspecified: Secondary | ICD-10-CM | POA: Insufficient documentation

## 2019-08-19 DIAGNOSIS — Z9884 Bariatric surgery status: Secondary | ICD-10-CM | POA: Insufficient documentation

## 2019-08-19 NOTE — Progress Notes (Addendum)
Chief Complaint:   OBESITY Laura Brown is here to discuss her progress with her obesity treatment plan along with follow-up of her obesity related diagnoses. Laura Brown is on keeping a food journal and adhering to recommended goals of 1200 calories and 80+ grams of protein daily and states she is following her eating plan approximately 70% of the time. Laura Brown states she is walking for 20 minutes 3 times per week.  Today's visit was #: 30 Starting weight: 296 lbs Starting date: 11/07/2017 Today's weight: 264 lbs Today's date: 08/13/2019 Total lbs lost to date: 32 Total lbs lost since last in-office visit: 0  Interim History: Laura Brown has had slow but steady weight loss since strating our program in 2019. Laura Brown notes increased cravings at night. She craves sweets. She notes she that she quit sweet tea 1 year ago and is proud of that. She is working on increasing protein. She is on Saxenda 1.8 mg daily.  Subjective:   1. Vitamin D deficiency Tari's last Vit D level has decreased to 41 from 44.6. She is on weekly prescription Vit D.  2. S/P laparoscopic sleeve gastrectomy Laura Brown had gastric sleeve in 2013. She takes multivitamins daily and Vit D. She still has restriction with portions.  3. Other depression, with emotional eating Laura Brown often forgets her second dose of bupropion. She notes cravings in the evening. She feels the bupropion helps.  4. At risk for malnutrition Laura Brown is at increased risk for malnutrition due to status post sleeve gastrectomy.  Assessment/Plan:   1. Vitamin D deficiency Low Vitamin D level contributes to fatigue and are associated with obesity, breast, and colon cancer. We will refill prescription Vitamin D for 1 month, and Laura Brown will add 2,000 IU of Vit D3 daily. She will follow-up for routine testing of Vitamin D, at least 2-3 times per year to avoid over-replacement.  - Vitamin D, Ergocalciferol, (DRISDOL) 1.25 MG (50000 UNIT) CAPS capsule; Take 1 capsule  (50,000 Units total) by mouth every 7 (seven) days.  Dispense: 4 capsule; Refill: 0  2. S/P laparoscopic sleeve gastrectomy Laura Brown will continue to increase protein, and take her multivitamins with iron daily.  3. Other depression, with emotional eating Behavior modification techniques were discussed today to help Laura Brown deal with her emotional/non-hunger eating behaviors. Laura Brown is to set an alarm for her second dose of bupropion. We will refill bupropion for 1 month. Orders and follow up as documented in patient record.   - buPROPion (WELLBUTRIN SR) 200 MG 12 hr tablet; Take 1 tablet (200 mg total) by mouth 2 (two) times daily.  Dispense: 60 tablet; Refill: 0  4. At risk for malnutrition Laura Brown was given approximately 15 minutes of counseling today regarding prevention of malnutrition and ways to meet macronutrient goals..   5. Class 3 severe obesity with serious comorbidity and body mass index (BMI) of 40.0 to 44.9 in adult, unspecified obesity type (HCC) Laura Brown is currently in the action stage of change. As such, her goal is to continue with weight loss efforts. She has agreed to keeping a food journal and adhering to recommended goals of 1200 calories and 80 grams of protein daily.   Exercise goals: As is.  Behavioral modification strategies: increasing lean protein intake and increasing water intake.  Laura Brown has agreed to follow-up with our clinic in 3 to 4 weeks. She was informed of the importance of frequent follow-up visits to maximize her success with intensive lifestyle modifications for her multiple health conditions.   Objective:  Blood pressure 112/69, pulse 87, temperature 97.6 F (36.4 C), temperature source Oral, height 5\' 6"  (1.676 m), weight 264 lb (119.7 kg), SpO2 98 %. Body mass index is 42.61 kg/m.  General: Cooperative, alert, well developed, in no acute distress. HEENT: Conjunctivae and lids unremarkable. Cardiovascular: Regular rhythm.  Lungs: Normal work of  breathing. Neurologic: No focal deficits.   Lab Results  Component Value Date   CREATININE 0.82 07/15/2019   BUN 13 07/15/2019   NA 142 07/15/2019   K 4.7 07/15/2019   CL 105 07/15/2019   CO2 25 07/15/2019   Lab Results  Component Value Date   ALT 10 07/15/2019   AST 13 07/15/2019   ALKPHOS 67 07/15/2019   BILITOT 0.3 07/15/2019   Lab Results  Component Value Date   HGBA1C 4.6 (L) 07/15/2019   HGBA1C 4.6 (L) 03/25/2019   HGBA1C 4.7 (L) 11/26/2018   HGBA1C 4.9 11/07/2017   Lab Results  Component Value Date   INSULIN 8.6 07/15/2019   INSULIN 9.7 03/25/2019   INSULIN 6.1 11/26/2018   INSULIN 6.7 11/07/2017   Lab Results  Component Value Date   TSH 0.984 11/07/2017   Lab Results  Component Value Date   CHOL 165 11/26/2018   HDL 62 11/26/2018   LDLCALC 94 11/26/2018   TRIG 44 11/26/2018   Lab Results  Component Value Date   WBC 6.8 11/07/2017   HGB 14.8 11/07/2017   HCT 43.6 11/07/2017   MCV 92 11/07/2017   PLT 277 03/22/2011   Lab Results  Component Value Date   FERRITIN 32 03/22/2011   Attestation Statements:   Reviewed by clinician on day of visit: allergies, medications, problem list, medical history, surgical history, family history, social history, and previous encounter notes.   03/24/2011, am acting as Trude Mcburney for Energy manager, FNP-C.  I have reviewed the above documentation for accuracy and completeness, and I agree with the above. -  [place], FNP

## 2019-08-28 MED FILL — VERAPAMIL ER PM 100 MG CAP: 100 | 90 days supply | Qty: 180 | Fill #1

## 2019-09-01 MED FILL — SAXENDA 18 MG/3 ML PEN: 18 | 30 days supply | Qty: 15 | Fill #0

## 2019-09-07 ENCOUNTER — Ambulatory Visit (INDEPENDENT_AMBULATORY_CARE_PROVIDER_SITE_OTHER): Payer: No Typology Code available for payment source | Admitting: Family Medicine

## 2019-09-08 MED FILL — NYSTATIN 100,000 UNIT/GM CR: 100000 | 7 days supply | Qty: 15 | Fill #0

## 2019-09-08 MED FILL — CLOBETASOL PROPIONATE 0.05: 0.05 | 14 days supply | Qty: 30 | Fill #0

## 2019-09-11 ENCOUNTER — Other Ambulatory Visit: Payer: Self-pay | Admitting: Family Medicine

## 2019-09-11 DIAGNOSIS — M170 Bilateral primary osteoarthritis of knee: Secondary | ICD-10-CM

## 2019-09-15 ENCOUNTER — Other Ambulatory Visit: Payer: Self-pay

## 2019-09-15 ENCOUNTER — Encounter (INDEPENDENT_AMBULATORY_CARE_PROVIDER_SITE_OTHER): Payer: Self-pay | Admitting: Family Medicine

## 2019-09-15 ENCOUNTER — Ambulatory Visit (INDEPENDENT_AMBULATORY_CARE_PROVIDER_SITE_OTHER): Payer: No Typology Code available for payment source | Admitting: Family Medicine

## 2019-09-15 VITALS — BP 123/77 | HR 87 | Temp 97.4°F | Ht 66.0 in | Wt 262.0 lb

## 2019-09-15 DIAGNOSIS — Z9189 Other specified personal risk factors, not elsewhere classified: Secondary | ICD-10-CM

## 2019-09-15 DIAGNOSIS — F3289 Other specified depressive episodes: Secondary | ICD-10-CM

## 2019-09-15 DIAGNOSIS — E559 Vitamin D deficiency, unspecified: Secondary | ICD-10-CM | POA: Diagnosis not present

## 2019-09-15 DIAGNOSIS — Z6841 Body Mass Index (BMI) 40.0 and over, adult: Secondary | ICD-10-CM

## 2019-09-15 MED ORDER — BUPROPION HCL ER (SR) 200 MG PO TB12: 200.0000 mg | ORAL_TABLET | Freq: Two times a day (BID) | ORAL | 0 refills | Status: DC

## 2019-09-15 MED ORDER — SAXENDA 18 MG/3ML ~~LOC~~ SOPN: 3.0000 mg | PEN_INJECTOR | Freq: Every day | SUBCUTANEOUS | 0 refills | Status: DC

## 2019-09-15 MED ORDER — VITAMIN D (ERGOCALCIFEROL) 1.25 MG (50000 UNIT) PO CAPS: 50000.0000 [IU] | ORAL_CAPSULE | ORAL | 0 refills | Status: DC

## 2019-09-15 MED FILL — VIT D2 1.25 MG (50,000 UNIT: 1.25 MG | 28 days supply | Qty: 4 | Fill #0

## 2019-09-15 MED FILL — BUPROPION HCL SR 200 MG TAB: 200 | 30 days supply | Qty: 60 | Fill #0

## 2019-09-16 ENCOUNTER — Encounter (INDEPENDENT_AMBULATORY_CARE_PROVIDER_SITE_OTHER): Payer: Self-pay | Admitting: Family Medicine

## 2019-09-16 MED ORDER — VITAMIN D 50 MCG (2000 UT) PO CAPS: 1.0000 | ORAL_CAPSULE | Freq: Every day | ORAL | 0 refills | Status: DC

## 2019-09-16 NOTE — Progress Notes (Signed)
Chief Complaint:   OBESITY Laura Brown is here to discuss her progress with her obesity treatment plan along with follow-up of her obesity related diagnoses. Laura Brown is on keeping a food journal and adhering to recommended goals of 1200 calories and 80 grams of protein daily and states she is following her eating plan approximately 70% of the time. Laura Brown states she is doing 0 minutes 0 times per week.  Today's visit was #: 31 Starting weight: 296 lbs Starting date: 11/07/2017 Today's weight: 262 lbs Today's date: 09/15/2019 Total lbs lost to date: 34 Total lbs lost since last in-office visit: 2  Interim History: Laura Brown notes increased dose of Saxenda to 2.4 mg is helping with cravings and hunger. She denies nausea. She notes varies life stressors contribute to stress eating. She is very hard on herself and feels that she should be losing weight more quickly.   Subjective:   1. Vitamin D deficiency Laura Brown's Vit D level is slightly low at 41. She is on prescription Vit D, and she is also taking OTC Vit D 2,000 IU daily. She notes fatigue has improved.  2. Other depression, with emotional eating Laura Brown is now remembering her second dose of bupropion, which is helping with cravings.  3. At risk for osteoporosis Laura Brown is at higher risk of osteopenia and osteoporosis due to Vitamin D deficiency.   Assessment/Plan:   1. Vitamin D deficiency Low Vitamin D level contributes to fatigue and are associated with obesity, breast, and colon cancer. We will refill prescription Vitamin D for 1 month, and she will continue taking OTC Vit D 2,000 IU daily. Laura Brown will follow-up for routine testing of Vitamin D, at least 2-3 times per year to avoid over-replacement.  - Vitamin D, Ergocalciferol, (DRISDOL) 1.25 MG (50000 UNIT) CAPS capsule; Take 1 capsule (50,000 Units total) by mouth every 7 (seven) days.  Dispense: 4 capsule; Refill: 0  2. Other depression, with emotional eating Behavior  modification techniques were discussed today to help Laura Brown deal with her emotional/non-hunger eating behaviors. We will refill bupropion for 1 month. Orders and follow up as documented in patient record.   - buPROPion (WELLBUTRIN SR) 200 MG 12 hr tablet; Take 1 tablet (200 mg total) by mouth 2 (two) times daily.  Dispense: 60 tablet; Refill: 0  3. At risk for osteoporosis Laura Brown was given approximately 15 minutes of osteoporosis prevention counseling today. Laura Brown is at risk for osteopenia and osteoporosis due to her Vitamin D deficiency. She was encouraged to take her Vitamin D and follow her higher calcium diet and increase strengthening exercise to help strengthen her bones and decrease her risk of osteopenia and osteoporosis.  Repetitive spaced learning was employed today to elicit superior memory formation and behavioral change.  4. Class 3 severe obesity with serious comorbidity and body mass index (BMI) of 40.0 to 44.9 in adult, unspecified obesity type (HCC) Laura Brown is currently in the action stage of change. As such, her goal is to continue with weight loss efforts. She has agreed to keeping a food journal and adhering to recommended goals of 1200 calories and 80 grams of protein daily.   We discussed various medication options to help Laura Brown with her weight loss efforts and we both agreed to continue Saxenda, and we will refill for 1 month (she is to take 2.4 mg daily).  - Liraglutide -Weight Management (SAXENDA) 18 MG/3ML SOPN; Inject 0.5 mLs (3 mg total) into the skin daily.  Dispense: 15 mL; Refill: 0  Exercise goals: All adults should avoid inactivity. Some physical activity is better than none, and adults who participate in any amount of physical activity gain some health benefits.  Behavioral modification strategies: increasing lean protein intake, decreasing simple carbohydrates, meal planning and cooking strategies and emotional eating strategies. I provided encouragement and  assured her she is doing well on our program.   Laura Brown has agreed to follow-up with our clinic in 4 weeks. She was informed of the importance of frequent follow-up visits to maximize her success with intensive lifestyle modifications for her multiple health conditions.   Objective:   Blood pressure 123/77, pulse 87, temperature (!) 97.4 F (36.3 C), temperature source Oral, height 5\' 6"  (1.676 m), weight 262 lb (118.8 kg), SpO2 99 %. Body mass index is 42.29 kg/m.  General: Cooperative, alert, well developed, in no acute distress. HEENT: Conjunctivae and lids unremarkable. Cardiovascular: Regular rhythm.  Lungs: Normal work of breathing. Neurologic: No focal deficits.   Lab Results  Component Value Date   CREATININE 0.82 07/15/2019   BUN 13 07/15/2019   NA 142 07/15/2019   K 4.7 07/15/2019   CL 105 07/15/2019   CO2 25 07/15/2019   Lab Results  Component Value Date   ALT 10 07/15/2019   AST 13 07/15/2019   ALKPHOS 67 07/15/2019   BILITOT 0.3 07/15/2019   Lab Results  Component Value Date   HGBA1C 4.6 (L) 07/15/2019   HGBA1C 4.6 (L) 03/25/2019   HGBA1C 4.7 (L) 11/26/2018   HGBA1C 4.9 11/07/2017   Lab Results  Component Value Date   INSULIN 8.6 07/15/2019   INSULIN 9.7 03/25/2019   INSULIN 6.1 11/26/2018   INSULIN 6.7 11/07/2017   Lab Results  Component Value Date   TSH 0.984 11/07/2017   Lab Results  Component Value Date   CHOL 165 11/26/2018   HDL 62 11/26/2018   LDLCALC 94 11/26/2018   TRIG 44 11/26/2018   Lab Results  Component Value Date   WBC 6.8 11/07/2017   HGB 14.8 11/07/2017   HCT 43.6 11/07/2017   MCV 92 11/07/2017   PLT 277 03/22/2011   Lab Results  Component Value Date   FERRITIN 32 03/22/2011   Attestation Statements:   Reviewed by clinician on day of visit: allergies, medications, problem list, medical history, surgical history, family history, social history, and previous encounter notes.   03/24/2011, am acting as  Trude Mcburney for Energy manager, FNP-C.  I have reviewed the above documentation for accuracy and completeness, and I agree with the above. -  [place], FNP

## 2019-09-17 ENCOUNTER — Encounter: Payer: Self-pay | Admitting: Rehabilitative and Restorative Service Providers"

## 2019-09-17 ENCOUNTER — Ambulatory Visit (INDEPENDENT_AMBULATORY_CARE_PROVIDER_SITE_OTHER): Payer: No Typology Code available for payment source | Admitting: Rehabilitative and Restorative Service Providers"

## 2019-09-17 ENCOUNTER — Other Ambulatory Visit: Payer: Self-pay

## 2019-09-17 DIAGNOSIS — R29898 Other symptoms and signs involving the musculoskeletal system: Secondary | ICD-10-CM

## 2019-09-17 DIAGNOSIS — M25562 Pain in left knee: Secondary | ICD-10-CM

## 2019-09-17 DIAGNOSIS — M6281 Muscle weakness (generalized): Secondary | ICD-10-CM

## 2019-09-17 DIAGNOSIS — G8929 Other chronic pain: Secondary | ICD-10-CM

## 2019-09-17 DIAGNOSIS — M25561 Pain in right knee: Secondary | ICD-10-CM | POA: Diagnosis not present

## 2019-09-17 DIAGNOSIS — R293 Abnormal posture: Secondary | ICD-10-CM

## 2019-09-17 NOTE — Therapy (Signed)
[facility] [place] [address] [place], [place], [address] Phone: [phone]   Fax:  [phone]  Physical Therapy Evaluation  Patient Details  Name: Laura Brown MRN: [id number] Date of Birth: Nov 14, 1970 Referring Provider (PT): Dr Maryelizabeth Rowan    Encounter Date: 09/17/2019   PT End of Session - 09/17/19 1819    Visit Number 1    Number of Visits 12    Date for PT Re-Evaluation 10/29/19    PT Start Time 1446    PT Stop Time 1537    PT Time Calculation (min) 51 min    Activity Tolerance Patient tolerated treatment well           Past Medical History:  Diagnosis Date  . Anxiety   . Cluster headaches   . Depression   . Migraines   . Osteoarthritis of both knees     Past Surgical History:  Procedure Laterality Date  . CESAREAN SECTION      There were no vitals filed for this visit.    Subjective Assessment - 09/17/19 1457    Subjective Patient reports pain in bilat knees which has been present for the past 5 years with symtpoms increasing in the past year.    Pertinent History Unremarkable per pt report    Patient Stated Goals be in less pain to postpone TKA a little bit    Currently in Pain? Yes    Pain Score 5     Pain Location Knee    Pain Orientation Right;Left    Pain Descriptors / Indicators Aching;Constant    Pain Type Chronic pain    Pain Radiating Towards can go down calf or into LB    Pain Onset More than a month ago    Pain Frequency Constant    Aggravating Factors  steps; prolonged standing or walking > 15-20 min; squatting; lifting    Pain Relieving Factors topical gel; ice; being off legs; better in the am              [facility] PT Assessment - 09/17/19 0001      Assessment   Medical Diagnosis Bilat knee pain     Referring Provider (PT) Dr Maryelizabeth Rowan     Onset Date/Surgical Date 08/30/18   pain for past 5 years    Hand Dominance Right    Next MD Visit to schedule     Prior  Therapy none       Precautions   Precautions None      Restrictions   Weight Bearing Restrictions No      Balance Screen   Has the patient fallen in the past 6 months No    Has the patient had a decrease in activity level because of a fear of falling?  No    Is the patient reluctant to leave their home because of a fear of falling?  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; working in [employer] in past  Leisure household chores; reading; tv       Observation/Other Assessments   Focus on Therapeutic Outcomes (FOTO)  59% limitation       Sensation   Additional Comments WFL's per pt report       Posture/Postural Control   Posture Comments head forward; forward flexed at hips; knees hyperextended; genu valgus at bilat knees       AROM   Right Knee Extension 0    Right Knee Flexion 114    Left Knee Extension 0    Left Knee Flexion 102    Right Ankle Dorsiflexion 5    Left Ankle Dorsiflexion 0      Strength   Right Hip Flexion 4+/5    Right Hip Extension 4/5    Right Hip ABduction 4+/5    Left Hip Flexion 4/5    Left Hip Extension 4+/5    Left Hip ABduction 4/5    Right Knee Flexion 4+/5    Right Knee Extension 4/5    Left Knee Flexion 4/5    Left Knee Extension 4+/5    Right Ankle Dorsiflexion 4+/5    Right Ankle Plantar Flexion --   5-/5    Left Ankle Dorsiflexion 4+/5    Left Ankle Plantar Flexion 4+/5   pain      Flexibility   Hamstrings ~ 80 deg bilat     Quadriceps Rt 72 deg; Lt 88 deg prone knee flex     ITB tight Rt > Lt     Piriformis tight  Rt > Lt       Palpation   Palpation comment tenderness and pain to palpation medial > Lateral joint line and peripatellar areas Rt > Lt       Balance   Balance Assessed --   SLS Rt 6 sec; L 10 sec min UE support                      Objective measurements completed on examination: See above findings.       [facility] Adult PT Treatment/Exercise - 09/17/19 0001      Knee/Hip Exercises: Stretches   Passive Hamstring Stretch Right;Left;2 reps;30 seconds   supine with strap   Quad Stretch Right;Left;2 reps;30 seconds   prone with strap    ITB Stretch Right;Left;1 rep;30 seconds   supine with strap    Gastroc Stretch Right;Left;3 reps;30 seconds   standing    Soleus Stretch Right;Left;2 reps;30 seconds   standing      Knee/Hip Exercises: Prone   Other Prone Exercises glut set 10 sec x 5 reps       Cryotherapy   Number Minutes Cryotherapy 10 Minutes    Cryotherapy Location Knee   bilat    Type of Cryotherapy Ice pack                  PT Education - 09/17/19 1824    Education Details HEP POC    Person(s) Educated Patient    Methods Explanation;Demonstration;Tactile cues;Verbal cues;Handout    Comprehension Verbalized understanding;Returned demonstration;Verbal cues required;Tactile cues required               PT Long Term Goals - 09/17/19 1825      PT LONG TERM GOAL #1   Title Improve bilat knee flexion for 115-120 deg    Time 6    Period Weeks    Status New    Target Date 10/29/19  PT LONG TERM GOAL #2   Title Increase strength bilat LE's to 4+/5 to 5/5 throughout    Time 6    Period Weeks    Status New    Target Date 10/29/19      PT LONG TERM GOAL #3   Title Increase tissue extensibility with patient to demonstrate increased ROM/mobility bilat LE's    Time 6    Period Weeks    Status New    Target Date 10/29/19      PT LONG TERM GOAL #4   Title Increase walking tolerance to 20-30 min with minimal to no increase in pain    Time 6      Period Weeks    Status New    Target Date 10/29/19      PT LONG TERM GOAL #5   Title Improve FOTO to </= 42% limitation    Time 6    Period Weeks    Status New    Target Date 10/29/19                  Plan - 09/17/19 1819    Clinical Impression Statement Patient presents with ~ 5 year history of bilal knee pain Rt > Lt with symptoms increasing in the past year. She has pain with functional activities and ADL's; decreased balance; limited LE ROM/mobility; decreased LE strength; pain with palpation joint line and peripatellar area. Symtpoms are present bilaterally. She will benefit fro mPT to address problems identified.    Stability/Clinical Decision Making Stable/Uncomplicated    Rehab Potential Good    PT Frequency 2x / week    PT Duration 6 weeks    PT Treatment/Interventions Patient/family education;ADLs/Self Care Home Management;Aquatic Therapy;Cryotherapy;Electrical Stimulation;Iontophoresis 4mg /ml Dexamethasone;Moist Heat;Ultrasound;Gait training;Stair training;Functional mobility training;Therapeutic activities;Therapeutic exercise;Balance training;Neuromuscular re-education;Manual techniques;Dry needling;Taping;Vasopneumatic Device    PT Next Visit Plan review HEP; progress with strengthing and strengthening bialt LE's; modalities as indicated; myofacial release work    PT Home Exercise Plan [email or web address]    Consulted and Agree with Plan of Care Patient           Patient will benefit from skilled therapeutic intervention in order to improve the following deficits and impairments:  Increased fascial restricitons, Decreased activity tolerance, Pain, Decreased balance, Impaired flexibility, Improper body mechanics, Decreased mobility, Decreased strength, Postural dysfunction  Visit Diagnosis: Chronic pain of left knee - Plan: PT plan of care cert/re-cert  Chronic pain of right knee - Plan: PT plan of care cert/re-cert  Muscle weakness (generalized) - Plan: PT plan of  care cert/re-cert  Other symptoms and signs involving the musculoskeletal system - Plan: PT plan of care cert/re-cert  Abnormal posture - Plan: PT plan of care cert/re-cert     Problem List Patient Active Problem List   Diagnosis Date Noted  . Vitamin D deficiency 08/19/2019  . S/P laparoscopic sleeve gastrectomy 08/19/2019  . Depression 07/15/2018  . Class 3 severe obesity with serious comorbidity and body mass index (BMI) of 40.0 to 44.9 in adult (HCC) 07/15/2018  . Primary osteoarthritis of both knees 04/20/2014  . Low back pain 04/20/2014    Anita Laguna 04/22/2014 PT, MPH  09/17/2019, 6:31 PM  [facility] [address] [place], [place], [place] Phone: [phone]   Fax:  [phone]  Name: JERZIE BIERI MRN: Lilli Few Date of Birth: Jun 02, 1970

## 2019-09-17 NOTE — Patient Instructions (Signed)
Access Code: [email or web address]: [email or web address]: 08/19/2021Prepared by: Helyn Schwan HoltExercises  Prone Quadriceps Stretch with Strap - 2 x daily - 7 x weekly - 1 sets - 3 reps - 30 sec hold  Prone Gluteal Sets - 2 x daily - 7 x weekly - 1 sets - 10 reps - 10 sec hold  Hooklying Hamstring Stretch with Strap - 2 x daily - 7 x weekly - 1 sets - 3 reps - 30 sec hold  Supine ITB Stretch with Strap - 2 x daily - 7 x weekly - 1 sets - 3 reps - 30 sec hold  Standing Gastroc Stretch - 2 x daily - 7 x weekly - 1 sets - 3 reps - 30 sec hold  Soleus Stretch on Wall - 2 x daily - 7 x weekly - 1 sets - 3 reps - 30 sec hold

## 2019-09-21 ENCOUNTER — Other Ambulatory Visit: Payer: Self-pay

## 2019-09-21 ENCOUNTER — Ambulatory Visit (INDEPENDENT_AMBULATORY_CARE_PROVIDER_SITE_OTHER): Payer: No Typology Code available for payment source | Admitting: Rehabilitative and Restorative Service Providers"

## 2019-09-21 ENCOUNTER — Encounter: Payer: Self-pay | Admitting: Rehabilitative and Restorative Service Providers"

## 2019-09-21 DIAGNOSIS — M6281 Muscle weakness (generalized): Secondary | ICD-10-CM | POA: Diagnosis not present

## 2019-09-21 DIAGNOSIS — R293 Abnormal posture: Secondary | ICD-10-CM

## 2019-09-21 DIAGNOSIS — R29898 Other symptoms and signs involving the musculoskeletal system: Secondary | ICD-10-CM | POA: Diagnosis not present

## 2019-09-21 DIAGNOSIS — M25562 Pain in left knee: Secondary | ICD-10-CM

## 2019-09-21 DIAGNOSIS — M25561 Pain in right knee: Secondary | ICD-10-CM

## 2019-09-21 DIAGNOSIS — G8929 Other chronic pain: Secondary | ICD-10-CM

## 2019-09-21 NOTE — Patient Instructions (Addendum)
TENS UNIT: This is helpful for muscle pain and spasm.   Search and Purchase a TENS 7000 2nd edition at www.tenspros.com. It should be less than $30.     TENS unit instructions: Do not shower or bathe with the unit on Turn the unit off before removing electrodes or batteries If the electrodes lose stickiness add a drop of water to the electrodes after they are disconnected from the unit and place on plastic sheet. If you continued to have difficulty, call the TENS unit company to purchase more electrodes. Do not apply lotion on the skin area prior to use. Make sure the skin is clean and dry as this will help prolong the life of the electrodes. After use, always check skin for unusual red areas, rash or other skin difficulties. If there are any skin problems, does not apply electrodes to the same area. Never remove the electrodes from the unit by pulling the wires. Do not use the TENS unit or electrodes other than as directed. Do not change electrode placement without consultating your therapist or physician. Keep 2 fingers with between each electrode.             inesiology tape What is kinesiology tape?  There are many brands of kinesiology tape.  KTape, [employer], [employer], Dynamic tape, to name a few. It is an elasticized tape designed to support the body's natural healing process. This tape provides stability and support to muscles and joints without restricting motion. It can also help decrease swelling in the area of application. How does it work? The tape microscopically lifts and decompresses the skin to allow for drainage of lymph (swelling) to flow away from area, reducing inflammation.  The tape has the ability to help re-educate the neuromuscular system by targeting specific receptors in the skin.  The presence of the tape increases the body's awareness of posture and body mechanics.  Do not use with: . Open wounds . Skin lesions . Adhesive allergies Safe  removal of the tape: In some rare cases, mild/moderate skin irritation can occur.  This can include redness, itchiness, or hives. If this occurs, immediately remove tape and consult your primary care physician if symptoms are severe or do not resolve within 2 days.  To remove tape safely, hold nearby skin with one hand and gentle roll tape down with other hand.  You can apply oil or conditioner to tape while in shower prior to removal to loosen adhesive.  DO NOT swiftly rip tape off like a band-aid, as this could cause skin tears and additional skin irritation.    Access Code: [email or web address]: [email or web address]: 08/23/2021Prepared by: Celyn HoltExercises  Prone Quadriceps Stretch with Strap - 2 x daily - 7 x weekly - 1 sets - 3 reps - 30 sec hold  Prone Gluteal Sets - 2 x daily - 7 x weekly - 1 sets - 10 reps - 10 sec hold  Hooklying Hamstring Stretch with Strap - 2 x daily - 7 x weekly - 1 sets - 3 reps - 30 sec hold  Supine ITB Stretch with Strap - 2 x daily - 7 x weekly - 1 sets - 3 reps - 30 sec hold  Standing Gastroc Stretch - 2 x daily - 7 x weekly - 1 sets - 3 reps - 30 sec hold  Soleus Stretch on Wall - 2 x daily - 7 x weekly - 1 sets - 3 reps - 30 sec hold  Supine Quad Set - 2  x daily - 7 x weekly - 1 sets - 10 reps - 3-5 sec hold  Supine Quadricep Sets - 2 x daily - 7 x weekly - 1 sets - 3 reps - 30 sec hold  Small Range Straight Leg Raise - 2 x daily - 7 x weekly - 1 sets - 10 reps - 5 sec hold  Sidelying Hip Abduction - 2 x daily - 7 x weekly - 2-3 sets - 10 reps - 5 sec hold  Hooklying Isometric Clamshell - 2 x daily - 7 x weekly - 1 sets - 10 reps - 3 sec hold

## 2019-09-21 NOTE — Therapy (Signed)
[facility] [place] [address] [place], [place], [address] Phone: [phone]   Fax:  [phone]  Physical Therapy Treatment  Patient Details  Name: Laura Brown MRN: [id number] Date of Birth: 1970-12-06 Referring Provider (PT): Dr Maryelizabeth Rowan    Encounter Date: 09/21/2019   PT End of Session - 09/21/19 0945    Visit Number 2    Number of Visits 12    Date for PT Re-Evaluation 10/29/19    PT Start Time 0934    PT Stop Time 1023    PT Time Calculation (min) 49 min    Activity Tolerance Patient tolerated treatment well           Past Medical History:  Diagnosis Date  . Anxiety   . Cluster headaches   . Depression   . Migraines   . Osteoarthritis of both knees     Past Surgical History:  Procedure Laterality Date  . CESAREAN SECTION      There were no vitals filed for this visit.   Subjective Assessment - 09/21/19 0949    Subjective Doing exercises at home without difficulty. Felt a pop in the Rt knee yesterday and it has been irritated since then.    Currently in Pain? Yes    Pain Score 4     Pain Location Knee    Pain Orientation Right;Left    Pain Descriptors / Indicators Aching;Constant    Pain Type Chronic pain    Pain Onset More than a month ago    Pain Frequency Constant                             [facility] Adult PT Treatment/Exercise - 09/21/19 0001      Knee/Hip Exercises: Stretches   Passive Hamstring Stretch Right;Left;2 reps;30 seconds   supine with strap   Quad Stretch Right;Left;2 reps;30 seconds   prone with strap    ITB Stretch Right;Left;1 rep;30 seconds   supine with strap    Gastroc Stretch Right;Left;3 reps;30 seconds   standing    Soleus Stretch Right;Left;2 reps;30 seconds   standing      Knee/Hip Exercises: Aerobic   Nustep L5 x 5 min       Knee/Hip Exercises: Supine   Quad Sets Strengthening;Both;10 reps   5 sec hold pillow under knees    Straight Leg  Raises Strengthening;Right;Left;10 reps   10 inch lift 5 sec hold    Other Supine Knee/Hip Exercises clam with blue TB alternating LE's x 10 reps       Knee/Hip Exercises: Sidelying   Hip ABduction Strengthening;Right;Left;10 reps   hips fwd leading with heel      Knee/Hip Exercises: Prone   Other Prone Exercises glut set 10 sec x 5 reps       Cryotherapy   Number Minutes Cryotherapy 10 Minutes    Cryotherapy Location Knee   bilat    Type of Cryotherapy Ice pack      Electrical Stimulation   Electrical Stimulation Location 10    Electrical Stimulation Action TENS     Electrical Stimulation Parameters to tolerance     Electrical Stimulation Goals Pain;Edema      Manual Therapy   Kinesiotex Inhibit Muscle      Kinesiotix   Inhibit Muscle  X at medial joint line                   PT  Education - 09/21/19 0957    Education Details kinesotape and HEP    Person(s) Educated Patient    Methods Explanation;Demonstration;Tactile cues;Verbal cues;Handout    Comprehension Verbalized understanding;Returned demonstration;Verbal cues required;Tactile cues required               PT Long Term Goals - 09/17/19 1825      PT LONG TERM GOAL #1   Title Improve bilat knee flexion for 115-120 deg    Time 6    Period Weeks    Status New    Target Date 10/29/19      PT LONG TERM GOAL #2   Title Increase strength bilat LE's to 4+/5 to 5/5 throughout    Time 6    Period Weeks    Status New    Target Date 10/29/19      PT LONG TERM GOAL #3   Title Increase tissue extensibility with patient to demonstrate increased ROM/mobility bilat LE's    Time 6    Period Weeks    Status New    Target Date 10/29/19      PT LONG TERM GOAL #4   Title Increase walking tolerance to 20-30 min with minimal to no increase in pain    Time 6    Period Weeks    Status New    Target Date 10/29/19      PT LONG TERM GOAL #5   Title Improve FOTO to </= 42% limitation    Time 6    Period Weeks     Status New    Target Date 10/29/19                 Plan - 09/21/19 0956    Clinical Impression Statement Reviewed exercise and progressed with strengthening exercises. Trial of kinesotape Rt medial knee to support structures and decrease pain .    Rehab Potential Good    PT Frequency 2x / week    PT Duration 6 weeks    PT Treatment/Interventions Patient/family education;ADLs/Self Care Home Management;Aquatic Therapy;Cryotherapy;Electrical Stimulation;Iontophoresis 4mg /ml Dexamethasone;Moist Heat;Ultrasound;Gait training;Stair training;Functional mobility training;Therapeutic activities;Therapeutic exercise;Balance training;Neuromuscular re-education;Manual techniques;Dry needling;Taping;Vasopneumatic Device    PT Next Visit Plan review HEP; progress with strengthing and strengthening bialt LE's; modalities as indicated; myofacial release work    PT Home Exercise Plan [email or web address]    Consulted and Agree with Plan of Care Patient           Patient will benefit from skilled therapeutic intervention in order to improve the following deficits and impairments:     Visit Diagnosis: Chronic pain of left knee  Chronic pain of right knee  Muscle weakness (generalized)  Other symptoms and signs involving the musculoskeletal system  Abnormal posture     Problem List Patient Active Problem List   Diagnosis Date Noted  . Vitamin D deficiency 08/19/2019  . S/P laparoscopic sleeve gastrectomy 08/19/2019  . Depression 07/15/2018  . Class 3 severe obesity with serious comorbidity and body mass index (BMI) of 40.0 to 44.9 in adult (HCC) 07/15/2018  . Primary osteoarthritis of both knees 04/20/2014  . Low back pain 04/20/2014    Celyn 04/22/2014 PT, MPH  09/21/2019, 10:19 AM  [facility] [address] [place], [place], [place] Phone: [phone]   Fax:  [phone]  Name: Laura Brown MRN: Lilli Few Date of Birth:  01-21-1971

## 2019-09-24 MED FILL — DULOXETINE HCL 60 MG CPEP: 60 | 90 days supply | Qty: 180 | Fill #1

## 2019-09-25 ENCOUNTER — Encounter: Payer: Self-pay | Admitting: Rehabilitative and Restorative Service Providers"

## 2019-09-25 ENCOUNTER — Other Ambulatory Visit: Payer: Self-pay

## 2019-09-25 ENCOUNTER — Ambulatory Visit (INDEPENDENT_AMBULATORY_CARE_PROVIDER_SITE_OTHER): Payer: No Typology Code available for payment source | Admitting: Rehabilitative and Restorative Service Providers"

## 2019-09-25 DIAGNOSIS — R293 Abnormal posture: Secondary | ICD-10-CM

## 2019-09-25 DIAGNOSIS — M6281 Muscle weakness (generalized): Secondary | ICD-10-CM

## 2019-09-25 DIAGNOSIS — M25562 Pain in left knee: Secondary | ICD-10-CM | POA: Diagnosis not present

## 2019-09-25 DIAGNOSIS — M25561 Pain in right knee: Secondary | ICD-10-CM | POA: Diagnosis not present

## 2019-09-25 DIAGNOSIS — G8929 Other chronic pain: Secondary | ICD-10-CM

## 2019-09-25 DIAGNOSIS — R29898 Other symptoms and signs involving the musculoskeletal system: Secondary | ICD-10-CM | POA: Diagnosis not present

## 2019-09-25 MED FILL — SAXENDA 18 MG/3 ML PEN: 18 | 30 days supply | Qty: 15 | Fill #0

## 2019-09-25 NOTE — Patient Instructions (Signed)
Access Code: [email or web address]: [email or web address]: 08/27/2021Prepared by: Lakeya Mulka HoltExercises  Prone Quadriceps Stretch with Strap - 2 x daily - 7 x weekly - 1 sets - 3 reps - 30 sec hold  Prone Gluteal Sets - 2 x daily - 7 x weekly - 1 sets - 10 reps - 10 sec hold  Hooklying Hamstring Stretch with Strap - 2 x daily - 7 x weekly - 1 sets - 3 reps - 30 sec hold  Supine ITB Stretch with Strap - 2 x daily - 7 x weekly - 1 sets - 3 reps - 30 sec hold  Standing Gastroc Stretch - 2 x daily - 7 x weekly - 1 sets - 3 reps - 30 sec hold  Soleus Stretch on Wall - 2 x daily - 7 x weekly - 1 sets - 3 reps - 30 sec hold  Supine Quad Set - 2 x daily - 7 x weekly - 1 sets - 10 reps - 3-5 sec hold  Supine Quadricep Sets - 2 x daily - 7 x weekly - 1 sets - 3 reps - 30 sec hold  Small Range Straight Leg Raise - 2 x daily - 7 x weekly - 1 sets - 10 reps - 5 sec hold  Sidelying Hip Abduction - 2 x daily - 7 x weekly - 2-3 sets - 10 reps - 5 sec hold  Hooklying Isometric Clamshell - 2 x daily - 7 x weekly - 1 sets - 10 reps - 3 sec hold  Straight Leg Raise with External Rotation - 2 x daily - 7 x weekly - 1 sets - 10 reps - 3-5 sec hold  Bridge - 2 x daily - 7 x weekly - 1-2 sets - 10 reps - 10 sec hold  Supine Hip Adduction Isometric with Ball - 2 x daily - 7 x weekly - 1 sets - 10 reps - 10 sec hold  Single Leg Stance - 2 x daily - 7 x weekly - 2 sets - 5 reps - 20 sec hold

## 2019-09-25 NOTE — Therapy (Addendum)
[address] [place], [place], [address] Phone: [phone]   Fax:  [phone]  Physical Therapy Treatment  Patient Details  Name: Laura Brown MRN: [id number] Date of Birth: [birth year] Referring Provider (PT): Dr Rachell Cipro    Encounter Date: 09/25/2019   PT End of Session - 09/25/19 1407    Visit Number 3    Number of Visits 12    Date for PT Re-Evaluation 10/29/19    PT Start Time 3299    PT Stop Time 1455    PT Time Calculation (min) 50 min    Activity Tolerance Patient tolerated treatment well           Past Medical History:  Diagnosis Date  . Anxiety   . Cluster headaches   . Depression   . Migraines   . Osteoarthritis of both knees     Past Surgical History:  Procedure Laterality Date  . CESAREAN SECTION      There were no vitals filed for this visit.   Subjective Assessment - 09/25/19 1408    Subjective Working on her exercises - did miss one day. Wearing her tennis shoes this afternoon. Rt knee is not as irritated like it was earlier in the week. Not sure if the tape helped. She kept it on until the following day.    Currently in Pain? Yes    Pain Score 5     Pain Location Knee    Pain Orientation Right;Left    Pain Descriptors / Indicators Constant    Pain Type Chronic pain                             [facility] Adult PT Treatment/Exercise - 09/25/19 0001      Knee/Hip Exercises: Stretches   Passive Hamstring Stretch Right;Left;2 reps;30 seconds   supine with strap   Quad Stretch Right;Left;2 reps;30 seconds   prone with strap    ITB Stretch Right;Left;1 rep;30 seconds   supine with strap    Gastroc Stretch Right;Left;2 reps;30 seconds   standing    Soleus Stretch Right;Left;2 reps;30 seconds   standing      Knee/Hip Exercises: Aerobic   Nustep L5 x 5 min       Knee/Hip Exercises: Standing   Functional Squat Limitations trial of 1/4 squat increased  bilat knee pain     SLS Rt/Lt 15 sec x 5 reps       Knee/Hip Exercises: Supine   Quad Sets Strengthening;Both;10 reps   5 sec hold pillow under knees    Bridges Strengthening;Both;10 reps   10 sec hold - partial lift    Straight Leg Raises Strengthening;Right;Left;10 reps   10 inch lift 5 sec hold    Straight Leg Raise with External Rotation Strengthening;Right;Left;2 sets;5 reps   10 sec hold    Other Supine Knee/Hip Exercises clam with black TB alternating LE's x 10 reps       Knee/Hip Exercises: Sidelying   Hip ABduction Strengthening;Right;Left;10 reps   hips fwd leading with heel      Knee/Hip Exercises: Prone   Hip Extension Strengthening;Right;Left;5 reps    Other Prone Exercises glut set 10 sec x 5 reps       Cryotherapy   Number Minutes Cryotherapy 10 Minutes    Cryotherapy Location Knee   bilat    Type of Cryotherapy Ice pack      Electrical Stimulation  Product/process development scientist Action TENS    Electrical Stimulation Parameters to tolerance    Electrical Stimulation Goals Pain;Edema                  PT Education - 09/25/19 1451    Education Details HEP    Person(s) Educated Patient    Methods Explanation;Demonstration;Tactile cues;Verbal cues;Handout    Comprehension Verbalized understanding;Returned demonstration;Verbal cues required;Tactile cues required               PT Long Term Goals - 09/17/19 1825      PT LONG TERM GOAL #1   Title Improve bilat knee flexion for 115-120 deg    Time 6    Period Weeks    Status New    Target Date 10/29/19      PT LONG TERM GOAL #2   Title Increase strength bilat LE's to 4+/5 to 5/5 throughout    Time 6    Period Weeks    Status New    Target Date 10/29/19      PT LONG TERM GOAL #3   Title Increase tissue extensibility with patient to demonstrate increased ROM/mobility bilat LE's    Time 6    Period Weeks    Status New    Target Date 10/29/19      PT LONG TERM  GOAL #4   Title Increase walking tolerance to 20-30 min with minimal to no increase in pain    Time 6    Period Weeks    Status New    Target Date 10/29/19      PT LONG TERM GOAL #5   Title Improve FOTO to </= 42% limitation    Time 6    Period Weeks    Status New    Target Date 10/29/19                 Plan - 09/25/19 1410    Clinical Impression Statement Continued work on stretching and strengthening bilat LE's - gradually trying to progress strengthening without increasing knee pain. Patient reports pain with trial of wt bearing exercises in standing. Added supine exercises and SLS    Rehab Potential Good    PT Frequency 2x / week    PT Duration 6 weeks    PT Treatment/Interventions Patient/family education;ADLs/Self Care Home Management;Aquatic Therapy;Cryotherapy;Electrical Stimulation;Iontophoresis 11m/ml Dexamethasone;Moist Heat;Ultrasound;Gait training;Stair training;Functional mobility training;Therapeutic activities;Therapeutic exercise;Balance training;Neuromuscular re-education;Manual techniques;Dry needling;Taping;Vasopneumatic Device    PT Next Visit Plan review HEP; progress with strengthing and strengthening bialt LE's; modalities as indicated; myofacial release work    PT Home Exercise Plan [id number]    Consulted and Agree with Plan of Care Patient           Patient will benefit from skilled therapeutic intervention in order to improve the following deficits and impairments:     Visit Diagnosis: Chronic pain of left knee  Chronic pain of right knee  Muscle weakness (generalized)  Other symptoms and signs involving the musculoskeletal system  Abnormal posture     Problem List Patient Active Problem List   Diagnosis Date Noted  . Vitamin D deficiency 08/19/2019  . S/P laparoscopic sleeve gastrectomy 08/19/2019  . Depression 07/15/2018  . Class 3 severe obesity with serious comorbidity and body mass index (BMI) of 40.0 to 44.9 in adult ([facility]  07/15/2018  . Primary osteoarthritis of both knees 04/20/2014  . Low back pain 04/20/2014    Harrel Ferrone PNilda SimmerPT, MPH  09/25/2019, 2:53 PM  [facility] [place], [place], [address] Phone: [phone]   Fax:  [phone]  Name: LATRESSA HARRIES MRN: [id number] Date of Birth: [birth year]  PHYSICAL THERAPY DISCHARGE SUMMARY  Visits from Start of Care: 3  Current functional level related to goals / functional outcomes: See last progress note for discharge status    Remaining deficits: Persistent pain and difficulty tolerating exercises   Education / Equipment: HEP   Plan: Patient agrees to discharge.  Patient goals were not met. Patient is being discharged due to not returning since the last visit.  ?????     Inetta Dicke P. Helene Kelp PT, MPH 11/06/19 1:39 PM

## 2019-09-28 ENCOUNTER — Encounter: Payer: No Typology Code available for payment source | Admitting: Rehabilitative and Restorative Service Providers"

## 2019-10-01 ENCOUNTER — Encounter: Payer: No Typology Code available for payment source | Admitting: Physical Therapy

## 2019-10-10 ENCOUNTER — Telehealth: Payer: No Typology Code available for payment source | Admitting: Nurse Practitioner

## 2019-10-10 DIAGNOSIS — J309 Allergic rhinitis, unspecified: Secondary | ICD-10-CM | POA: Diagnosis not present

## 2019-10-10 MED ORDER — FEXOFENADINE HCL 180 MG PO TABS: 180.0000 mg | ORAL_TABLET | Freq: Every day | ORAL | 1 refills | Status: DC

## 2019-10-10 MED ORDER — FLUTICASONE PROPIONATE 50 MCG/ACT NA SUSP: 2.0000 | Freq: Every day | NASAL | 6 refills | Status: DC

## 2019-10-10 NOTE — Progress Notes (Signed)
E visit for Allergic Rhinitis We are sorry that you are not feeling well.  Here is how we plan to help!  Based on what you have shared with me it looks like you have Allergic Rhinitis.  Rhinitis is when a reaction occurs that causes nasal congestion, runny nose, sneezing, and itching.  Most types of rhinitis are caused by an inflammation and are associated with symptoms in the eyes ears or throat. There are several types of rhinitis.  The most common are acute rhinitis, which is usually caused by a viral illness, allergic or seasonal rhinitis, and nonallergic or year-round rhinitis.  Nasal allergies occur certain times of the year.  Allergic rhinitis is caused when allergens in the air trigger the release of histamine in the body.  Histamine causes itching, swelling, and fluid to build up in the fragile linings of the nasal passages, sinuses and eyelids.  An itchy nose and clear discharge are common.  I recommend the following: prescription sent to pharmacy Back on allegra 2x a day  I also would recommend a nasal spray: rx sent to pharmacy Flonase 2 sprays into each nostril once daily    HOME CARE:   You can use an over-the-counter saline nasal spray as needed  Avoid areas where there is heavy dust, mites, or molds  Stay indoors on windy days during the pollen season  Keep windows closed in home, at least in bedroom; use air conditioner.  Use high-efficiency house air filter  Keep windows closed in car, turn AC on re-circulate  Avoid playing out with dog during pollen season  GET HELP RIGHT AWAY IF:   If your symptoms do not improve within 10 days  You become short of breath  You develop yellow or green discharge from your nose for over 3 days  You have coughing fits  MAKE SURE YOU:   Understand these instructions  Will watch your condition  Will get help right away if you are not doing well or get worse  Thank you for choosing an e-visit. Your e-visit answers were  reviewed by a board certified advanced clinical practitioner to complete your personal care plan. Depending upon the condition, your plan could have included both over the counter or prescription medications. Please review your pharmacy choice. Be sure that the pharmacy you have chosen is open so that you can pick up your prescription now.  If there is a problem you may message your provider in MyChart to have the prescription routed to another pharmacy. Your safety is important to [place]. If you have drug allergies check your prescription carefully.  For the next 24 hours, you can use MyChart to ask questions about today's visit, request a non-urgent call back, or ask for a work or school excuse from your e-visit provider. You will get an email in the next two days asking about your experience. I hope that your e-visit has been valuable and will speed your recovery.  5-10 minutes spent reviewing and documenting in chart.

## 2019-10-13 ENCOUNTER — Ambulatory Visit (INDEPENDENT_AMBULATORY_CARE_PROVIDER_SITE_OTHER): Payer: No Typology Code available for payment source | Admitting: Family Medicine

## 2019-10-19 MED FILL — OTEZLA 10 & 20 & 30 MG TBPK: 10 & 20 & 3 | 28 days supply | Qty: 55 | Fill #0

## 2019-10-20 ENCOUNTER — Ambulatory Visit (INDEPENDENT_AMBULATORY_CARE_PROVIDER_SITE_OTHER): Payer: No Typology Code available for payment source | Admitting: Family Medicine

## 2019-10-26 ENCOUNTER — Ambulatory Visit (INDEPENDENT_AMBULATORY_CARE_PROVIDER_SITE_OTHER): Payer: No Typology Code available for payment source | Admitting: Family Medicine

## 2019-10-26 ENCOUNTER — Encounter (INDEPENDENT_AMBULATORY_CARE_PROVIDER_SITE_OTHER): Payer: Self-pay | Admitting: Family Medicine

## 2019-10-26 ENCOUNTER — Other Ambulatory Visit: Payer: Self-pay

## 2019-10-26 VITALS — BP 112/72 | HR 87 | Temp 98.2°F | Ht 66.0 in | Wt 266.0 lb

## 2019-10-26 DIAGNOSIS — E559 Vitamin D deficiency, unspecified: Secondary | ICD-10-CM

## 2019-10-26 DIAGNOSIS — Z9189 Other specified personal risk factors, not elsewhere classified: Secondary | ICD-10-CM

## 2019-10-26 DIAGNOSIS — F3289 Other specified depressive episodes: Secondary | ICD-10-CM

## 2019-10-26 DIAGNOSIS — Z6841 Body Mass Index (BMI) 40.0 and over, adult: Secondary | ICD-10-CM

## 2019-10-26 MED ORDER — VITAMIN D (ERGOCALCIFEROL) 1.25 MG (50000 UNIT) PO CAPS: 50000.0000 [IU] | ORAL_CAPSULE | ORAL | 0 refills | Status: DC

## 2019-10-26 MED ORDER — BUPROPION HCL ER (SR) 200 MG PO TB12: 200.0000 mg | ORAL_TABLET | Freq: Two times a day (BID) | ORAL | 0 refills | Status: DC

## 2019-10-26 MED ORDER — SAXENDA 18 MG/3ML ~~LOC~~ SOPN: 3.0000 mg | PEN_INJECTOR | Freq: Every day | SUBCUTANEOUS | 0 refills | Status: DC

## 2019-10-26 MED FILL — SAXENDA 18 MG/3 ML PEN: 18 | 30 days supply | Qty: 15 | Fill #0

## 2019-10-26 MED FILL — VIT D2 1.25 MG (50,000 UNIT: 1.25 MG | 28 days supply | Qty: 4 | Fill #0

## 2019-10-26 MED FILL — BUPROPION HCL SR 200 MG TAB: 200 | 30 days supply | Qty: 60 | Fill #0

## 2019-10-26 NOTE — Progress Notes (Signed)
Chief Complaint:   OBESITY Laura Brown is here to discuss her progress with her obesity treatment plan along with follow-up of her obesity related diagnoses. Laura Brown is on keeping a food journal and adhering to recommended goals of 1200 calories and 80 grams of protein daily and states she is following her eating plan approximately 70% of the time. Laura Brown states she is doing 0 minutes 0 times per week.  Today's visit was #: 32 Starting weight: 296 lbs Starting date: 11/07/2017 Today's weight: 266 lbs Today's date: 10/26/2019 Total lbs lost to date: 30 Total lbs lost since last in-office visit: 0  Interim History: Laura Brown has not journaled over the last month. Her son is bipolar and has not been stable recently, although he is doing better now.  She has tried to be mindful of her eating. She feels journaling is the right plan for her. She feels her protein is averaging at 60 grams per day.  Subjective:   1. Other depression, with emotional eating [name] notes increase in emotional eating due to her son's recent crisis with unstable bipolar disorder. She notes increased wine intake. She is on Cymbalta and bupropion.  2. Vitamin D deficiency [name]'s last Vit D level was low at 41. She is on prescription Vit D.  3. At risk for deficient intake of food The patient is at a higher than average risk of deficient intake of food due to insufficient protein intake.  Assessment/Plan:   1. Other depression, with emotional eating We will refill bupropion for 1 month, and Deklynn will continue Cymbalta. Orders and follow up as documented in patient record.   - buPROPion (WELLBUTRIN SR) 200 MG 12 hr tablet; Take 1 tablet (200 mg total) by mouth 2 (two) times daily.  Dispense: 60 tablet; Refill: 0  2. Vitamin D deficiency Refill prescription Vitamin D for 1 month.   - Vitamin D, Ergocalciferol, (DRISDOL) 1.25 MG (50000 UNIT) CAPS capsule; Take 1 capsule (50,000 Units total) by mouth every 7  (seven) days.  Dispense: 4 capsule; Refill: 0  3. At risk for deficient intake of food Laura Brown was given approximately 15 minutes of deficit intake of food prevention counseling today. Laura Brown is at risk for eating too little protein based on current food recall. She was encouraged to focus on meeting caloric and protein goals according to her recommended meal plan.   4. Class 3 severe obesity with serious comorbidity and body mass index (BMI) of 40.0 to 44.9 in adult, unspecified obesity type (HCC) Kennedey is currently in the action stage of change. As such, her goal is to continue with weight loss efforts. She has agreed to keeping a food journal and adhering to recommended goals of 1200-1400 calories and 80 grams of protein daily.   We discussed various medication options to help Laura Brown with her weight loss efforts and we both agreed to increase Saxenda to 3.0 mg daily, and we will refill for 1 month.  - Liraglutide -Weight Management (SAXENDA) 18 MG/3ML SOPN; Inject 3 mg into the skin daily.  Dispense: 15 mL; Refill: 0  Exercise goals: No exercise has been prescribed at this time.  Behavioral modification strategies: increasing lean protein intake, meal planning and cooking strategies and keeping a strict food journal.  Laura Brown has agreed to follow-up with our clinic in 4 weeks.  Objective:   Blood pressure 112/72, pulse 87, temperature 98.2 F (36.8 C), temperature source Oral, height 5\' 6"  (1.676 m), weight 266 lb (120.7 kg), SpO2 100 %.  Body mass index is 42.93 kg/m.  General: Cooperative, alert, well developed, in no acute distress. HEENT: Conjunctivae and lids unremarkable. Cardiovascular: Regular rhythm.  Lungs: Normal work of breathing. Neurologic: No focal deficits.   Lab Results  Component Value Date   CREATININE 0.82 07/15/2019   BUN 13 07/15/2019   NA 142 07/15/2019   K 4.7 07/15/2019   CL 105 07/15/2019   CO2 25 07/15/2019   Lab Results  Component Value Date    ALT 10 07/15/2019   AST 13 07/15/2019   ALKPHOS 67 07/15/2019   BILITOT 0.3 07/15/2019   Lab Results  Component Value Date   HGBA1C 4.6 (L) 07/15/2019   HGBA1C 4.6 (L) 03/25/2019   HGBA1C 4.7 (L) 11/26/2018   HGBA1C 4.9 11/07/2017   Lab Results  Component Value Date   INSULIN 8.6 07/15/2019   INSULIN 9.7 03/25/2019   INSULIN 6.1 11/26/2018   INSULIN 6.7 11/07/2017   Lab Results  Component Value Date   TSH 0.984 11/07/2017   Lab Results  Component Value Date   CHOL 165 11/26/2018   HDL 62 11/26/2018   LDLCALC 94 11/26/2018   TRIG 44 11/26/2018   Lab Results  Component Value Date   WBC 6.8 11/07/2017   HGB 14.8 11/07/2017   HCT 43.6 11/07/2017   MCV 92 11/07/2017   PLT 277 03/22/2011   Lab Results  Component Value Date   FERRITIN 32 03/22/2011   Attestation Statements:   Reviewed by clinician on day of visit: allergies, medications, problem list, medical history, surgical history, family history, social history, and previous encounter notes.   Trude Mcburney, am acting as Energy manager for [place], FNP-C.  I have reviewed the above documentation for accuracy and completeness, and I agree with the above. -  Jesse Sans, FNP

## 2019-10-27 ENCOUNTER — Encounter (INDEPENDENT_AMBULATORY_CARE_PROVIDER_SITE_OTHER): Payer: Self-pay | Admitting: Family Medicine

## 2019-11-03 ENCOUNTER — Other Ambulatory Visit (HOSPITAL_BASED_OUTPATIENT_CLINIC_OR_DEPARTMENT_OTHER): Payer: Self-pay | Admitting: Gastroenterology

## 2019-11-13 ENCOUNTER — Other Ambulatory Visit: Payer: Self-pay

## 2019-11-13 ENCOUNTER — Ambulatory Visit (HOSPITAL_BASED_OUTPATIENT_CLINIC_OR_DEPARTMENT_OTHER): Payer: No Typology Code available for payment source | Admitting: Pharmacist

## 2019-11-13 DIAGNOSIS — Z79899 Other long term (current) drug therapy: Secondary | ICD-10-CM

## 2019-11-13 MED ORDER — OTEZLA 30 MG PO TABS: 30.0000 mg | ORAL_TABLET | Freq: Two times a day (BID) | ORAL | 0 refills | Status: DC

## 2019-11-13 NOTE — Progress Notes (Signed)
  S: Patient presents for review of their specialty medication therapy.  Patient is currently taking Otezla for psoriasis. Patient is managed by Dr. Duanne Guess for this.   Adherence: confirms   Efficacy: reports that she thinks she can tell a difference early in therapy.   Dosing:  Active psoriatic arthritis or plaque psoriasis (moderate to severe): Oral: 30 mg BID  Current adverse effects: Headache: none  GI upset: none  Weight loss: none  Neuropsychiatric effects: none   O:     Lab Results  Component Value Date   WBC 6.8 11/07/2017   HGB 14.8 11/07/2017   HCT 43.6 11/07/2017   MCV 92 11/07/2017   PLT 277 03/22/2011      Chemistry      Component Value Date/Time   NA 142 07/15/2019 1238   NA 143 03/22/2011 1014   K 4.7 07/15/2019 1238   K 3.9 03/22/2011 1014   CL 105 07/15/2019 1238   CL 107 03/22/2011 1014   CO2 25 07/15/2019 1238   CO2 26 03/22/2011 1014   BUN 13 07/15/2019 1238   BUN 14 03/22/2011 1014   CREATININE 0.82 07/15/2019 1238   CREATININE 0.73 03/22/2011 1014      Component Value Date/Time   CALCIUM 9.3 07/15/2019 1238   CALCIUM 8.7 03/22/2011 1014   ALKPHOS 67 07/15/2019 1238   ALKPHOS 54 03/22/2011 1014   AST 13 07/15/2019 1238   AST 21 03/22/2011 1014   ALT 10 07/15/2019 1238   ALT 15 03/22/2011 1014   BILITOT 0.3 07/15/2019 1238   BILITOT 0.5 03/22/2011 1014       A/P: 1. Medication review: patient is currently on Otezla for psoriasis and is tolerating it well. Reviewed the medication including the following: apremilast inhibits phosphodiesterase 4 (PDE4) specific for cyclic adenosine monophosphate (cAMP) which results in increased intracellular cAMP levels and regulation of numerous inflammatory mediators (eg, decreased expression of nitric oxide synthase, TNF-alpha, and interleukin [IL]-23, as well as increased IL-10. Patient educated on purpose, proper use and potential adverse effects of Otezla. Possible adverse effects include weight  loss, GI upset, headache, and mood changes. Renal function should be routinely monitored. Administer without regard to food. Do not crush, chew, or split tablets. No recommendations for any changes at this time.   Butch Penny, PharmD, CPP Clinical Pharmacist [facility] [phone]

## 2019-11-23 ENCOUNTER — Other Ambulatory Visit: Payer: Self-pay | Admitting: Pharmacist

## 2019-11-23 MED ORDER — OTEZLA 30 MG PO TABS: 30.0000 mg | ORAL_TABLET | Freq: Two times a day (BID) | ORAL | 0 refills | Status: DC

## 2019-11-23 MED FILL — OTEZLA 30 MG TABS: 30 | 30 days supply | Qty: 60 | Fill #0

## 2019-11-24 ENCOUNTER — Encounter (INDEPENDENT_AMBULATORY_CARE_PROVIDER_SITE_OTHER): Payer: Self-pay | Admitting: Family Medicine

## 2019-11-24 ENCOUNTER — Other Ambulatory Visit: Payer: Self-pay

## 2019-11-24 ENCOUNTER — Other Ambulatory Visit (INDEPENDENT_AMBULATORY_CARE_PROVIDER_SITE_OTHER): Payer: Self-pay | Admitting: Family Medicine

## 2019-11-24 ENCOUNTER — Ambulatory Visit (INDEPENDENT_AMBULATORY_CARE_PROVIDER_SITE_OTHER): Payer: No Typology Code available for payment source | Admitting: Family Medicine

## 2019-11-24 VITALS — BP 115/70 | HR 75 | Temp 97.9°F | Ht 66.0 in | Wt 258.0 lb

## 2019-11-24 DIAGNOSIS — Z6841 Body Mass Index (BMI) 40.0 and over, adult: Secondary | ICD-10-CM

## 2019-11-24 DIAGNOSIS — E559 Vitamin D deficiency, unspecified: Secondary | ICD-10-CM | POA: Diagnosis not present

## 2019-11-24 DIAGNOSIS — Z9189 Other specified personal risk factors, not elsewhere classified: Secondary | ICD-10-CM

## 2019-11-24 DIAGNOSIS — F3289 Other specified depressive episodes: Secondary | ICD-10-CM

## 2019-11-24 MED ORDER — BUPROPION HCL ER (SR) 200 MG PO TB12: 200.0000 mg | ORAL_TABLET | Freq: Two times a day (BID) | ORAL | 0 refills | Status: DC

## 2019-11-24 MED ORDER — VITAMIN D (ERGOCALCIFEROL) 1.25 MG (50000 UNIT) PO CAPS: 50000.0000 [IU] | ORAL_CAPSULE | ORAL | 0 refills | Status: DC

## 2019-11-24 MED FILL — BUPROPION HCL SR 200 MG TAB: 200 | 30 days supply | Qty: 60 | Fill #0

## 2019-11-24 MED FILL — VIT D2 1.25 MG (50,000 UNIT: 1.25 MG | 28 days supply | Qty: 4 | Fill #0

## 2019-11-25 ENCOUNTER — Encounter (INDEPENDENT_AMBULATORY_CARE_PROVIDER_SITE_OTHER): Payer: Self-pay | Admitting: Family Medicine

## 2019-11-25 NOTE — Progress Notes (Signed)
Chief Complaint:   OBESITY Laura Brown is here to discuss her progress with her obesity treatment plan along with follow-up of her obesity related diagnoses. Laura Brown is keeping a food journal and adhering to recommended goals of 1200-1400 calories and 80 grams of protein and states she is following her eating plan approximately 85% of the time. Laura Brown states she is walking 20 minutes 3 times per week and doing leg resistance a few times a week.  Today's visit was #: 33 Starting weight: 296 lbs Starting date: 11/07/2017 Today's weight: 258 lbs Today's date: 11/24/2019 Total lbs lost to date: 38 Total lbs lost since last in-office visit: 8  Interim History: Laura Brown has worked on increasing her protein. She is drinking more water when she feels hungry in the evenings. She is on Saxenda 3.0 mg and has mild constipation. She is journaling consistently and meeting her protein goal 90% of the time. She needs bilateral TKR and will have one TKR next summer.  Subjective:   Vitamin D deficiency. Vitamin D is slightly low at 41. Laura Brown is on prescription Vitamin D supplementation.    Ref. Range 07/15/2019 12:38  Vitamin D, 25-Hydroxy Latest Ref Range: 30.0 - 100.0 ng/mL 41.0   Other depression, with emotional eating.  Laura Brown reports mood is stable and stress eating is well controlled. She is currently taking bupropion 200 mg BID and duloxetine 60 mg BID.  At risk for constipation. Laura Brown is at increased risk for constipation due to Saxenda. Laura Brown denies hard, infrequent stools currently.   Assessment/Plan:   Vitamin D deficiency. . She was given a refill on her Vitamin D, Ergocalciferol, (DRISDOL) 1.25 MG (50000 UNIT) CAPS capsule every week #4 with 0 refills.  Other depression, with emotional eating. Laura Brown Kitchen Refill was given for buPROPion (WELLBUTRIN SR) 200 MG 12 hr tablet BID #60 with 0 refills. She will also continue duloxetine.  At risk for constipation. [place]ham was given approximately  15 minutes of counseling today regarding prevention of constipation. She was encouraged to increase water and fiber intake.   Class 3 severe obesity with serious comorbidity and body mass index (BMI) of 40.0 to 44.9 in adult, unspecified obesity type (HCC). Laura Brown will continue Saxenda 3.0 mg as directed.   Laura Brown is currently in the action stage of change. As such, her goal is to continue with weight loss efforts. She has agreed to keeping a food journal and adhering to recommended goals of 1200-1400 calories and 80 grams of protein daily.   Exercise goals: Laura Brown will continue her current exercise regimen and will add arm resistance training 2-3 times per week.  Behavioral modification strategies: increasing water intake and planning for success.  Laura Brown has agreed to follow-up with our clinic in 3-7 weeks.   Objective:   Blood pressure 115/70, pulse 75, temperature 97.9 F (36.6 C), height 5\' 6"  (1.676 m), weight 258 lb (117 kg), SpO2 99 %. Body mass index is 41.64 kg/m.  General: Cooperative, alert, well developed, in no acute distress. HEENT: Conjunctivae and lids unremarkable. Cardiovascular: Regular rhythm.  Lungs: Normal work of breathing. Neurologic: No focal deficits.   Lab Results  Component Value Date   CREATININE 0.82 07/15/2019   BUN 13 07/15/2019   NA 142 07/15/2019   K 4.7 07/15/2019   CL 105 07/15/2019   CO2 25 07/15/2019   Lab Results  Component Value Date   ALT 10 07/15/2019   AST 13 07/15/2019   ALKPHOS 67 07/15/2019   BILITOT  0.3 07/15/2019   Lab Results  Component Value Date   HGBA1C 4.6 (L) 07/15/2019   HGBA1C 4.6 (L) 03/25/2019   HGBA1C 4.7 (L) 11/26/2018   HGBA1C 4.9 11/07/2017   Lab Results  Component Value Date   INSULIN 8.6 07/15/2019   INSULIN 9.7 03/25/2019   INSULIN 6.1 11/26/2018   INSULIN 6.7 11/07/2017   Lab Results  Component Value Date   TSH 0.984 11/07/2017   Lab Results  Component Value Date   CHOL 165 11/26/2018    HDL 62 11/26/2018   LDLCALC 94 11/26/2018   TRIG 44 11/26/2018   Lab Results  Component Value Date   WBC 6.8 11/07/2017   HGB 14.8 11/07/2017   HCT 43.6 11/07/2017   MCV 92 11/07/2017   PLT 277 03/22/2011   Lab Results  Component Value Date   FERRITIN 32 03/22/2011   Attestation Statements:   Reviewed by clinician on day of visit: allergies, medications, problem list, medical history, surgical history, family history, social history, and previous encounter notes.  IMarianna Payment, am acting as Energy manager for [place], FNP-C   I have reviewed the above documentation for accuracy and completeness, and I agree with the above. -  Jesse Sans, FNP

## 2019-11-27 MED FILL — VERAPAMIL ER PM 100 MG CAP: 100 | 90 days supply | Qty: 180 | Fill #2

## 2019-12-15 ENCOUNTER — Other Ambulatory Visit (INDEPENDENT_AMBULATORY_CARE_PROVIDER_SITE_OTHER): Payer: Self-pay | Admitting: Family Medicine

## 2019-12-15 ENCOUNTER — Other Ambulatory Visit: Payer: Self-pay

## 2019-12-15 ENCOUNTER — Ambulatory Visit (INDEPENDENT_AMBULATORY_CARE_PROVIDER_SITE_OTHER): Payer: No Typology Code available for payment source | Admitting: Family Medicine

## 2019-12-15 ENCOUNTER — Encounter (INDEPENDENT_AMBULATORY_CARE_PROVIDER_SITE_OTHER): Payer: Self-pay | Admitting: Family Medicine

## 2019-12-15 VITALS — BP 119/77 | HR 93 | Temp 97.8°F | Ht 66.0 in | Wt 252.0 lb

## 2019-12-15 DIAGNOSIS — E559 Vitamin D deficiency, unspecified: Secondary | ICD-10-CM | POA: Diagnosis not present

## 2019-12-15 DIAGNOSIS — Z6841 Body Mass Index (BMI) 40.0 and over, adult: Secondary | ICD-10-CM | POA: Diagnosis not present

## 2019-12-15 DIAGNOSIS — F3289 Other specified depressive episodes: Secondary | ICD-10-CM

## 2019-12-15 DIAGNOSIS — Z9189 Other specified personal risk factors, not elsewhere classified: Secondary | ICD-10-CM | POA: Diagnosis not present

## 2019-12-15 DIAGNOSIS — E66813 Obesity, class 3: Secondary | ICD-10-CM

## 2019-12-15 MED ORDER — SAXENDA 18 MG/3ML ~~LOC~~ SOPN: 3.0000 mg | PEN_INJECTOR | Freq: Every day | SUBCUTANEOUS | 0 refills | Status: DC

## 2019-12-15 MED ORDER — VITAMIN D (ERGOCALCIFEROL) 1.25 MG (50000 UNIT) PO CAPS: 50000.0000 [IU] | ORAL_CAPSULE | ORAL | 0 refills | Status: DC

## 2019-12-15 MED ORDER — BUPROPION HCL ER (SR) 200 MG PO TB12: 200.0000 mg | ORAL_TABLET | Freq: Two times a day (BID) | ORAL | 0 refills | Status: DC

## 2019-12-15 MED FILL — SAXENDA 18 MG/3 ML PEN: 18 | 30 days supply | Qty: 15 | Fill #0

## 2019-12-16 MED FILL — PEG-3350 SOLUTION: 420 | 1 days supply | Qty: 4000 | Fill #0

## 2019-12-17 NOTE — Progress Notes (Signed)
Chief Complaint:   OBESITY Laura Brown is here to discuss her progress with her obesity treatment plan along with follow-up of her obesity related diagnoses. Laura Brown is on keeping a food journal and adhering to recommended goals of 1200-1400 calories and 80 grams of protein daily and states she is following her eating plan approximately 85% of the time. Laura Brown states she is doing 0 minutes 0 times per week.  Today's visit was #: 34 Starting weight: 296 lbs Starting date: 11/07/2017 Today's weight: 252 lbs Today's date: 12/15/2019 Total lbs lost to date: 44 Total lbs lost since last in-office visit: 6  Interim History: Laura Brown is journaling consistently and meeting her calories and protein goals. She is down 14 lbs since September 2021. She notes Laura Brown is controlling hunger and cravings well at 3.0 mg. She cannot exercise due to bilateral knee pain from osteoarthritis.  Subjective:   1. Other depression, with emotional eating Laura Brown's mood is stable. She is on Cymbalta and bupropion. She feels her cravings are well controlled.  2. Vitamin D deficiency Laura Brown's last Vit D level was low at 41. She is on weekly prescription Vit D.  3. At risk for activity intolerance Laura Brown is at risk for exercise intolerance due to osteoarthritis in both knees. She plans t have a total knee replacement next summer but is not currently established with orthopedics. Her knee pain has worsened considerably recently and keeps her from exercise and normal activities.   Assessment/Plan:   1. Other depression, with emotional eating . We will refill bupropion for 1 month.  - buPROPion (WELLBUTRIN SR) 200 MG 12 hr tablet; Take 1 tablet (200 mg total) by mouth 2 (two) times daily.  Dispense: 60 tablet; Refill: 0  2. Vitamin D deficiency  We will refill prescription Vitamin D for 1 month, and we will recheck labs at her next office visit. - Vitamin D, Ergocalciferol, (DRISDOL) 1.25 MG (50000 UNIT) CAPS  capsule; Take 1 capsule (50,000 Units total) by mouth every 7 (seven) days.  Dispense: 4 capsule; Refill: 0  3. At risk for activity intolerance Laura Brown was given approximately 15 minutes of exercise intolerance counseling today. She is 49 y.o. female and has risk factors exercise intolerance including obesity. We discussed intensive lifestyle modifications today with an emphasis on specific weight loss instructions and strategies.   Laura Brown will call her primary care physician to get a referral for Ortho. She will slowly increase activity as tolerated.  Repetitive spaced learning was employed today to elicit superior memory formation and behavioral change.  4. Class 3 severe obesity with serious comorbidity and body mass index (BMI) of 40.0 to 44.9 in adult, unspecified obesity type (HCC) Laura Brown is currently in the action stage of change. As such, her goal is to continue with weight loss efforts. She has agreed to keeping a food journal and adhering to recommended goals of 1200-1400 calories and 80 grams of protein daily.   Handout was given today: Thanksgiving Tips.  We discussed various medication options to help Laura Brown with her weight loss efforts and we both agreed to continue Saxenda and we will refill for 1 month.  - Liraglutide -Weight Management (SAXENDA) 18 MG/3ML SOPN; Inject 3 mg into the skin daily.  Dispense: 15 mL; Refill: 0  Exercise goals: No exercise has been prescribed at this time.  Behavioral modification strategies: holiday eating strategies  and planning for success.  Laura Brown has agreed to follow-up with our clinic in 3 weeks.  Objective:  Blood pressure 119/77, pulse 93, temperature 97.8 F (36.6 C), height 5\' 6"  (1.676 m), weight 252 lb (114.3 kg), SpO2 100 %. Body mass index is 40.67 kg/m.  General: Cooperative, alert, well developed, in no acute distress. HEENT: Conjunctivae and lids unremarkable. Cardiovascular: Regular rhythm.  Lungs: Normal work of  breathing. Neurologic: No focal deficits.   Lab Results  Component Value Date   CREATININE 0.82 07/15/2019   BUN 13 07/15/2019   NA 142 07/15/2019   K 4.7 07/15/2019   CL 105 07/15/2019   CO2 25 07/15/2019   Lab Results  Component Value Date   ALT 10 07/15/2019   AST 13 07/15/2019   ALKPHOS 67 07/15/2019   BILITOT 0.3 07/15/2019   Lab Results  Component Value Date   HGBA1C 4.6 (L) 07/15/2019   HGBA1C 4.6 (L) 03/25/2019   HGBA1C 4.7 (L) 11/26/2018   HGBA1C 4.9 11/07/2017   Lab Results  Component Value Date   INSULIN 8.6 07/15/2019   INSULIN 9.7 03/25/2019   INSULIN 6.1 11/26/2018   INSULIN 6.7 11/07/2017   Lab Results  Component Value Date   TSH 0.984 11/07/2017   Lab Results  Component Value Date   CHOL 165 11/26/2018   HDL 62 11/26/2018   LDLCALC 94 11/26/2018   TRIG 44 11/26/2018   Lab Results  Component Value Date   WBC 6.8 11/07/2017   HGB 14.8 11/07/2017   HCT 43.6 11/07/2017   MCV 92 11/07/2017   PLT 277 03/22/2011   Lab Results  Component Value Date   FERRITIN 32 03/22/2011   Attestation Statements:   Reviewed by clinician on day of visit: allergies, medications, problem list, medical history, surgical history, family history, social history, and previous encounter notes.   03/24/2011, am acting as Trude Mcburney for Energy manager, FNP-C.  I have reviewed the above documentation for accuracy and completeness, and I agree with the above. -  [place], FNP

## 2019-12-21 ENCOUNTER — Encounter (INDEPENDENT_AMBULATORY_CARE_PROVIDER_SITE_OTHER): Payer: Self-pay | Admitting: Family Medicine

## 2019-12-22 MED FILL — OTEZLA 30 MG TABS: 30 | 30 days supply | Qty: 60 | Fill #1

## 2019-12-29 MED FILL — DULOXETINE HCL 60 MG CPEP: 60 | 90 days supply | Qty: 180 | Fill #2

## 2020-01-05 ENCOUNTER — Ambulatory Visit (INDEPENDENT_AMBULATORY_CARE_PROVIDER_SITE_OTHER): Payer: No Typology Code available for payment source | Admitting: Physician Assistant

## 2020-01-05 ENCOUNTER — Encounter (INDEPENDENT_AMBULATORY_CARE_PROVIDER_SITE_OTHER): Payer: Self-pay | Admitting: Physician Assistant

## 2020-01-05 ENCOUNTER — Other Ambulatory Visit: Payer: Self-pay

## 2020-01-05 VITALS — BP 126/73 | HR 88 | Temp 97.6°F | Ht 66.0 in | Wt 248.0 lb

## 2020-01-05 DIAGNOSIS — E559 Vitamin D deficiency, unspecified: Secondary | ICD-10-CM | POA: Diagnosis not present

## 2020-01-05 DIAGNOSIS — F3289 Other specified depressive episodes: Secondary | ICD-10-CM

## 2020-01-05 DIAGNOSIS — M1711 Unilateral primary osteoarthritis, right knee: Secondary | ICD-10-CM | POA: Diagnosis present

## 2020-01-05 DIAGNOSIS — Z6841 Body Mass Index (BMI) 40.0 and over, adult: Secondary | ICD-10-CM

## 2020-01-05 DIAGNOSIS — Z9189 Other specified personal risk factors, not elsewhere classified: Secondary | ICD-10-CM

## 2020-01-06 ENCOUNTER — Other Ambulatory Visit (INDEPENDENT_AMBULATORY_CARE_PROVIDER_SITE_OTHER): Payer: Self-pay | Admitting: Physician Assistant

## 2020-01-06 MED ORDER — VITAMIN D (ERGOCALCIFEROL) 1.25 MG (50000 UNIT) PO CAPS: 50000.0000 [IU] | ORAL_CAPSULE | ORAL | 0 refills | Status: DC

## 2020-01-06 MED ORDER — SAXENDA 18 MG/3ML ~~LOC~~ SOPN: 3.0000 mg | PEN_INJECTOR | Freq: Every day | SUBCUTANEOUS | 0 refills | Status: DC

## 2020-01-06 MED ORDER — BUPROPION HCL ER (SR) 200 MG PO TB12: 200.0000 mg | ORAL_TABLET | Freq: Two times a day (BID) | ORAL | 0 refills | Status: DC

## 2020-01-06 MED FILL — BUPROPION HCL SR 200 MG TAB: 200 | 30 days supply | Qty: 60 | Fill #0

## 2020-01-06 MED FILL — VIT D2 1.25 MG (50,000 UNIT: 1.25 MG | 56 days supply | Qty: 8 | Fill #0

## 2020-01-06 NOTE — Progress Notes (Signed)
Chief Complaint:   OBESITY Laura Brown is here to discuss her progress with her obesity treatment plan along with follow-up of her obesity related diagnoses. Laura Brown is keeping a food journal and adhering to recommended goals of 1200-1400 calories and 80 grams of protein and states she is following her eating plan approximately 80% of the time. Laura Brown states she is exercising 0 minutes 0 times per week.  Today's visit was #: 35 Starting weight: 296 lbs Starting date: 11/07/2017 Today's weight: 248 lbs Today's date: 01/05/2020 Total lbs lost to date: 48 Total lbs lost since last in-office visit: 4  Interim History: Laura Brown is journaling daily and reports hitting between 60-80 grams of protein and 1200 calories daily. She is having a total knee replacement in 2 weeks.  Subjective:   Vitamin D deficiency. Laura Brown is on prescription Vitamin D supplementation. No nausea, vomiting, or muscle weakness.    Ref. Range 07/15/2019 12:38  Vitamin D, 25-Hydroxy Latest Ref Range: 30.0 - 100.0 ng/mL 41.0   Other depression, with emotional eating. Laura Brown is struggling with emotional eating and using food for comfort to the extent that it is negatively impacting her health. She has been working on behavior modification techniques to help reduce her emotional eating and has been somewhat successful. She shows no sign of suicidal or homicidal ideations. Blood pressure is controlled. Cravings are controlled.  At risk for osteoporosis. Laura Brown is at higher risk of osteopenia and osteoporosis due to Vitamin D deficiency.   Assessment/Plan:   Vitamin D deficiency. Low Vitamin D level contributes to fatigue and are associated with obesity, breast, and colon cancer. She was given a refill on her Vitamin D, Ergocalciferol, (DRISDOL) 1.25 MG (50000 UNIT) CAPS capsule every week #8 with 0 refills and will follow-up for routine testing of Vitamin D, at least 2-3 times per year to avoid over-replacement.    Other depression, with emotional eating. Behavior modification techniques were discussed today to help Laura Brown deal with her emotional/non-hunger eating behaviors.  Orders and follow up as documented in patient record. Refill was given for buPROPion (WELLBUTRIN SR) 200 MG 12 hr tablet #60 with 0 refills.  At risk for osteoporosis. Laura Brown was given approximately 15 minutes of osteoporosis prevention counseling today. Laura Brown is at risk for osteopenia and osteoporosis due to her Vitamin D deficiency. She was encouraged to take her Vitamin D and follow her higher calcium diet and increase strengthening exercise to help strengthen her bones and decrease her risk of osteopenia and osteoporosis.  Repetitive spaced learning was employed today to elicit superior memory formation and behavioral change.  Class 3 severe obesity with serious comorbidity and body mass index (BMI) of 40.0 to 44.9 in adult, unspecified obesity type (HCC). Refill was given for Liraglutide - Weight Management (SAXENDA) 18 MG/3ML SOPN #5 with 0 refills.  Laura Brown is currently in the action stage of change. As such, her goal is to continue with weight loss efforts. She has agreed to keeping a food journal and adhering to recommended goals of 1200-1400 calories and 80 grams of protein daily. She will supplement with protein shakes after surgery as needed.   Exercise goals: For substantial health benefits, adults should do at least 150 minutes (2 hours and 30 minutes) a week of moderate-intensity, or 75 minutes (1 hour and 15 minutes) a week of vigorous-intensity aerobic physical activity, or an equivalent combination of moderate- and vigorous-intensity aerobic activity. Aerobic activity should be performed in episodes of at least 10 minutes,  and preferably, it should be spread throughout the week.  Behavioral modification strategies: increasing lean protein intake and meal planning and cooking strategies.  Laura Brown has agreed to follow-up  with our clinic in 4 weeks. She was informed of the importance of frequent follow-up visits to maximize her success with intensive lifestyle modifications for her multiple health conditions.   Objective:   Blood pressure 126/73, pulse 88, temperature 97.6 F (36.4 C), height 5\' 6"  (1.676 m), weight 248 lb (112.5 kg), SpO2 99 %. Body mass index is 40.03 kg/m.  General: Cooperative, alert, well developed, in no acute distress. HEENT: Conjunctivae and lids unremarkable. Cardiovascular: Regular rhythm.  Lungs: Normal work of breathing. Neurologic: No focal deficits.   Lab Results  Component Value Date   CREATININE 0.82 07/15/2019   BUN 13 07/15/2019   NA 142 07/15/2019   K 4.7 07/15/2019   CL 105 07/15/2019   CO2 25 07/15/2019   Lab Results  Component Value Date   ALT 10 07/15/2019   AST 13 07/15/2019   ALKPHOS 67 07/15/2019   BILITOT 0.3 07/15/2019   Lab Results  Component Value Date   HGBA1C 4.6 (L) 07/15/2019   HGBA1C 4.6 (L) 03/25/2019   HGBA1C 4.7 (L) 11/26/2018   HGBA1C 4.9 11/07/2017   Lab Results  Component Value Date   INSULIN 8.6 07/15/2019   INSULIN 9.7 03/25/2019   INSULIN 6.1 11/26/2018   INSULIN 6.7 11/07/2017   Lab Results  Component Value Date   TSH 0.984 11/07/2017   Lab Results  Component Value Date   CHOL 165 11/26/2018   HDL 62 11/26/2018   LDLCALC 94 11/26/2018   TRIG 44 11/26/2018   Lab Results  Component Value Date   WBC 6.8 11/07/2017   HGB 14.8 11/07/2017   HCT 43.6 11/07/2017   MCV 92 11/07/2017   PLT 277 03/22/2011   Lab Results  Component Value Date   FERRITIN 32 03/22/2011   Attestation Statements:   Reviewed by clinician on day of visit: allergies, medications, problem list, medical history, surgical history, family history, social history, and previous encounter notes.  I02/23/2013, am acting as transcriptionist for Marianna Payment, PA-C   I have reviewed the above documentation for accuracy and completeness, and I  agree with the above. Alois Cliche, PA-C

## 2020-01-08 MED FILL — SAXENDA 18 MG/3 ML PEN: 18 | 30 days supply | Qty: 15 | Fill #0

## 2020-01-11 ENCOUNTER — Encounter (HOSPITAL_COMMUNITY)
Admission: RE | Admit: 2020-01-11 | Discharge: 2020-01-11 | Disposition: A | Discharge status: Home / Self Care | Payer: No Typology Code available for payment source | Source: Ambulatory Visit | Attending: Orthopedic Surgery | Admitting: Orthopedic Surgery

## 2020-01-11 ENCOUNTER — Other Ambulatory Visit: Payer: Self-pay

## 2020-01-11 ENCOUNTER — Encounter (HOSPITAL_COMMUNITY): Payer: Self-pay

## 2020-01-11 DIAGNOSIS — Z01818 Encounter for other preprocedural examination: Secondary | ICD-10-CM | POA: Diagnosis present

## 2020-01-11 LAB — BASIC METABOLIC PANEL
Anion gap: 9 (ref 5–15)
BUN: 9 mg/dL (ref 6–20)
CO2: 26 mmol/L (ref 22–32)
Calcium: 8.9 mg/dL (ref 8.9–10.3)
Chloride: 105 mmol/L (ref 98–111)
Creatinine, Ser: 0.66 mg/dL (ref 0.44–1.00)
GFR, Estimated: >60 mL/min (ref >60)
Glucose, Bld: 84 mg/dL (ref 70–99)
Potassium: 4.3 mmol/L (ref 3.5–5.1)
Sodium: 140 mmol/L (ref 135–145)

## 2020-01-11 LAB — SURGICAL PCR SCREEN
MRSA, PCR: NEGATIVE
Staphylococcus aureus: NEGATIVE

## 2020-01-11 LAB — CBC
HCT: 40.6 % (ref 36.0–46.0)
Hemoglobin: 13 g/dL (ref 12.0–15.0)
MCH: 30.2 pg (ref 26.0–34.0)
MCHC: 32 g/dL (ref 30.0–36.0)
MCV: 94.4 fL (ref 80.0–100.0)
Platelets: 276 10*3/uL (ref 150–400)
RBC: 4.3 MIL/uL (ref 3.87–5.11)
RDW: 13.5 % (ref 11.5–15.5)
WBC: 5.9 10*3/uL (ref 4.0–10.5)
nRBC: 0 % (ref 0.0–0.2)

## 2020-01-11 NOTE — Progress Notes (Signed)
DUE TO COVID-19 ONLY ONE VISITOR IS ALLOWED TO COME WITH YOU AND STAY IN THE WAITING ROOM ONLY DURING PRE OP AND PROCEDURE DAY OF SURGERY. THE 1 VISITOR  MAY VISIT WITH YOU AFTER SURGERY IN YOUR PRIVATE ROOM DURING VISITING HOURS ONLY!  YOU NEED TO HAVE A COVID 19 TEST ON__12/17/2021 _____ @_______ , THIS TEST MUST BE DONE BEFORE SURGERY,  COVID TESTING SITE [address] [place] , IT IS ON THE RIGHT GOING OUT [address] APPROXIMATELY  2 MINUTES PAST [employer] ON THE RIGHT. ONCE YOUR COVID TEST IS COMPLETED,  PLEASE BEGIN THE QUARANTINE INSTRUCTIONS AS OUTLINED IN YOUR HANDOUT.                Laura Brown  01/11/2020   Your procedure is scheduled on: 01/19/2020    Report to [facility] Main  Entrance   Report to admitting at   0530AM     Call this number if you have problems the morning of surgery [phone]    REMEMBER: NO  SOLID FOOD CANDY OR GUM AFTER MIDNIGHT. CLEAR LIQUIDS UNTIL 0430am         . NOTHING BY MOUTH EXCEPT CLEAR LIQUIDS UNTIL    . PLEASE FINISH ENSURE DRINK PER SURGEON ORDER  WHICH NEEDS TO BE COMPLETED AT 0430am      .      CLEAR LIQUID DIET   Foods Allowed                                                                    Coffee and tea, regular and decaf                            Fruit ices (not with fruit pulp)                                      Iced Popsicles                                    Carbonated beverages, regular and diet                                    Cranberry, grape and apple juices Sports drinks like Gatorade Lightly seasoned clear broth or consume(fat free) Sugar, honey syrup ___________________________________________________________________      BRUSH YOUR TEETH MORNING OF SURGERY AND RINSE YOUR MOUTH OUT, NO CHEWING GUM CANDY OR MINTS.     Take these medicines the morning of surgery with A SIP OF WATER: Wellbutrin, Cymbalta, Allegra, Flonase if needed   DO NOT TAKE ANY DIABETIC  MEDICATIONS DAY OF YOUR SURGERY                               You may not have any metal on your body including hair pins and              piercings  Do not wear jewelry, make-up, lotions, powders or perfumes, deodorant             Do not wear nail polish on your fingernails.  Do not shave  48 hours prior to surgery.              Men may shave face and neck.   Do not bring valuables to the hospital. [place].  Contacts, dentures or bridgework may not be worn into surgery.  Leave suitcase in the car. After surgery it may be brought to your room.     Patients discharged the day of surgery will not be allowed to drive home. IF YOU ARE HAVING SURGERY AND GOING HOME THE SAME DAY, YOU MUST HAVE AN ADULT TO DRIVE YOU HOME AND BE WITH YOU FOR 24 HOURS. YOU MAY GO HOME BY TAXI OR UBER OR ORTHERWISE, BUT AN ADULT MUST ACCOMPANY YOU HOME AND STAY WITH YOU FOR 24 HOURS.  Name and phone number of your driver:  Special Instructions: N/A              Please read over the following fact sheets you were given: _____________________________________________________________________  [facility] - Preparing for Surgery Before surgery, you can play an important role.  Because skin is not sterile, your skin needs to be as free of germs as possible.  You can reduce the number of germs on your skin by washing with CHG (chlorahexidine gluconate) soap before surgery.  CHG is an antiseptic cleaner which kills germs and bonds with the skin to continue killing germs even after washing. Please DO NOT use if you have an allergy to CHG or antibacterial soaps.  If your skin becomes reddened/irritated stop using the CHG and inform your nurse when you arrive at Short Stay. Do not shave (including legs and underarms) for at least 48 hours prior to the first CHG shower.  You may shave your face/neck. Please follow these instructions carefully:  1.  Shower with CHG Soap the night  before surgery and the  morning of Surgery.  2.  If you choose to wash your hair, wash your hair first as usual with your  normal  shampoo.  3.  After you shampoo, rinse your hair and body thoroughly to remove the  shampoo.                           4.  Use CHG as you would any other liquid soap.  You can apply chg directly  to the skin and wash                       Gently with a scrungie or clean washcloth.  5.  Apply the CHG Soap to your body ONLY FROM THE NECK DOWN.   Do not use on face/ open                           Wound or open sores. Avoid contact with eyes, ears mouth and genitals (private parts).                       Wash face,  Genitals (private parts) with your normal soap.             6.  Wash  thoroughly, paying special attention to the area where your surgery  will be performed.  7.  Thoroughly rinse your body with warm water from the neck down.  8.  DO NOT shower/wash with your normal soap after using and rinsing off  the CHG Soap.                9.  Pat yourself dry with a clean towel.            10.  Wear clean pajamas.            11.  Place clean sheets on your bed the night of your first shower and do not  sleep with pets. Day of Surgery : Do not apply any lotions/deodorants the morning of surgery.  Please wear clean clothes to the hospital/surgery center.  FAILURE TO FOLLOW THESE INSTRUCTIONS MAY RESULT IN THE CANCELLATION OF YOUR SURGERY PATIENT SIGNATURE_________________________________  NURSE SIGNATURE__________________________________  ________________________________________________________________________

## 2020-01-12 NOTE — Progress Notes (Signed)
Final ekg done 01/11/20- epic  

## 2020-01-15 ENCOUNTER — Other Ambulatory Visit (HOSPITAL_COMMUNITY)
Admission: RE | Admit: 2020-01-15 | Discharge: 2020-01-15 | Disposition: A | Discharge status: Home / Self Care | Payer: No Typology Code available for payment source | Source: Ambulatory Visit | Attending: Gynecologic Oncology | Admitting: Gynecologic Oncology

## 2020-01-15 DIAGNOSIS — Z20822 Contact with and (suspected) exposure to covid-19: Secondary | ICD-10-CM | POA: Insufficient documentation

## 2020-01-15 DIAGNOSIS — Z01812 Encounter for preprocedural laboratory examination: Secondary | ICD-10-CM | POA: Diagnosis present

## 2020-01-16 LAB — SARS CORONAVIRUS 2 (TAT 6-24 HRS): SARS Coronavirus 2: NEGATIVE

## 2020-01-17 ENCOUNTER — Other Ambulatory Visit (HOSPITAL_COMMUNITY): Payer: Self-pay | Admitting: Surgical

## 2020-01-18 NOTE — H&P (Signed)
KNEE ARTHROPLASTY ADMISSION H&P  Patient ID: Laura Brown MRN: [id number] DOB/AGE: 51-Apr-1972 51 y.o.  Chief Complaint: right knee pain.  Planned Procedure Date: 01/19/20 Medical Clearance by Dr. Duanne Guess    HPI: Laura Brown is a 49 y.o. female who presents for evaluation of djd right knee. The patient has a history of pain and functional disability in the right knee due to arthritis and has failed non-surgical conservative treatments for greater than 12 weeks to include NSAID's and/or analgesics, corticosteriod injections, viscosupplementation injections, weight reduction as appropriate and activity modification.  Onset of symptoms was gradual, starting 5 years ago with gradually worsening course since that time. The patient noted no past surgery on the right knee.  Patient currently rates pain at 8 out of 10 with activity. Patient has worsening of pain with activity and weight bearing, pain that interferes with activities of daily living, crepitus and joint swelling.  Patient has evidence of joint space narrowing by imaging studies.  There is no active infection.  Past Medical History:  Diagnosis Date  . Anxiety   . Depression   . Osteoarthritis of both knees    Past Surgical History:  Procedure Laterality Date  . CESAREAN SECTION    . LAPAROSCOPIC GASTRIC SLEEVE RESECTION     No Known Allergies Prior to Admission medications   Medication Sig Start Date End Date Taking? Authorizing Provider  Apremilast (OTEZLA) 30 MG TABS Take 1 tablet (30 mg total) by mouth 2 (two) times daily. Patient taking differently: Take 30 mg by mouth 2 (two) times daily. Used for skin condition per pt 11/23/19  Yes Jegede, Olugbemiga E, MD  Cholecalciferol (VITAMIN D) 50 MCG (2000 UT) CAPS Take 1 capsule (2,000 Units total) by mouth daily. 09/16/19  Yes Whitmire, Dawn W, FNP  DULoxetine (CYMBALTA) 60 MG capsule Take 120 mg by mouth daily.    Yes [provider]  fexofenadine (ALLEGRA) 180 MG  tablet Take 1 tablet (180 mg total) by mouth daily. 10/10/19  Yes Martin, Mary-Margaret, FNP  fluticasone (FLONASE) 50 MCG/ACT nasal spray Place 2 sprays into both nostrils daily. Patient taking differently: Place 2 sprays into both nostrils daily as needed for allergies.  10/10/19  Yes Martin, Mary-Margaret, FNP  verapamil (VERELAN) 100 MG 24 hr capsule Take 200 mg by mouth at bedtime.   Yes [provider]  buPROPion (WELLBUTRIN SR) 200 MG 12 hr tablet Take 1 tablet (200 mg total) by mouth 2 (two) times daily. 01/06/20   Alois Cliche, PA-C  Insulin Pen Needle (BD PEN NEEDLE NANO 2ND GEN) 32G X 4 MM MISC 1 Device by Does not apply route daily. 06/03/19   Quillian Quince D, MD  Liraglutide -Weight Management (SAXENDA) 18 MG/3ML SOPN Inject 3 mg into the skin daily. 01/06/20   Alois Cliche, PA-C  Vitamin D, Ergocalciferol, (DRISDOL) 1.25 MG (50000 UNIT) CAPS capsule Take 1 capsule (50,000 Units total) by mouth every 7 (seven) days. 01/06/20   Alois Cliche, PA-C   Social History   Socioeconomic History  . Marital status: Divorced    Spouse name: Not on file  . Number of children: 2  . Years of education: Not on file  . Highest education level: Not on file  Occupational History  . Occupation: Clinical cytogeneticist: [employer]  Tobacco Use  . Smoking status: Never Smoker  . Smokeless tobacco: Never Used  Vaping Use  . Vaping Use: Never used  Substance and Sexual Activity  .  Alcohol use: Never    Alcohol/week: 0.0 standard drinks  . Drug use: Never  . Sexual activity: Not on file  Other Topics Concern  . Not on file  Social History Narrative  . Not on file   Social Determinants of Health   Financial Resource Strain: Not on file  Food Insecurity: Not on file  Transportation Needs: Not on file  Physical Activity: Not on file  Stress: Not on file  Social Connections: Not on file   Family History  Problem Relation Age of Onset  . Hyperlipidemia  Mother   . Hyperlipidemia Father   . Hypertension Father     ROS: Currently denies lightheadedness, dizziness, Fever, chills, CP, SOB. No personal history of DVT, PE, MI, or CVA. No loose teeth or dentures All other systems have been reviewed and were otherwise currently negative with the exception of those mentioned in the HPI and as above.  Objective: Vitals: Ht: 5'7" Wt: 253 lbs Temp: 97.5 F BP: 110/71 Pulse: 70bpm O2 100% on room air.   Physical Exam: General: Alert, NAD.  Antalgic Gait  HEENT: EOMI, Good Neck Extension Pulm: No increased work of breathing.  Clear B/L A/P w/o crackle or wheeze.  CV: RRR, No m/g/r appreciated  GI: soft, NT, ND Neuro: Neuro without gross focal deficit.  Sensation intact distally Skin: No lesions in the area over right knee MSK/Surgical Site:  knee w/o redness or effusion.  No JLT. ROM 0-95.  5/5 strength in extension and flexion.  +EHL/FHL.  NVI.  Stable varus and valgus stress.    Imaging Review Plain radiographs demonstrate severe degenerative joint disease of the right knee.   Assessment: djd right knee Principal Problem:   Osteoarthritis of right knee   Plan: Plan for Procedure(s): TOTAL KNEE ARTHROPLASTY  The patient history, physical exam, clinical judgement of the provider and imaging are consistent with end stage degenerative joint disease and right knee joint arthroplasty is deemed medically necessary. The treatment options including medical management, injection therapy, and arthroplasty were discussed at length. The risks and benefits of Procedure(s): TOTAL KNEE ARTHROPLASTY were presented and reviewed.  The risks of nonoperative treatment, versus surgical intervention including but not limited to continued pain, aseptic loosening, stiffness, dislocation/subluxation, infection, bleeding, nerve injury, blood clots, cardiopulmonary complications, morbidity, mortality, among others were discussed. The patient verbalizes understanding  and wishes to proceed with the plan.  Patient is being admitted for inpatient treatment for surgery, pain control, PT, prophylactic antibiotics, VTE prophylaxis, progressive ambulation, ADL's and discharge planning.   Dental prophylaxis discussed and recommended for 2 years postoperatively.   The patient does meet the criteria for TXA which will be used perioperatively.    ASA 325 mg  will be used postoperatively for DVT prophylaxis in addition to SCDs, and early ambulation.    The patient is planning to be discharged home with HHPT ([facility]) in care of family   Patient's anticipated LOS is less than 2 midnights, meeting these requirements: - Younger than 63 - Lives within 1 hour of care - Has a competent adult at home to recover with post-op recover - NO history of  - Chronic pain requiring opiods  - Diabetes  - Coronary Artery Disease  - Heart failure  - Heart attack  - Stroke  - DVT/VTE  - Cardiac arrhythmia  - Respiratory Failure/COPD  - Renal failure  - Anemia  - Advanced Liver disease        Armida Sans, PA-C 01/18/2020 5:27 PM

## 2020-01-18 NOTE — Anesthesia Preprocedure Evaluation (Addendum)
Anesthesia Evaluation  Patient identified by MRN, date of birth, ID band Patient awake    Reviewed: Allergy & Precautions, NPO status , Patient's Chart, lab work & pertinent test results  Airway Mallampati: II  TM Distance: >3 FB Neck ROM: Full    Dental  (+) Teeth Intact   Pulmonary neg pulmonary ROS,    Pulmonary exam normal        Cardiovascular negative cardio ROS   Rhythm:Regular Rate:Normal     Neuro/Psych Anxiety Depression negative neurological ROS     GI/Hepatic negative GI ROS, Neg liver ROS,   Endo/Other  negative endocrine ROS  Renal/GU   negative genitourinary   Musculoskeletal  (+) Arthritis , Osteoarthritis,    Abdominal (+)  Abdomen: soft. Bowel sounds: normal.  Peds  Hematology negative hematology ROS (+)   Anesthesia Other Findings   Reproductive/Obstetrics                            Anesthesia Physical Anesthesia Plan  ASA: II  Anesthesia Plan: MAC, Spinal and Regional   Post-op Pain Management:  Regional for Post-op pain   Induction:   PONV Risk Score and Plan: 2 and Ondansetron, Dexamethasone, Propofol infusion and Treatment may vary due to age or medical condition  Airway Management Planned: Simple Face Mask, Natural Airway and Nasal Cannula  Additional Equipment: None  Intra-op Plan:   Post-operative Plan:   Informed Consent: I have reviewed the patients History and Physical, chart, labs and discussed the procedure including the risks, benefits and alternatives for the proposed anesthesia with the patient or authorized representative who has indicated his/her understanding and acceptance.     Dental advisory given  Plan Discussed with: CRNA  Anesthesia Plan Comments: (Lab Results      Component                Value               Date                      WBC                      5.9                 01/11/2020                HGB                       13.0                01/11/2020                HCT                      40.6                01/11/2020                MCV                      94.4                01/11/2020                PLT  276                 01/11/2020          )        Anesthesia Quick Evaluation

## 2020-01-19 ENCOUNTER — Encounter (HOSPITAL_COMMUNITY): Payer: Self-pay | Admitting: Orthopedic Surgery

## 2020-01-19 ENCOUNTER — Ambulatory Visit (HOSPITAL_COMMUNITY): Payer: No Typology Code available for payment source | Admitting: Anesthesiology

## 2020-01-19 ENCOUNTER — Encounter (HOSPITAL_COMMUNITY)
Admission: RE | Disposition: A | Discharge status: Home / Self Care | Payer: Self-pay | Source: Home / Self Care | Attending: Orthopedic Surgery

## 2020-01-19 ENCOUNTER — Other Ambulatory Visit: Payer: Self-pay

## 2020-01-19 ENCOUNTER — Observation Stay (HOSPITAL_COMMUNITY)
Admission: RE | Admit: 2020-01-19 | Discharge: 2020-01-20 | Disposition: A | Discharge status: Home / Self Care | Payer: No Typology Code available for payment source | Attending: Orthopedic Surgery | Admitting: Orthopedic Surgery

## 2020-01-19 ENCOUNTER — Observation Stay (HOSPITAL_COMMUNITY): Payer: No Typology Code available for payment source

## 2020-01-19 DIAGNOSIS — Z79899 Other long term (current) drug therapy: Secondary | ICD-10-CM | POA: Insufficient documentation

## 2020-01-19 DIAGNOSIS — Z96651 Presence of right artificial knee joint: Secondary | ICD-10-CM

## 2020-01-19 DIAGNOSIS — Z794 Long term (current) use of insulin: Secondary | ICD-10-CM | POA: Insufficient documentation

## 2020-01-19 DIAGNOSIS — M1711 Unilateral primary osteoarthritis, right knee: Principal | ICD-10-CM | POA: Insufficient documentation

## 2020-01-19 DIAGNOSIS — M25561 Pain in right knee: Secondary | ICD-10-CM | POA: Diagnosis present

## 2020-01-19 HISTORY — PX: TOTAL KNEE ARTHROPLASTY: SHX125

## 2020-01-19 SURGERY — ARTHROPLASTY, KNEE, TOTAL
Anesthesia: Monitor Anesthesia Care | Site: Knee | Laterality: Right

## 2020-01-19 MED ORDER — BUPROPION HCL ER (SR) 100 MG PO TB12
200.0000 mg | ORAL_TABLET | Freq: Two times a day (BID) | ORAL | Status: DC
  Administered 2020-01-19 – 2020-01-20 (×2): 200 mg via ORAL
  Filled 2020-01-19 (×3): qty 2

## 2020-01-19 MED ORDER — KETOROLAC TROMETHAMINE 30 MG/ML IJ SOLN
INTRAMUSCULAR | Status: DC | PRN
  Administered 2020-01-19: 30 mg

## 2020-01-19 MED ORDER — LACTATED RINGERS IV SOLN: INTRAVENOUS | Status: DC

## 2020-01-19 MED ORDER — ACETAMINOPHEN 325 MG PO TABS: 325.0000 mg | ORAL_TABLET | Freq: Four times a day (QID) | ORAL | Status: DC | PRN

## 2020-01-19 MED ORDER — METOCLOPRAMIDE HCL 5 MG PO TABS: 5.0000 mg | ORAL_TABLET | Freq: Three times a day (TID) | ORAL | Status: DC | PRN

## 2020-01-19 MED ORDER — OXYCODONE HCL 5 MG PO TABS
ORAL_TABLET | ORAL | Status: AC
  Administered 2020-01-19: 5 mg via ORAL
  Filled 2020-01-19: qty 1

## 2020-01-19 MED ORDER — PROPOFOL 1000 MG/100ML IV EMUL
INTRAVENOUS | Status: AC
  Filled 2020-01-19: qty 100

## 2020-01-19 MED ORDER — ROPIVACAINE HCL 7.5 MG/ML IJ SOLN
INTRAMUSCULAR | Status: DC | PRN
  Administered 2020-01-19: 20 mL via PERINEURAL

## 2020-01-19 MED ORDER — BUPIVACAINE-EPINEPHRINE (PF) 0.25% -1:200000 IJ SOLN
INTRAMUSCULAR | Status: DC | PRN
  Administered 2020-01-19: 30 mL via PERINEURAL

## 2020-01-19 MED ORDER — LACTATED RINGERS IV BOLUS
500.0000 mL | Freq: Once | INTRAVENOUS | Status: AC
  Administered 2020-01-19: 500 mL via INTRAVENOUS

## 2020-01-19 MED ORDER — AMISULPRIDE (ANTIEMETIC) 5 MG/2ML IV SOLN: 10.0000 mg | Freq: Once | INTRAVENOUS | Status: DC | PRN

## 2020-01-19 MED ORDER — METOCLOPRAMIDE HCL 5 MG/ML IJ SOLN: 5.0000 mg | Freq: Three times a day (TID) | INTRAMUSCULAR | Status: DC | PRN

## 2020-01-19 MED ORDER — MENTHOL 3 MG MT LOZG: 1.0000 | LOZENGE | OROMUCOSAL | Status: DC | PRN

## 2020-01-19 MED ORDER — LACTATED RINGERS IV BOLUS: 250.0000 mL | Freq: Once | INTRAVENOUS | Status: DC

## 2020-01-19 MED ORDER — PROPOFOL 10 MG/ML IV BOLUS
INTRAVENOUS | Status: DC | PRN
  Administered 2020-01-19: 20 mg via INTRAVENOUS
  Administered 2020-01-19: 30 mg via INTRAVENOUS
  Administered 2020-01-19 (×2): 20 mg via INTRAVENOUS

## 2020-01-19 MED ORDER — APREMILAST 30 MG PO TABS: 30.0000 mg | ORAL_TABLET | Freq: Two times a day (BID) | ORAL | Status: DC

## 2020-01-19 MED ORDER — WATER FOR IRRIGATION, STERILE IR SOLN
Status: DC | PRN
  Administered 2020-01-19: 1000 mL

## 2020-01-19 MED ORDER — METHOCARBAMOL 500 MG PO TABS
500.0000 mg | ORAL_TABLET | Freq: Four times a day (QID) | ORAL | Status: DC | PRN
  Administered 2020-01-19 – 2020-01-20 (×4): 500 mg via ORAL
  Filled 2020-01-19 (×4): qty 1

## 2020-01-19 MED ORDER — POLYETHYLENE GLYCOL 3350 17 G PO PACK: 17.0000 g | PACK | Freq: Every day | ORAL | Status: DC | PRN

## 2020-01-19 MED ORDER — PHENOL 1.4 % MT LIQD: 1.0000 | OROMUCOSAL | Status: DC | PRN

## 2020-01-19 MED ORDER — VERAPAMIL HCL ER 180 MG PO TBCR
180.0000 mg | EXTENDED_RELEASE_TABLET | Freq: Every day | ORAL | Status: DC
  Administered 2020-01-19: 180 mg via ORAL
  Filled 2020-01-19 (×2): qty 1

## 2020-01-19 MED ORDER — BUPIVACAINE-EPINEPHRINE (PF) 0.25% -1:200000 IJ SOLN
INTRAMUSCULAR | Status: AC
  Filled 2020-01-19: qty 30

## 2020-01-19 MED ORDER — MAGNESIUM CITRATE PO SOLN: 1.0000 | Freq: Once | ORAL | Status: DC | PRN

## 2020-01-19 MED ORDER — FENTANYL CITRATE (PF) 100 MCG/2ML IJ SOLN
INTRAMUSCULAR | Status: AC
  Filled 2020-01-19: qty 2

## 2020-01-19 MED ORDER — DIPHENHYDRAMINE HCL 12.5 MG/5ML PO ELIX
12.5000 mg | ORAL_SOLUTION | ORAL | Status: DC | PRN
Start: 2020-01-19 — End: 2020-01-20
  Administered 2020-01-20: 12.5 mg via ORAL
  Filled 2020-01-19: qty 5

## 2020-01-19 MED ORDER — METHOCARBAMOL 500 MG IVPB - SIMPLE MED
INTRAVENOUS | Status: AC
  Filled 2020-01-19: qty 50

## 2020-01-19 MED ORDER — DEXAMETHASONE SODIUM PHOSPHATE 10 MG/ML IJ SOLN
INTRAMUSCULAR | Status: DC | PRN
  Administered 2020-01-19: 5 mg

## 2020-01-19 MED ORDER — BUPIVACAINE IN DEXTROSE 0.75-8.25 % IT SOLN
INTRATHECAL | Status: DC | PRN
  Administered 2020-01-19: 1.8 mL via INTRATHECAL

## 2020-01-19 MED ORDER — VERAPAMIL HCL ER 100 MG PO CP24: 200.0000 mg | ORAL_CAPSULE | Freq: Every day | ORAL | Status: DC

## 2020-01-19 MED ORDER — BISACODYL 10 MG RE SUPP: 10.0000 mg | Freq: Every day | RECTAL | Status: DC | PRN

## 2020-01-19 MED ORDER — METHOCARBAMOL 500 MG IVPB - SIMPLE MED
500.0000 mg | Freq: Four times a day (QID) | INTRAVENOUS | Status: DC | PRN
  Administered 2020-01-19: 500 mg via INTRAVENOUS
  Filled 2020-01-19: qty 50

## 2020-01-19 MED ORDER — TRANEXAMIC ACID-NACL 1000-0.7 MG/100ML-% IV SOLN
INTRAVENOUS | Status: AC
  Administered 2020-01-19: 1000 mg
  Filled 2020-01-19: qty 100

## 2020-01-19 MED ORDER — POVIDONE-IODINE 10 % EX SWAB
2.0000 "application " | Freq: Once | CUTANEOUS | Status: AC
  Administered 2020-01-19: 2 via TOPICAL

## 2020-01-19 MED ORDER — HYDROMORPHONE HCL 1 MG/ML IJ SOLN
INTRAMUSCULAR | Status: AC
  Filled 2020-01-19: qty 1

## 2020-01-19 MED ORDER — ONDANSETRON HCL 4 MG/2ML IJ SOLN: 4.0000 mg | Freq: Four times a day (QID) | INTRAMUSCULAR | Status: DC | PRN

## 2020-01-19 MED ORDER — LORATADINE 10 MG PO TABS
10.0000 mg | ORAL_TABLET | Freq: Every day | ORAL | Status: DC
  Administered 2020-01-19 – 2020-01-20 (×2): 10 mg via ORAL
  Filled 2020-01-19 (×2): qty 1

## 2020-01-19 MED ORDER — ACETAMINOPHEN 500 MG PO TABS
1000.0000 mg | ORAL_TABLET | Freq: Once | ORAL | Status: AC
  Administered 2020-01-19: 1000 mg via ORAL
  Filled 2020-01-19: qty 2

## 2020-01-19 MED ORDER — DOCUSATE SODIUM 100 MG PO CAPS
100.0000 mg | ORAL_CAPSULE | Freq: Two times a day (BID) | ORAL | Status: DC
  Administered 2020-01-19 – 2020-01-20 (×2): 100 mg via ORAL
  Filled 2020-01-19 (×2): qty 1

## 2020-01-19 MED ORDER — OXYCODONE HCL 5 MG PO TABS
10.0000 mg | ORAL_TABLET | ORAL | Status: DC | PRN
  Administered 2020-01-20 (×4): 15 mg via ORAL
  Filled 2020-01-19 (×4): qty 3

## 2020-01-19 MED ORDER — DEXAMETHASONE SODIUM PHOSPHATE 10 MG/ML IJ SOLN
INTRAMUSCULAR | Status: DC | PRN
  Administered 2020-01-19: 10 mg via INTRAVENOUS

## 2020-01-19 MED ORDER — PROPOFOL 500 MG/50ML IV EMUL
INTRAVENOUS | Status: DC | PRN
  Administered 2020-01-19: 100 ug/kg/min via INTRAVENOUS

## 2020-01-19 MED ORDER — FENTANYL CITRATE (PF) 100 MCG/2ML IJ SOLN
INTRAMUSCULAR | Status: DC | PRN
  Administered 2020-01-19: 100 ug via INTRAVENOUS
  Administered 2020-01-19 (×2): 50 ug via INTRAVENOUS

## 2020-01-19 MED ORDER — CEFAZOLIN SODIUM-DEXTROSE 2-4 GM/100ML-% IV SOLN
2.0000 g | INTRAVENOUS | Status: AC
  Administered 2020-01-19: 2 g via INTRAVENOUS
  Filled 2020-01-19: qty 100

## 2020-01-19 MED ORDER — TRANEXAMIC ACID-NACL 1000-0.7 MG/100ML-% IV SOLN
1000.0000 mg | Freq: Once | INTRAVENOUS | Status: AC
  Administered 2020-01-19: 1000 mg via INTRAVENOUS
  Filled 2020-01-19: qty 100

## 2020-01-19 MED ORDER — POTASSIUM CHLORIDE IN NACL 20-0.45 MEQ/L-% IV SOLN
INTRAVENOUS | Status: DC
  Filled 2020-01-19 (×2): qty 1000

## 2020-01-19 MED ORDER — KETOROLAC TROMETHAMINE 30 MG/ML IJ SOLN
INTRAMUSCULAR | Status: AC
  Filled 2020-01-19: qty 1

## 2020-01-19 MED ORDER — PROMETHAZINE HCL 25 MG/ML IJ SOLN: 6.2500 mg | INTRAMUSCULAR | Status: DC | PRN

## 2020-01-19 MED ORDER — OXYCODONE HCL 5 MG/5ML PO SOLN: 5.0000 mg | Freq: Once | ORAL | Status: AC | PRN

## 2020-01-19 MED ORDER — DEXAMETHASONE SODIUM PHOSPHATE 10 MG/ML IJ SOLN
INTRAMUSCULAR | Status: AC
  Filled 2020-01-19: qty 1

## 2020-01-19 MED ORDER — ONDANSETRON HCL 4 MG PO TABS: 4.0000 mg | ORAL_TABLET | Freq: Four times a day (QID) | ORAL | Status: DC | PRN

## 2020-01-19 MED ORDER — CHLORHEXIDINE GLUCONATE 0.12 % MT SOLN
15.0000 mL | Freq: Once | OROMUCOSAL | Status: AC
  Administered 2020-01-19: 15 mL via OROMUCOSAL

## 2020-01-19 MED ORDER — TRANEXAMIC ACID-NACL 1000-0.7 MG/100ML-% IV SOLN
1000.0000 mg | INTRAVENOUS | Status: AC
  Administered 2020-01-19: 1000 mg via INTRAVENOUS
  Filled 2020-01-19: qty 100

## 2020-01-19 MED ORDER — MIDAZOLAM HCL 2 MG/2ML IJ SOLN
INTRAMUSCULAR | Status: AC
  Filled 2020-01-19: qty 2

## 2020-01-19 MED ORDER — 0.9 % SODIUM CHLORIDE (POUR BTL) OPTIME
TOPICAL | Status: DC | PRN
  Administered 2020-01-19: 1000 mL

## 2020-01-19 MED ORDER — DEXAMETHASONE SODIUM PHOSPHATE 10 MG/ML IJ SOLN
10.0000 mg | Freq: Once | INTRAMUSCULAR | Status: AC
  Administered 2020-01-20: 10 mg via INTRAVENOUS
  Filled 2020-01-19: qty 1

## 2020-01-19 MED ORDER — HYDROMORPHONE HCL 1 MG/ML IJ SOLN
0.2500 mg | INTRAMUSCULAR | Status: DC | PRN
Start: 2020-01-19 — End: 2020-01-19
  Administered 2020-01-19 (×4): 0.5 mg via INTRAVENOUS

## 2020-01-19 MED ORDER — ONDANSETRON HCL 4 MG/2ML IJ SOLN
INTRAMUSCULAR | Status: AC
  Filled 2020-01-19: qty 2

## 2020-01-19 MED ORDER — ALUM & MAG HYDROXIDE-SIMETH 200-200-20 MG/5ML PO SUSP: 30.0000 mL | ORAL | Status: DC | PRN

## 2020-01-19 MED ORDER — OXYCODONE HCL 5 MG PO TABS
5.0000 mg | ORAL_TABLET | Freq: Once | ORAL | Status: AC | PRN
  Administered 2020-01-19: 5 mg via ORAL

## 2020-01-19 MED ORDER — ACETAMINOPHEN 500 MG PO TABS
1000.0000 mg | ORAL_TABLET | Freq: Four times a day (QID) | ORAL | Status: AC
  Administered 2020-01-19 – 2020-01-20 (×4): 1000 mg via ORAL
  Filled 2020-01-19 (×4): qty 2

## 2020-01-19 MED ORDER — VITAMIN D 25 MCG (1000 UNIT) PO TABS
2000.0000 [IU] | ORAL_TABLET | Freq: Every day | ORAL | Status: DC
  Administered 2020-01-20: 2000 [IU] via ORAL
  Filled 2020-01-19: qty 2

## 2020-01-19 MED ORDER — HYDROMORPHONE HCL 1 MG/ML IJ SOLN
0.5000 mg | INTRAMUSCULAR | Status: DC | PRN
  Administered 2020-01-20: 1 mg via INTRAVENOUS
  Filled 2020-01-19: qty 1

## 2020-01-19 MED ORDER — DULOXETINE HCL 60 MG PO CPEP
120.0000 mg | ORAL_CAPSULE | Freq: Every day | ORAL | Status: DC
  Administered 2020-01-20: 120 mg via ORAL
  Filled 2020-01-19: qty 2

## 2020-01-19 MED ORDER — ORAL CARE MOUTH RINSE: 15.0000 mL | Freq: Once | OROMUCOSAL | Status: AC

## 2020-01-19 MED ORDER — ONDANSETRON HCL 4 MG/2ML IJ SOLN
INTRAMUSCULAR | Status: DC | PRN
  Administered 2020-01-19: 4 mg via INTRAVENOUS

## 2020-01-19 MED ORDER — ASPIRIN EC 325 MG PO TBEC
325.0000 mg | DELAYED_RELEASE_TABLET | Freq: Every day | ORAL | Status: DC
  Administered 2020-01-20: 325 mg via ORAL
  Filled 2020-01-19: qty 1

## 2020-01-19 MED ORDER — CEFAZOLIN SODIUM-DEXTROSE 2-4 GM/100ML-% IV SOLN
2.0000 g | Freq: Four times a day (QID) | INTRAVENOUS | Status: AC
  Administered 2020-01-19 (×2): 2 g via INTRAVENOUS
  Filled 2020-01-19 (×2): qty 100

## 2020-01-19 MED ORDER — LIRAGLUTIDE -WEIGHT MANAGEMENT 18 MG/3ML ~~LOC~~ SOPN: 3.0000 mg | PEN_INJECTOR | Freq: Every day | SUBCUTANEOUS | Status: DC

## 2020-01-19 MED ORDER — SODIUM CHLORIDE 0.9 % IR SOLN
Status: DC | PRN
  Administered 2020-01-19: 1000 mL

## 2020-01-19 MED ORDER — OXYCODONE HCL 5 MG PO TABS
5.0000 mg | ORAL_TABLET | ORAL | Status: DC | PRN
Start: 2020-01-19 — End: 2020-01-20
  Administered 2020-01-19: 10 mg via ORAL
  Administered 2020-01-19: 5 mg via ORAL
  Administered 2020-01-19: 10 mg via ORAL
  Filled 2020-01-19 (×2): qty 1
  Filled 2020-01-19 (×2): qty 2

## 2020-01-19 MED ORDER — MIDAZOLAM HCL 5 MG/5ML IJ SOLN
INTRAMUSCULAR | Status: DC | PRN
  Administered 2020-01-19: 2 mg via INTRAVENOUS

## 2020-01-19 MED FILL — OTEZLA 30 MG TABS: 30 | 30 days supply | Qty: 60 | Fill #2

## 2020-01-19 SURGICAL SUPPLY — 58 items
ATTUNE MED DOME PAT 38 KNEE (Knees) ×2 IMPLANT
ATTUNE PS FEM RT SZ 5 CEM KNEE (Femur) ×2 IMPLANT
BAG SPEC THK2 15X12 ZIP CLS (MISCELLANEOUS)
BAG ZIPLOCK 12X15 (MISCELLANEOUS) IMPLANT
BASEPLATE TIB CMT FB PCKT SZ5 (Knees) ×2 IMPLANT
BLADE SAG 18X100X1.27 (BLADE) ×4 IMPLANT
BLADE SURG 15 STRL LF DISP TIS (BLADE) ×1 IMPLANT
BLADE SURG 15 STRL SS (BLADE) ×2
BNDG CMPR MED 10X6 ELC LF (GAUZE/BANDAGES/DRESSINGS) ×1
BNDG CMPR MED 15X6 ELC VLCR LF (GAUZE/BANDAGES/DRESSINGS) ×1
BNDG ELASTIC 6X10 VLCR STRL LF (GAUZE/BANDAGES/DRESSINGS) ×2 IMPLANT
BNDG ELASTIC 6X15 VLCR STRL LF (GAUZE/BANDAGES/DRESSINGS) ×1 IMPLANT
BOWL SMART MIX CTS (DISPOSABLE) ×2 IMPLANT
BSPLAT TIB 5 CMNT FXBRNG STRL (Knees) ×1 IMPLANT
CEMENT BONE R 1X40 (Cement) ×4 IMPLANT
CLSR STERI-STRIP ANTIMIC 1/2X4 (GAUZE/BANDAGES/DRESSINGS) ×2 IMPLANT
COVER SURGICAL LIGHT HANDLE (MISCELLANEOUS) ×2 IMPLANT
COVER WAND RF STERILE (DRAPES) IMPLANT
CUFF TOURN SGL QUICK 34 (TOURNIQUET CUFF) ×2
CUFF TRNQT CYL 34X4.125X (TOURNIQUET CUFF) ×1 IMPLANT
DECANTER SPIKE VIAL GLASS SM (MISCELLANEOUS) IMPLANT
DRAPE ORTHO SPLIT 77X108 STRL (DRAPES)
DRAPE SURG ORHT 6 SPLT 77X108 (DRAPES) IMPLANT
DRAPE U-SHAPE 47X51 STRL (DRAPES) ×2 IMPLANT
DRSG MEPILEX BORDER 4X8 (GAUZE/BANDAGES/DRESSINGS) ×2 IMPLANT
DRSG PAD ABDOMINAL 8X10 ST (GAUZE/BANDAGES/DRESSINGS) ×2 IMPLANT
DURAPREP 26ML APPLICATOR (WOUND CARE) ×4 IMPLANT
ELECT REM PT RETURN 15FT ADLT (MISCELLANEOUS) ×2 IMPLANT
GLOVE BIO SURGEON STRL SZ7 (GLOVE) ×2 IMPLANT
GLOVE BIO SURGEON STRL SZ7.5 (GLOVE) ×2 IMPLANT
GLOVE BIOGEL PI IND STRL 7.0 (GLOVE) ×1 IMPLANT
GLOVE BIOGEL PI IND STRL 8 (GLOVE) ×1 IMPLANT
GLOVE BIOGEL PI INDICATOR 7.0 (GLOVE) ×1
GLOVE BIOGEL PI INDICATOR 8 (GLOVE) ×1
GOWN STRL REUS W/TWL LRG LVL3 (GOWN DISPOSABLE) ×4 IMPLANT
HANDPIECE INTERPULSE COAX TIP (DISPOSABLE) ×2
HOLDER FOLEY CATH W/STRAP (MISCELLANEOUS) IMPLANT
HOOD PEEL AWAY FLYTE STAYCOOL (MISCELLANEOUS) ×6 IMPLANT
IMMOBILIZER KNEE 20 (SOFTGOODS) ×2
IMMOBILIZER KNEE 20 THIGH 36 (SOFTGOODS) ×1 IMPLANT
INSERT TIB FIX BEARNG SZ 5 5MM (Insert) ×1 IMPLANT
KIT TURNOVER KIT A (KITS) IMPLANT
MANIFOLD NEPTUNE II (INSTRUMENTS) ×2 IMPLANT
NS IRRIG 1000ML POUR BTL (IV SOLUTION) ×2 IMPLANT
PACK ICE MAXI GEL EZY WRAP (MISCELLANEOUS) ×2 IMPLANT
PACK TOTAL KNEE CUSTOM (KITS) ×2 IMPLANT
PENCIL SMOKE EVACUATOR (MISCELLANEOUS) IMPLANT
PIN DRILL FIX HALF THREAD (BIT) ×2 IMPLANT
PIN STEINMAN FIXATION KNEE (PIN) ×2 IMPLANT
PROTECTOR NERVE ULNAR (MISCELLANEOUS) ×2 IMPLANT
SET HNDPC FAN SPRY TIP SCT (DISPOSABLE) ×1 IMPLANT
SUT VIC AB 1 CT1 36 (SUTURE) ×4 IMPLANT
SUT VIC AB 2-0 CT1 27 (SUTURE) ×2
SUT VIC AB 2-0 CT1 TAPERPNT 27 (SUTURE) ×1 IMPLANT
SUT VIC AB 3-0 SH 8-18 (SUTURE) ×2 IMPLANT
TRAY FOLEY MTR SLVR 16FR STAT (SET/KITS/TRAYS/PACK) ×2 IMPLANT
WATER STERILE IRR 1000ML POUR (IV SOLUTION) ×4 IMPLANT
WRAP KNEE MAXI GEL POST OP (GAUZE/BANDAGES/DRESSINGS) ×2 IMPLANT

## 2020-01-19 NOTE — Plan of Care (Signed)
Patient settled onto department; assessed and given pain medicine. No issues at this time and will continue to monitor.

## 2020-01-19 NOTE — Interval H&P Note (Signed)
History and Physical Interval Note:  01/19/2020 7:19 AM  Laura Brown  has presented today for surgery, with the diagnosis of djd right knee.  The various methods of treatment have been discussed with the patient and family. After consideration of risks, benefits and other options for treatment, the patient has consented to  Procedure(s): TOTAL KNEE ARTHROPLASTY (Right) as a surgical intervention.  The patient's history has been reviewed, patient examined, no change in status, stable for surgery.  I have reviewed the patient's chart and labs.  Questions were answered to the patient's satisfaction.     Eulas Post

## 2020-01-19 NOTE — Op Note (Addendum)
DATE OF SURGERY:  01/19/2020 TIME: 9:44 AM  PATIENT NAME:  Laura Brown   AGE: 49 y.o.    PRE-OPERATIVE DIAGNOSIS: Right knee valgus primary localized osteoarthritis  POST-OPERATIVE DIAGNOSIS:  Same  PROCEDURE: RIGHT total Knee Arthroplasty  SURGEON:  Eulas Post, MD   ASSISTANT:  Janine Ores, PA-C, present and scrubbed throughout the case, critical for assistance with exposure, retraction, instrumentation, and closure.   OPERATIVE IMPLANTS: DePuy attune size 5 femur, 5 tibia, posterior stabilized, 5 mm polyethylene insert with a 38 mm patellar dome medialized.   PREOPERATIVE INDICATIONS:  SAMANTHAN Brown is a 49 y.o. year old female with end stage bone on bone degenerative arthritis of the knee who failed conservative treatment, including injections, antiinflammatories, activity modification, and assistive devices, and had significant impairment of their activities of daily living, and elected for Total Knee Arthroplasty.   The risks, benefits, and alternatives were discussed at length including but not limited to the risks of infection, bleeding, nerve injury, stiffness, blood clots, the need for revision surgery, cardiopulmonary complications, among others, and they were willing to proceed.  OPERATIVE FINDINGS AND UNIQUE ASPECTS OF THE CASE: She had substantial valgus.  There was also a fair amount of lateral femoral hypoplasia.  My distal femoral cut was fairly thin, but did have just a small portion of femoral bone removed.  There was a lot of eburnation on the lateral side.  She reached full extension with the tibial cut, set at 2 mm off of the lateral side at the deepest level of eburnation.  I cut the femur on 5 degrees of external rotation to match whiteside's line.  ESTIMATED BLOOD LOSS: 150 mL  OPERATIVE DESCRIPTION:  The patient was brought to the operative room and placed in a supine position.  Anesthesia was administered.  IV antibiotics were given.  The lower  extremity was prepped and draped in the usual sterile fashion.  Time out was performed.  The leg was elevated and exsanguinated and the tourniquet was inflated.  Anterior quadriceps tendon splitting approach was performed.  The patella was everted and osteophytes were removed.  The anterior horn of the medial and lateral meniscus was removed.   The [facility] leg holder was utilized.  The patella was exposed, and cut, measuring 20 mm before the cut, and 14 mm afterwards.  The distal femur was opened with the drill and the intramedullary distal femoral cutting jig was utilized, set at 5 degrees resecting 9 mm off the distal femur.  Care was taken to protect the collateral ligaments.  Then the extramedullary tibial cutting jig was utilized making the appropriate cut using the anterior tibial crest as a reference building in appropriate posterior slope.  Care was taken during the cut to protect the medial and collateral ligaments.  The proximal tibia was removed along with the posterior horns of the menisci.  The PCL was sacrificed.    The extensor gap was measured and accommodated a 5 mm insert..    The distal femoral sizing jig was applied, taking care to avoid notching.  Then the 4-in-1 cutting jig was applied and the anterior and posterior femur was cut, along with the chamfer cuts at 5 degrees external rotation referenced off the posterior condyles.  All posterior osteophytes were removed.  The flexion gap was then measured and was symmetric with the extension gap.  She may have had slight increased gap in flexion, but felt appropriately stabilized.  I completed the distal femoral preparation using  the appropriate jig to prepare the box.  The proximal tibia sized and prepared accordingly with the reamer and the punch, and then all components were trialed with the 42mm poly insert.  The knee was found to have excellent balance and full motion.    The above named components were then cemented into place  and all excess cement was removed.  The real polyethylene implant was placed.  After the cement had cured I released the tourniquet and confirmed excellent hemostasis with no major posterior vessel injury.    The knee was easily taken through a range of motion and the patella tracked well and the knee irrigated copiously and the parapatellar and subcutaneous tissue closed with vicryl, and monocryl with steri strips for the skin.  The wounds were injected with marcaine, and dressed with sterile gauze and the patient was awakened and returned to the PACU in stable and satisfactory condition.  There were no complications.  Total tourniquet time was approximately 80 minutes.

## 2020-01-19 NOTE — Anesthesia Postprocedure Evaluation (Signed)
Anesthesia Post Note  Patient: Laura Brown  Procedure(s) Performed: TOTAL KNEE ARTHROPLASTY (Right Knee)     Patient location during evaluation: PACU Anesthesia Type: Regional, Spinal and MAC Level of consciousness: oriented and awake and alert Pain management: pain level controlled Vital Signs Assessment: post-procedure vital signs reviewed and stable Respiratory status: spontaneous breathing, respiratory function stable and patient connected to nasal cannula oxygen Cardiovascular status: blood pressure returned to baseline and stable Postop Assessment: no headache, no backache, no apparent nausea or vomiting and spinal receding Anesthetic complications: no   No complications documented.  Last Vitals:  Vitals:   01/19/20 1315 01/19/20 1330  BP: 123/65 129/69  Pulse: 82 81  Resp: 12 16  Temp: 36.7 C   SpO2: 98% 99%    Last Pain:  Vitals:   01/19/20 1315  TempSrc:   PainSc: 4                  Kielee Care P Lacorey Brusca

## 2020-01-19 NOTE — Anesthesia Procedure Notes (Signed)
Spinal  Patient location during procedure: OR Start time: 01/19/2020 7:40 AM End time: 01/19/2020 7:43 AM Staffing Performed: anesthesiologist  Anesthesiologist: Atilano Median, DO Preanesthetic Checklist Completed: patient identified, IV checked, site marked, risks and benefits discussed, surgical consent, monitors and equipment checked, pre-op evaluation and timeout performed Spinal Block Patient position: sitting Prep: DuraPrep Patient monitoring: heart rate, cardiac monitor, continuous pulse ox and blood pressure Approach: midline Location: L3-4 Injection technique: single-shot Needle Needle type: Pencan  Needle gauge: 24 G Needle length: 10 cm Additional Notes Patient identified. Risks/Benefits/Options discussed with patient including but not limited to bleeding, infection, nerve damage, paralysis, failed block, incomplete pain control, headache, blood pressure changes, nausea, vomiting, reactions to medications, itching and postpartum back pain. Confirmed with bedside nurse the patient's most recent platelet count. Confirmed with patient that they are not currently taking any anticoagulation, have any bleeding history or any family history of bleeding disorders. Patient expressed understanding and wished to proceed. All questions were answered. Sterile technique was used throughout the entire procedure. Please see nursing notes for vital signs. Warning signs of high block given to the patient including shortness of breath, tingling/numbness in hands, complete motor block, or any concerning symptoms with instructions to call for help. Patient was given instructions on fall risk and not to get out of bed. All questions and concerns addressed with instructions to call with any issues or inadequate analgesia.

## 2020-01-19 NOTE — Plan of Care (Signed)
  Problem: Education: Goal: Knowledge of General Education information will improve Description Including pain rating scale, medication(s)/side effects and non-pharmacologic comfort measures Outcome: Progressing   Problem: Nutrition: Goal: Adequate nutrition will be maintained Outcome: Progressing   Problem: Pain Managment: Goal: General experience of comfort will improve Outcome: Progressing   

## 2020-01-19 NOTE — Anesthesia Procedure Notes (Signed)
Date/Time: 01/19/2020 7:35 AM Performed by: Florene Route, CRNA Oxygen Delivery Method: Simple face mask

## 2020-01-19 NOTE — Transfer of Care (Signed)
Immediate Anesthesia Transfer of Care Note  Patient: Laura Brown  Procedure(s) Performed: TOTAL KNEE ARTHROPLASTY (Right Knee)  Patient Location: PACU  Anesthesia Type:Spinal  Level of Consciousness: awake and alert   Airway & Oxygen Therapy: Patient Spontanous Breathing and Patient connected to face mask oxygen  Post-op Assessment: Report given to RN and Post -op Vital signs reviewed and stable  Post vital signs: Reviewed and stable  Last Vitals:  Vitals Value Taken Time  BP    Temp    Pulse 77 01/19/20 1022  Resp 8 01/19/20 1022  SpO2 98 % 01/19/20 1022  Vitals shown include unvalidated device data.  Last Pain:  Vitals:   01/19/20 0539  TempSrc: Oral         Complications: No complications documented.

## 2020-01-19 NOTE — Evaluation (Signed)
Physical Therapy Evaluation Patient Details Name: Laura Brown MRN: [id number] DOB: Jan 28, 1971 Today's Date: 01/19/2020   History of Present Illness  Patient is 49 y.o. female s/p Rt TKA on 01/19/20 with PMH significant for OA, depression, anxiety.  Clinical Impression  Laura Brown is a 49 y.o. female POD 0 s/p Rt TKA. Patient reports independence with mobility at baseline. Patient is now limited by functional impairments (see PT problem list below) and requires min assist for transfers and gait with RW. Patient was able to ambulate ~8 feet with RW and min assist. Distance was limited by weak/lightheaded feeling. BP 124/72 and HR in 80's. Symptoms improved with seated rest. Patient instructed in exercise to facilitate circulation. Patient will benefit from continued skilled PT interventions to address impairments and progress towards PLOF. Acute PT will follow to progress mobility and stair training in preparation for safe discharge home.     Follow Up Recommendations Follow surgeon's recommendation for DC plan and follow-up therapies    Equipment Recommendations  None recommended by PT    Recommendations for Other Services       Precautions / Restrictions Precautions Precautions: Fall Restrictions Weight Bearing Restrictions: No Other Position/Activity Restrictions: WBAT      Mobility  Bed Mobility Overal bed mobility: Needs Assistance Bed Mobility: Supine to Sit     Supine to sit: Min assist;HOB elevated     General bed mobility comments: VC's to use bed rail and assist for Rt LE to move to EOB.    Transfers Overall transfer level: Needs assistance Equipment used: Rolling walker (2 wheeled) Transfers: Sit to/from Stand Sit to Stand: Min assist;From elevated surface;Mod assist         General transfer comment: VC's for technique with RW/hand placement, mod assist to initiate power up and min assist to steady in standing.  Ambulation/Gait Ambulation/Gait  assistance: Min assist Gait Distance (Feet): 8 Feet Assistive device: Rolling walker (2 wheeled) Gait Pattern/deviations: Step-to pattern;Decreased stride length;Decreased weight shift to right Gait velocity: decr   General Gait Details: VC's for step pattern and proximity to RW, no overt LOB or buckling at Rt knee.  Stairs            Wheelchair Mobility    Modified Rankin (Stroke Patients Only)       Balance Overall balance assessment: Needs assistance Sitting-balance support: Feet supported Sitting balance-Leahy Scale: Good     Standing balance support: During functional activity;Bilateral upper extremity supported Standing balance-Leahy Scale: Fair                               Pertinent Vitals/Pain Pain Assessment: 0-10 Pain Score: 5  Pain Location: Rt knee Pain Descriptors / Indicators: Aching;Discomfort Pain Intervention(s): Limited activity within patient's tolerance;Monitored during session;Repositioned;Ice applied    Home Living Family/patient expects to be discharged to:: Private residence Living Arrangements: Children Available Help at Discharge: Family Type of Home: House Home Access: Stairs to enter Entrance Stairs-Rails: Left Entrance Stairs-Number of Steps: 4 Home Layout: One level Home Equipment: Environmental consultant - 2 wheels;Cane - single point;Shower seat;Bedside commode Additional Comments: pt's mom lives next door and can assist as needed.    Prior Function Level of Independence: Independent               Hand Dominance   Dominant Hand: Right    Extremity/Trunk Assessment   Upper Extremity Assessment Upper Extremity Assessment: Overall WFL for tasks assessed  Lower Extremity Assessment Lower Extremity Assessment: RLE deficits/detail RLE Deficits / Details: good quad activation, no extensor lag with SLR, 3/5 or better RLE Sensation: WNL RLE Coordination: WNL    Cervical / Trunk Assessment Cervical / Trunk Assessment:  Normal  Communication   Communication: No difficulties  Cognition Arousal/Alertness: Awake/alert Behavior During Therapy: WFL for tasks assessed/performed Overall Cognitive Status: Within Functional Limits for tasks assessed                                        General Comments      Exercises Total Joint Exercises Ankle Circles/Pumps: AROM;Both;20 reps;Seated   Assessment/Plan    PT Assessment Patient needs continued PT services  PT Problem List Decreased strength;Decreased range of motion;Decreased activity tolerance;Decreased balance;Decreased mobility;Decreased knowledge of use of DME;Decreased knowledge of precautions;Pain       PT Treatment Interventions DME instruction;Gait training;Stair training;Functional mobility training;Therapeutic activities;Therapeutic exercise;Balance training;Patient/family education    PT Goals (Current goals can be found in the Care Plan section)  Acute Rehab PT Goals Patient Stated Goal: recover independence PT Goal Formulation: With patient Time For Goal Achievement: 01/26/20 Potential to Achieve Goals: Good    Frequency 7X/week   Barriers to discharge        Co-evaluation               AM-PAC PT "6 Clicks" Mobility  Outcome Measure Help needed turning from your back to your side while in a flat bed without using bedrails?: A Little Help needed moving from lying on your back to sitting on the side of a flat bed without using bedrails?: A Little Help needed moving to and from a bed to a chair (including a wheelchair)?: A Little Help needed standing up from a chair using your arms (e.g., wheelchair or bedside chair)?: A Little Help needed to walk in hospital room?: A Little Help needed climbing 3-5 steps with a railing? : A Little 6 Click Score: 18    End of Session Equipment Utilized During Treatment: Gait belt Activity Tolerance: Patient tolerated treatment well Patient left: in chair;with call  bell/phone within reach;with chair alarm set Nurse Communication: Mobility status PT Visit Diagnosis: Muscle weakness (generalized) (M62.81);Difficulty in walking, not elsewhere classified (R26.2);Pain Pain - Right/Left: Right Pain - part of body: Knee    Time: 3734-2876 PT Time Calculation (min) (ACUTE ONLY): 19 min   Charges:   PT Evaluation $PT Eval Low Complexity: 1 Low          Wynn Maudlin, DPT Acute Rehabilitation Services Office [phone] Pager [phone]    Anitra Lauth 01/19/2020, 3:42 PM

## 2020-01-19 NOTE — Anesthesia Procedure Notes (Signed)
Anesthesia Regional Block: Adductor canal block   Pre-Anesthetic Checklist: ,, timeout performed, Correct Patient, Correct Site, Correct Laterality, Correct Procedure, Correct Position, site marked, Risks and benefits discussed,  Surgical consent,  Pre-op evaluation,  At surgeon's request and post-op pain management  Laterality: Right  Prep: Dura Prep       Needles:  Injection technique: Single-shot  Needle Type: Echogenic Stimulator Needle     Needle Length: 9cm  Needle Gauge: 20     Additional Needles:   Procedures:,,,, ultrasound used (permanent image in chart),,,,  Narrative:  Start time: 01/19/2020 6:55 AM End time: 01/19/2020 7:00 AM Injection made incrementally with aspirations every 5 mL.  Performed by: Personally  Anesthesiologist: Atilano Median, DO  Additional Notes: Patient identified. Risks/Benefits/Options discussed with patient including but not limited to bleeding, infection, nerve damage, failed block, incomplete pain control. Patient expressed understanding and wished to proceed. All questions were answered. Sterile technique was used throughout the entire procedure. Please see nursing notes for vital signs. Aspirated in 5cc intervals with injection for negative confirmation. Patient was given instructions on fall risk and not to get out of bed. All questions and concerns addressed with instructions to call with any issues or inadequate analgesia.

## 2020-01-20 ENCOUNTER — Other Ambulatory Visit (HOSPITAL_COMMUNITY): Payer: Self-pay | Admitting: Surgical

## 2020-01-20 ENCOUNTER — Encounter (HOSPITAL_COMMUNITY): Payer: Self-pay | Admitting: Orthopedic Surgery

## 2020-01-20 DIAGNOSIS — M1711 Unilateral primary osteoarthritis, right knee: Secondary | ICD-10-CM | POA: Diagnosis not present

## 2020-01-20 LAB — BASIC METABOLIC PANEL
Anion gap: 9 (ref 5–15)
BUN: 8 mg/dL (ref 6–20)
CO2: 25 mmol/L (ref 22–32)
Calcium: 8.3 mg/dL — ABNORMAL LOW (ref 8.9–10.3)
Chloride: 102 mmol/L (ref 98–111)
Creatinine, Ser: 0.64 mg/dL (ref 0.44–1.00)
GFR, Estimated: >60 mL/min (ref >60)
Glucose, Bld: 133 mg/dL — ABNORMAL HIGH (ref 70–99)
Potassium: 4 mmol/L (ref 3.5–5.1)
Sodium: 136 mmol/L (ref 135–145)

## 2020-01-20 LAB — CBC
HCT: 39.3 % (ref 36.0–46.0)
Hemoglobin: 12.4 g/dL (ref 12.0–15.0)
MCH: 30.2 pg (ref 26.0–34.0)
MCHC: 31.6 g/dL (ref 30.0–36.0)
MCV: 95.6 fL (ref 80.0–100.0)
Platelets: 318 10*3/uL (ref 150–400)
RBC: 4.11 MIL/uL (ref 3.87–5.11)
RDW: 13 % (ref 11.5–15.5)
WBC: 11.3 10*3/uL — ABNORMAL HIGH (ref 4.0–10.5)
nRBC: 0 % (ref 0.0–0.2)

## 2020-01-20 MED ORDER — OXYCODONE HCL 5 MG PO TABS: 5.0000 mg | ORAL_TABLET | ORAL | 0 refills | Status: DC | PRN

## 2020-01-20 MED FILL — ONDANSETRON HCL 4 MG TABS: 4 | 3 days supply | Qty: 10 | Fill #0

## 2020-01-20 MED FILL — BACLOFEN 10 MG TABS: 10 | 17 days supply | Qty: 50 | Fill #0

## 2020-01-20 MED FILL — oxyCODONE HCL 5 MG TABS: 5 | 5 days supply | Qty: 30 | Fill #0

## 2020-01-20 NOTE — Discharge Instructions (Signed)
INSTRUCTIONS AFTER JOINT REPLACEMENT  ° °o Remove items at home which could result in a fall. This includes throw rugs or furniture in walking pathways °o ICE to the affected joint every three hours while awake for 30 minutes at a time, for at least the first 3-5 days, and then as needed for pain and swelling.  Continue to use ice for pain and swelling. You may notice swelling that will progress down to the foot and ankle.  This is normal after surgery.  Elevate your leg when you are not up walking on it.   °o Continue to use the breathing machine you got in the hospital (incentive spirometer) which will help keep your temperature down.  It is common for your temperature to cycle up and down following surgery, especially at night when you are not up moving around and exerting yourself.  The breathing machine keeps your lungs expanded and your temperature down. ° ° °DIET:  As you were doing prior to hospitalization, we recommend a well-balanced diet. ° °DRESSING / WOUND CARE / SHOWERING ° °You may change your dressing 3-5 days after surgery.  Then change the dressing every day with sterile gauze.  Please use good hand washing techniques before changing the dressing.  Do not use any lotions or creams on the incision until instructed by your surgeon. ° °ACTIVITY ° °o Increase activity slowly as tolerated, but follow the weight bearing instructions below.   °o No driving for 6 weeks or until further direction given by your physician.  You cannot drive while taking narcotics.  °o No lifting or carrying greater than 10 lbs. until further directed by your surgeon. °o Avoid periods of inactivity such as sitting longer than an hour when not asleep. This helps prevent blood clots.  °o You may return to work once you are authorized by your doctor.  ° ° ° °WEIGHT BEARING  ° °Weight bearing as tolerated with assist device (walker, cane, etc) as directed, use it as long as suggested by your surgeon or therapist, typically at  least 4-6 weeks. ° ° °EXERCISES ° °Results after joint replacement surgery are often greatly improved when you follow the exercise, range of motion and muscle strengthening exercises prescribed by your doctor. Safety measures are also important to protect the joint from further injury. Any time any of these exercises cause you to have increased pain or swelling, decrease what you are doing until you are comfortable again and then slowly increase them. If you have problems or questions, call your caregiver or physical therapist for advice.  ° °Rehabilitation is important following a joint replacement. After just a few days of immobilization, the muscles of the leg can become weakened and shrink (atrophy).  These exercises are designed to build up the tone and strength of the thigh and leg muscles and to improve motion. Often times heat used for twenty to thirty minutes before working out will loosen up your tissues and help with improving the range of motion but do not use heat for the first two weeks following surgery (sometimes heat can increase post-operative swelling).  ° °These exercises can be done on a training (exercise) mat, on the floor, on a table or on a bed. Use whatever works the best and is most comfortable for you.    Use music or television while you are exercising so that the exercises are a pleasant break in your day. This will make your life better with the exercises acting as a break   in your routine that you can look forward to.   Perform all exercises about fifteen times, three times per day or as directed.  You should exercise both the operative leg and the other leg as well. ° °Exercises include: °  °• Quad Sets - Tighten up the muscle on the front of the thigh (Quad) and hold for 5-10 seconds.   °• Straight Leg Raises - With your knee straight (if you were given a brace, keep it on), lift the leg to 60 degrees, hold for 3 seconds, and slowly lower the leg.  Perform this exercise against  resistance later as your leg gets stronger.  °• Leg Slides: Lying on your back, slowly slide your foot toward your buttocks, bending your knee up off the floor (only go as far as is comfortable). Then slowly slide your foot back down until your leg is flat on the floor again.  °• Angel Wings: Lying on your back spread your legs to the side as far apart as you can without causing discomfort.  °• Hamstring Strength:  Lying on your back, push your heel against the floor with your leg straight by tightening up the muscles of your buttocks.  Repeat, but this time bend your knee to a comfortable angle, and push your heel against the floor.  You may put a pillow under the heel to make it more comfortable if necessary.  ° °A rehabilitation program following joint replacement surgery can speed recovery and prevent re-injury in the future due to weakened muscles. Contact your doctor or a physical therapist for more information on knee rehabilitation.  ° ° °CONSTIPATION ° °Constipation is defined medically as fewer than three stools per week and severe constipation as less than one stool per week.  Even if you have a regular bowel pattern at home, your normal regimen is likely to be disrupted due to multiple reasons following surgery.  Combination of anesthesia, postoperative narcotics, change in appetite and fluid intake all can affect your bowels.  ° °YOU MUST use at least one of the following options; they are listed in order of increasing strength to get the job done.  They are all available over the counter, and you may need to use some, POSSIBLY even all of these options:   ° °Drink plenty of fluids (prune juice may be helpful) and high fiber foods °Colace 100 mg by mouth twice a day  °Senokot for constipation as directed and as needed Dulcolax (bisacodyl), take with full glass of water  °Miralax (polyethylene glycol) once or twice a day as needed. ° °If you have tried all these things and are unable to have a bowel  movement in the first 3-4 days after surgery call either your surgeon or your primary doctor.   ° °If you experience loose stools or diarrhea, hold the medications until you stool forms back up.  If your symptoms do not get better within 1 week or if they get worse, check with your doctor.  If you experience "the worst abdominal pain ever" or develop nausea or vomiting, please contact the office immediately for further recommendations for treatment. ° ° °ITCHING:  If you experience itching with your medications, try taking only a single pain pill, or even half a pain pill at a time.  You can also use Benadryl over the counter for itching or also to help with sleep.  ° °TED HOSE STOCKINGS:  Use stockings on both legs until for at least 2 weeks or as   directed by physician office. They may be removed at night for sleeping.  MEDICATIONS:  See your medication summary on the After Visit Summary that nursing will review with you.  You may have some home medications which will be placed on hold until you complete the course of blood thinner medication.  It is important for you to complete the blood thinner medication as prescribed.  PRECAUTIONS:  If you experience chest pain or shortness of breath - call 911 immediately for transfer to the hospital emergency department.   If you develop a fever greater that 101 F, purulent drainage from wound, increased redness or drainage from wound, foul odor from the wound/dressing, or calf pain - CONTACT YOUR SURGEON.                                                   FOLLOW-UP APPOINTMENTS:  If you do not already have a post-op appointment, please call the office for an appointment to be seen by your surgeon.  Guidelines for how soon to be seen are listed in your After Visit Summary, but are typically between 1-4 weeks after surgery.  OTHER INSTRUCTIONS:   Knee Replacement:  Do not place pillow under knee, focus on keeping the knee straight while resting. CPM  instructions: 0-90 degrees, 2 hours in the morning, 2 hours in the afternoon, and 2 hours in the evening. Place foam block, curve side up under heel at all times except when in CPM or when walking.  DO NOT modify, tear, cut, or change the foam block in any way.   DENTAL ANTIBIOTICS:  In most cases prophylactic antibiotics for Dental procdeures after total joint surgery are not necessary.  Exceptions are as follows:  1. History of prior total joint infection  2. Severely immunocompromised (Organ Transplant, cancer chemotherapy, Rheumatoid biologic meds such as Humera)  3. Poorly controlled diabetes (A1C &gt; 8.0, blood glucose over 200)  If you have one of these conditions, contact your surgeon for an antibiotic prescription, prior to your dental procedure.   MAKE SURE YOU:   Understand these instructions.   Get help right away if you are not doing well or get worse.    Thank you for letting us be a part of your medical care team.  It is a privilege we respect greatly.  We hope these instructions will help you stay on track for a fast and full recovery!   PT APPOINTMENT: [facility] 12:45pm at [address] [place], [place] [address]

## 2020-01-20 NOTE — Progress Notes (Signed)
     Subjective: 1 Day Post-Op s/p Procedure(s): TOTAL KNEE ARTHROPLASTY   Patient is alert, oriented, yes  Patient reports pain as moderate.   Denies chest pain, SOB, Calf pain. No nausea/vomiting. No other complaints.   Objective:  PE: VITALS:   Vitals:   01/19/20 1900 01/19/20 2037 01/20/20 0129 01/20/20 0518  BP:  (!) 122/59 128/64 121/69  Pulse:  86 78 85  Resp:  16 16 18   Temp:  97.6 F (36.4 C) 98 F (36.7 C) 98 F (36.7 C)  TempSrc:  Oral Oral Oral  SpO2:  96% 97% 97%  Weight: 113.7 kg     Height: 5\' 6"  (1.676 m)       ABD soft Neurovascular intact Sensation intact distally Intact pulses distally Dorsiflexion/Plantar flexion intact Incision: scant drainage  LABS  Results for orders placed or performed during the hospital encounter of 01/19/20 (from the past 24 hour(s))  CBC     Status: Abnormal   Collection Time: 01/20/20  3:29 AM  Result Value Ref Range   WBC 11.3 (H) 4.0 - 10.5 K/uL   RBC 4.11 3.87 - 5.11 MIL/uL   Hemoglobin 12.4 12.0 - 15.0 g/dL   HCT 01/21/20 01/22/20 - 38.2 %   MCV 95.6 80.0 - 100.0 fL   MCH 30.2 26.0 - 34.0 pg   MCHC 31.6 30.0 - 36.0 g/dL   RDW 50.5 39.7 - 67.3 %   Platelets 318 150 - 400 K/uL   nRBC 0.0 0.0 - 0.2 %  Basic metabolic panel     Status: Abnormal   Collection Time: 01/20/20  3:29 AM  Result Value Ref Range   Sodium 136 135 - 145 mmol/L   Potassium 4.0 3.5 - 5.1 mmol/L   Chloride 102 98 - 111 mmol/L   CO2 25 22 - 32 mmol/L   Glucose, Bld 133 (H) 70 - 99 mg/dL   BUN 8 6 - 20 mg/dL   Creatinine, Ser 37.9 0.44 - 1.00 mg/dL   Calcium 8.3 (L) 8.9 - 10.3 mg/dL   GFR, Estimated 01/22/20 0.24 mL/min   Anion gap 9 5 - 15    DG Knee Right Port  Result Date: 01/19/2020 CLINICAL DATA:  Status post right total knee arthroplasty EXAM: PORTABLE RIGHT KNEE - 1-2 VIEW COMPARISON:  None. FINDINGS: Right total knee arthroplasty, without evidence of complication. Soft tissue gas in the suprapatellar fossa. The joint spaces are preserved.  IMPRESSION: Right total knee arthroplasty, without evidence of complication. Electronically Signed   By: >73 M.D.   On: 01/19/2020 11:16    Assessment/Plan: Principal Problem:   S/P TKR (total knee replacement), right Active Problems:   Osteoarthritis of right knee   S/P total knee replacement, right    1 Day Post-Op s/p Procedure(s): TOTAL KNEE ARTHROPLASTY - overall doing well  Weightbearing: WBAT RLE, up with therapy Insicional and dressing care: Reinforce dressings as needed - Mepilex changed this AM, if drainage noted on mepilex prior to discharge, please change prior to discharge VTE prophylaxis: Aspirin 325mg  BID x 30 days Pain control: continue current regimen, will d/c on oxycodone Follow - up plan: 2 weeks with Dr. Charline Bills Dispo: Pending PT eval, plan to D/c this afternoon if passes PT Contact information:   Weekdays 8-5 , PA-C [phone] A fter hours and holidays please check [email or web address] for group call information for [facility]  10-02-2005 01/20/2020, 8:11 AM

## 2020-01-20 NOTE — Discharge Summary (Signed)
Discharge Summary  Patient ID: Laura Brown MRN: [id number] DOB/AGE: November 30, 1970 49 y.o.  Admit date: 01/19/2020 Discharge date: 01/20/2020  Admission Diagnoses:  S/P TKR (total knee replacement), right  Discharge Diagnoses:  Principal Problem:   S/P TKR (total knee replacement), right Active Problems:   Osteoarthritis of right knee   S/P total knee replacement, right   Past Medical History:  Diagnosis Date  . Anxiety   . Depression   . Osteoarthritis of both knees     Surgeries: Procedure(s): TOTAL KNEE ARTHROPLASTY on 01/19/2020   Consultants (if any):   Discharged Condition: Improved  Hospital Course: Laura Brown is an 49 y.o. female who was admitted 01/19/2020 with a diagnosis of S/P TKR (total knee replacement), right and went to the operating room on 01/19/2020 and underwent the above named procedures.    She was given perioperative antibiotics:  Anti-infectives (From admission, onward)   Start     Dose/Rate Route Frequency Ordered Stop   01/19/20 1400  ceFAZolin (ANCEF) IVPB 2g/100 mL premix        2 g 200 mL/hr over 30 Minutes Intravenous Every 6 hours 01/19/20 1052 01/19/20 2042   01/19/20 0600  ceFAZolin (ANCEF) IVPB 2g/100 mL premix        2 g 200 mL/hr over 30 Minutes Intravenous On call to O.R. 01/19/20 9242 01/19/20 0758    .  She was given sequential compression devices, early ambulation, and aspirin for DVT prophylaxis.  She benefited maximally from the hospital stay and there were no complications.    Recent vital signs:  Vitals:   01/20/20 0129 01/20/20 0518  BP: 128/64 121/69  Pulse: 78 85  Resp: 16 18  Temp: 98 F (36.7 C) 98 F (36.7 C)  SpO2: 97% 97%    Recent laboratory studies:  Lab Results  Component Value Date   HGB 12.4 01/20/2020   HGB 13.0 01/11/2020   HGB 14.8 11/07/2017   Lab Results  Component Value Date   WBC 11.3 (H) 01/20/2020   PLT 318 01/20/2020   Lab Results  Component Value Date   INR 0.9  03/22/2011   Lab Results  Component Value Date   NA 136 01/20/2020   K 4.0 01/20/2020   CL 102 01/20/2020   CO2 25 01/20/2020   BUN 8 01/20/2020   CREATININE 0.64 01/20/2020   GLUCOSE 133 (H) 01/20/2020    Discharge Medications:   Allergies as of 01/20/2020   No Known Allergies     Medication List    TAKE these medications   BD Pen Needle Nano 2nd Gen 32G X 4 MM Misc Generic drug: Insulin Pen Needle 1 Device by Does not apply route daily.   buPROPion 200 MG 12 hr tablet Commonly known as: Wellbutrin SR Take 1 tablet (200 mg total) by mouth 2 (two) times daily.   DULoxetine 60 MG capsule Commonly known as: CYMBALTA Take 120 mg by mouth daily.   fexofenadine 180 MG tablet Commonly known as: ALLEGRA Take 1 tablet (180 mg total) by mouth daily.   fluticasone 50 MCG/ACT nasal spray Commonly known as: FLONASE Place 2 sprays into both nostrils daily. What changed:   when to take this  reasons to take this   Otezla 30 MG Tabs Generic drug: Apremilast Take 1 tablet (30 mg total) by mouth 2 (two) times daily. What changed: additional instructions   Saxenda 18 MG/3ML Sopn Generic drug: Liraglutide -Weight Management Inject 3 mg into the skin daily.  verapamil 100 MG 24 hr capsule Commonly known as: VERELAN Take 200 mg by mouth at bedtime.   Vitamin D (Ergocalciferol) 1.25 MG (50000 UNIT) Caps capsule Commonly known as: DRISDOL Take 1 capsule (50,000 Units total) by mouth every 7 (seven) days.   Vitamin D 50 MCG (2000 UT) Caps Take 1 capsule (2,000 Units total) by mouth daily.       Diagnostic Studies: DG Knee Right Port  Result Date: 01/19/2020 CLINICAL DATA:  Status post right total knee arthroplasty EXAM: PORTABLE RIGHT KNEE - 1-2 VIEW COMPARISON:  None. FINDINGS: Right total knee arthroplasty, without evidence of complication. Soft tissue gas in the suprapatellar fossa. The joint spaces are preserved. IMPRESSION: Right total knee arthroplasty, without  evidence of complication. Electronically Signed   By: Charline Bills M.D.   On: 01/19/2020 11:16    Disposition:      Follow-up Information    Teryl Lucy, MD. Schedule an appointment as soon as possible for a visit in 2 weeks.   Specialty: Orthopedic Surgery Contact information: [address] [place] [address] [phone]                Signed: Annita Brod 01/20/2020, 8:13 AM

## 2020-01-20 NOTE — TOC Initial Note (Addendum)
Transition of Care [facility]) - Initial/Assessment Note    Patient Details  Name: Laura Brown MRN: [id number] Date of Birth: 1970/05/06  [facility]) CM/SW Contact:    Lennart Pall, LCSW Phone Number: 01/20/2020, 2:39 PM  Clinical Narrative:                 Met with pt this morning to discuss dc needs.  Unfortunately, it was originally thought that [facility] would be providing HHPT but they are not in network with her insurance.  I have called all agencies that could potentially provide HHPT in [facility] and have not been able to [place] agency to cover:  [facility] - not in network [facility] - does not service that area [facility] - not in [place] - no PT staffing coverage in that area [facility] - not in [place] - not in [place] [employer] - not in network [facility] - no PT to staff that area [facility] - not in network  Currently waiting to see if [facility] can possibly cover, however, initially told they do not cover in pt's zip code.  Will continue to work on this but not hopeful.  Addendum:  All HH agencies have declined.  Have alerted MD/PA Merlene Pulling, PA-C) and they have discussed OPPT with pt who is agreeable with referral made to [facility] PT for pt to begin tomorrow.  [facility] for dc.    Barriers to Discharge: No Home Care Agency will accept this patient   Patient Goals and CMS Choice        Expected Discharge Plan and Services           Expected Discharge Date: 01/19/20               DME Arranged: N/A DME Agency: NA                  Prior Living Arrangements/Services                       Activities of Daily Living Home Assistive Devices/Equipment: Environmental consultant (specify type),Cane (specify quad or straight),Shower chair without back ADL Screening (condition at time of admission) Patient's cognitive ability adequate to safely complete daily activities?: Yes Is the patient deaf or have difficulty  hearing?: No Does the patient have difficulty seeing, even when wearing glasses/contacts?: No Does the patient have difficulty concentrating, remembering, or making decisions?: No Patient able to express need for assistance with ADLs?: Yes Does the patient have difficulty dressing or bathing?: No Independently performs ADLs?: Yes (appropriate for developmental age) Does the patient have difficulty walking or climbing stairs?: Yes Weakness of Legs: None Weakness of Arms/Hands: None  Permission Sought/Granted                  Emotional Assessment              Admission diagnosis:  S/P total knee replacement, right [Z96.651] Patient Active Problem List   Diagnosis Date Noted  . S/P TKR (total knee replacement), right 01/19/2020  . S/P total knee replacement, right 01/19/2020  . Osteoarthritis of right knee 01/05/2020  . Vitamin D deficiency 08/19/2019  . S/P laparoscopic sleeve gastrectomy 08/19/2019  . Depression 07/15/2018  . Class 3 severe obesity with serious comorbidity and body mass index (BMI) of 40.0 to 44.9 in adult ([place]) 07/15/2018  . Primary osteoarthritis of both knees 04/20/2014  . Low back pain 04/20/2014  PCP:  Fanny Bien, MD Pharmacy:   [facility] [place] [place] [place] [address] [place] [address] Phone: [phone] Fax: [phone]     Social Determinants of Health (SDOH) Interventions    Readmission Risk Interventions No flowsheet data found.

## 2020-01-20 NOTE — Progress Notes (Signed)
Physical Therapy Treatment Patient Details Name: Laura Brown MRN: [id number] DOB: [birth year] Today's Date: 01/20/2020    History of Present Illness Patient is 49 y.o. female s/p Rt TKA on 01/19/20 with PMH significant for OA, depression, anxiety.    PT Comments    Pt ambulated in hallway and upon return to room, mepilex dressing soaked with blood.  Notified RN.    Follow Up Recommendations  Follow surgeon's recommendation for DC plan and follow-up therapies     Equipment Recommendations  None recommended by PT    Recommendations for Other Services       Precautions / Restrictions Precautions Precautions: Fall;Knee Restrictions Other Position/Activity Restrictions: WBAT    Mobility  Bed Mobility Overal bed mobility: Needs Assistance Bed Mobility: Supine to Sit     Supine to sit: Min guard     General bed mobility comments: increased time and effort  Transfers Overall transfer level: Needs assistance Equipment used: Rolling walker (2 wheeled) Transfers: Sit to/from Stand Sit to Stand: Min assist         General transfer comment: verbal cues for UE and LE positioning; assist to rise and steady  Ambulation/Gait Ambulation/Gait assistance: Min guard Gait Distance (Feet): 140 Feet Assistive device: Rolling walker (2 wheeled) Gait Pattern/deviations: Step-to pattern;Decreased weight shift to right;Antalgic     General Gait Details: verbal cues for sequence, RW positioning, step length   Stairs             Wheelchair Mobility    Modified Rankin (Stroke Patients Only)       Balance                                            Cognition Arousal/Alertness: Awake/alert Behavior During Therapy: WFL for tasks assessed/performed Overall Cognitive Status: Within Functional Limits for tasks assessed                                        Exercises      General Comments        Pertinent Vitals/Pain Pain  Assessment: 0-10 Pain Score: 4  Pain Location: Rt knee Pain Descriptors / Indicators: Aching;Discomfort;Sore Pain Intervention(s): Repositioned;Monitored during session;Premedicated before session    Home Living                      Prior Function            PT Goals (current goals can now be found in the care plan section) Progress towards PT goals: Progressing toward goals    Frequency    7X/week      PT Plan Current plan remains appropriate    Co-evaluation              AM-PAC PT "6 Clicks" Mobility   Outcome Measure  Help needed turning from your back to your side while in a flat bed without using bedrails?: A Little Help needed moving from lying on your back to sitting on the side of a flat bed without using bedrails?: A Little Help needed moving to and from a bed to a chair (including a wheelchair)?: A Little Help needed standing up from a chair using your arms (e.g., wheelchair or bedside chair)?: A Little Help needed to walk in hospital room?: A  Little Help needed climbing 3-5 steps with a railing? : A Little 6 Click Score: 18    End of Session Equipment Utilized During Treatment: Gait belt Activity Tolerance: Patient tolerated treatment well Patient left: with call bell/phone within reach;in chair Nurse Communication: Mobility status PT Visit Diagnosis: Muscle weakness (generalized) (M62.81);Difficulty in walking, not elsewhere classified (R26.2)     Time: 1610-9604 PT Time Calculation (min) (ACUTE ONLY): 15 min  Charges:  $Gait Training: 8-22 mins                     Paulino Door, DPT Acute Rehabilitation Services Pager: [phone] Office: [phone]  Maida Sale E 01/20/2020, 1:47 PM

## 2020-01-20 NOTE — Progress Notes (Signed)
Physical Therapy Treatment Patient Details Name: Laura Brown MRN: [id number] DOB: 29-Apr-1970 Today's Date: 01/20/2020    History of Present Illness Patient is 49 y.o. female s/p Rt TKA on 01/19/20 with PMH significant for OA, depression, anxiety.    PT Comments    Pt assisted to bathroom and then ambulated in hallway.  Pt practiced safe stair technique.  Pt provided with HEP and Stair handouts.  Pt mobilized with KI this afternoon to limit flexion (keeping blood from seeping) and mepilex clean and intact upon return to room.  Pt feels comfortable discharging home today.   Follow Up Recommendations  Follow surgeon's recommendation for DC plan and follow-up therapies     Equipment Recommendations  None recommended by PT    Recommendations for Other Services       Precautions / Restrictions Precautions Precautions: Fall;Knee Required Braces or Orthoses: Knee Immobilizer - Right Restrictions Weight Bearing Restrictions: No Other Position/Activity Restrictions: WBAT    Mobility  Bed Mobility Overal bed mobility: Needs Assistance Bed Mobility: Supine to Sit;Sit to Supine     Supine to sit: Supervision Sit to supine: Supervision   General bed mobility comments: increased time and effort  Transfers Overall transfer level: Needs assistance Equipment used: Rolling walker (2 wheeled) Transfers: Sit to/from Stand Sit to Stand: Min guard         General transfer comment: verbal cues for UE and LE positioning  Ambulation/Gait Ambulation/Gait assistance: Min guard Gait Distance (Feet): 40 Feet Assistive device: Rolling walker (2 wheeled) Gait Pattern/deviations: Step-to pattern;Decreased weight shift to right;Antalgic Gait velocity: decr   General Gait Details: verbal cues for sequence, RW positioning, step length   Stairs Stairs: Yes Stairs assistance: Min guard Stair Management: Backwards;Step to pattern;With walker Number of Stairs: 3 General stair  comments: verbal cues for sequence, RW positioning, safety; pt performed twice and reports understanding; provided handout   Wheelchair Mobility    Modified Rankin (Stroke Patients Only)       Balance                                            Cognition Arousal/Alertness: Awake/alert Behavior During Therapy: WFL for tasks assessed/performed Overall Cognitive Status: Within Functional Limits for tasks assessed                                        Exercises      General Comments        Pertinent Vitals/Pain Pain Assessment: 0-10 Pain Score: 4  Pain Location: Rt knee Pain Descriptors / Indicators: Aching;Discomfort;Sore Pain Intervention(s): Repositioned;Monitored during session;Ice applied    Home Living                      Prior Function            PT Goals (current goals can now be found in the care plan section) Progress towards PT goals: Progressing toward goals    Frequency    7X/week      PT Plan Current plan remains appropriate    Co-evaluation              AM-PAC PT "6 Clicks" Mobility   Outcome Measure  Help needed turning from your back to your side while in a flat  bed without using bedrails?: A Little Help needed moving from lying on your back to sitting on the side of a flat bed without using bedrails?: A Little Help needed moving to and from a bed to a chair (including a wheelchair)?: A Little Help needed standing up from a chair using your arms (e.g., wheelchair or bedside chair)?: A Little Help needed to walk in hospital room?: A Little Help needed climbing 3-5 steps with a railing? : A Little 6 Click Score: 18    End of Session Equipment Utilized During Treatment: Gait belt Activity Tolerance: Patient tolerated treatment well Patient left: with call bell/phone within reach;in bed Nurse Communication: Mobility status PT Visit Diagnosis: Muscle weakness (generalized)  (M62.81);Difficulty in walking, not elsewhere classified (R26.2)     Time: 4008-6761 PT Time Calculation (min) (ACUTE ONLY): 21 min  Charges:  $Gait Training: 8-22 mins                    Paulino Door, DPT Acute Rehabilitation Services Pager: [phone] Office: [phone]  Maida Sale E 01/20/2020, 3:26 PM

## 2020-01-21 LAB — SURGICAL PATHOLOGY

## 2020-01-21 MED FILL — ALPRAZolam 0.25 MG TABS: 0.25 | 5 days supply | Qty: 5 | Fill #0

## 2020-02-01 ENCOUNTER — Other Ambulatory Visit (HOSPITAL_COMMUNITY): Payer: Self-pay | Admitting: Surgical

## 2020-02-01 MED FILL — oxyCODONE HCL 5 MG TABS: 5 | 5 days supply | Qty: 20 | Fill #0

## 2020-02-01 MED FILL — BACLOFEN 10 MG TABS: 10 | 10 days supply | Qty: 30 | Fill #0

## 2020-02-02 ENCOUNTER — Encounter (INDEPENDENT_AMBULATORY_CARE_PROVIDER_SITE_OTHER): Payer: Self-pay | Admitting: Family Medicine

## 2020-02-02 ENCOUNTER — Telehealth (INDEPENDENT_AMBULATORY_CARE_PROVIDER_SITE_OTHER): Payer: No Typology Code available for payment source | Admitting: Family Medicine

## 2020-02-02 ENCOUNTER — Encounter (INDEPENDENT_AMBULATORY_CARE_PROVIDER_SITE_OTHER): Payer: Self-pay

## 2020-02-02 DIAGNOSIS — Z96651 Presence of right artificial knee joint: Secondary | ICD-10-CM

## 2020-02-02 DIAGNOSIS — E559 Vitamin D deficiency, unspecified: Secondary | ICD-10-CM

## 2020-02-02 DIAGNOSIS — Z6841 Body Mass Index (BMI) 40.0 and over, adult: Secondary | ICD-10-CM | POA: Diagnosis not present

## 2020-02-09 ENCOUNTER — Encounter (INDEPENDENT_AMBULATORY_CARE_PROVIDER_SITE_OTHER): Payer: Self-pay | Admitting: Family Medicine

## 2020-02-09 ENCOUNTER — Other Ambulatory Visit (INDEPENDENT_AMBULATORY_CARE_PROVIDER_SITE_OTHER): Payer: Self-pay | Admitting: Family Medicine

## 2020-02-09 MED ORDER — VITAMIN D (ERGOCALCIFEROL) 1.25 MG (50000 UNIT) PO CAPS: 50000.0000 [IU] | ORAL_CAPSULE | ORAL | 0 refills | Status: DC

## 2020-02-09 NOTE — Progress Notes (Signed)
TeleHealth Visit:  Due to the COVID-19 pandemic, this visit was completed with telemedicine (audio/video) technology to reduce patient and provider exposure as well as to preserve personal protective equipment.   Laura Brown has verbally consented to this TeleHealth visit. The patient is located at home, the provider is located at the [facility] office. The participants in this visit include the listed provider and patient. The visit was conducted today via MyChart video.   Chief Complaint: OBESITY Laura Brown is here to discuss her progress with her obesity treatment plan along with follow-up of her obesity related diagnoses. Laura Brown is on keeping a food journal and adhering to recommended goals of 1200-1400 calories and 80 grams of protein daily and states she is following her eating plan approximately 75% of the time. Laura Brown states she is doing 0 minutes 0 times per week.  Today's visit was #: 36 Starting weight: 296 lbs Starting date: 11/07/2017  Interim History: Laura Brown is on 3.0 mg of Saxenda daily and she is tolerating it well. She is averaging between 50-80 grams of protein per day. She is journaling inconsistently since her total knee replacement in 12/2019. She has physical therapy 2 times per week. This will continue for several more weeks. She reports lack of appetite.  Subjective:   1. Vitamin D deficiency Laura Brown's Vit D level is slightly low at 41. She is on weekly prescription Vit D.  2. S/P TKR (total knee replacement), right Laura Brown had a right total knee replacement in 12/2019. She is doing exceptionally well and she reports her pain is minimal. She had a post-op appointment yesterday and all is well.  Assessment/Plan:   1. Vitamin D deficiency  We will refill prescription Vitamin D 50,000 IU every week #4 for 1 month. Laura Brown will follow-up for routine testing of Vitamin D, at least 2-3 times per year to avoid over-replacement.  2. S/P TKR (total knee  replacement), right Laura Brown will continue to follow up with Ortho as directed.  3. Class 3 severe obesity with serious comorbidity and body mass index (BMI) of 40.0 to 44.9 in adult, unspecified obesity type (HCC) Laura Brown is currently in the action stage of change. As such, her goal is to continue with weight loss efforts. She has agreed to keeping a food journal and adhering to recommended goals of 1200-1400 calories and 80 grams of protein daily.   Laura Brown will start journaling consistently.  Exercise goals: Continue with physical therapy.  Behavioral modification strategies: increasing lean protein intake, planning for success and keeping a strict food journal.  Laura Brown has agreed to follow-up with our clinic in 3 weeks virtual, as she is recovering from total knee replacement.   Objective:   VITALS: Per patient if applicable, see vitals. GENERAL: Alert and in no acute distress. CARDIOPULMONARY: No increased WOB. Speaking in clear sentences.  PSYCH: Pleasant and cooperative. Speech normal rate and rhythm. Affect is appropriate. Insight and judgement are appropriate. Attention is focused, linear, and appropriate.  NEURO: Oriented as arrived to appointment on time with no prompting.   Lab Results  Component Value Date   CREATININE 0.64 01/20/2020   BUN 8 01/20/2020   NA 136 01/20/2020   K 4.0 01/20/2020   CL 102 01/20/2020   CO2 25 01/20/2020   Lab Results  Component Value Date   ALT 10 07/15/2019   AST 13 07/15/2019   ALKPHOS 67 07/15/2019   BILITOT 0.3 07/15/2019   Lab Results  Component Value Date   HGBA1C  4.6 (L) 07/15/2019   HGBA1C 4.6 (L) 03/25/2019   HGBA1C 4.7 (L) 11/26/2018   HGBA1C 4.9 11/07/2017   Lab Results  Component Value Date   INSULIN 8.6 07/15/2019   INSULIN 9.7 03/25/2019   INSULIN 6.1 11/26/2018   INSULIN 6.7 11/07/2017   Lab Results  Component Value Date   TSH 0.984 11/07/2017   Lab Results  Component Value Date   CHOL 165 11/26/2018   HDL  62 11/26/2018   LDLCALC 94 11/26/2018   TRIG 44 11/26/2018   Lab Results  Component Value Date   WBC 11.3 (H) 01/20/2020   HGB 12.4 01/20/2020   HCT 39.3 01/20/2020   MCV 95.6 01/20/2020   PLT 318 01/20/2020   Lab Results  Component Value Date   FERRITIN 32 03/22/2011    Attestation Statements:   Reviewed by clinician on day of visit: allergies, medications, problem list, medical history, surgical history, family history, social history, and previous encounter notes.   Trude Mcburney, am acting as Energy manager for [place], FNP-C.  I have reviewed the above documentation for accuracy and completeness, and I agree with the above. - Jesse Sans, FNP

## 2020-03-02 ENCOUNTER — Telehealth: Payer: Self-pay | Admitting: Sports Medicine

## 2020-03-02 ENCOUNTER — Other Ambulatory Visit: Payer: No Typology Code available for payment source

## 2020-03-02 DIAGNOSIS — M79661 Pain in right lower leg: Secondary | ICD-10-CM | POA: Insufficient documentation

## 2020-03-02 NOTE — Assessment & Plan Note (Signed)
This is a pleasant 50 year old female approximately 6 weeks post right knee arthroplasty, unfortunately is developing swelling of the right calf. She does have a positive Homans' sign on exam today, we are to get a stat DVT ultrasound to ensure no the no thrombophlebitis, further follow-up with orthopedic surgeon. Surgery was done by Dr. Dorthula Nettles.

## 2020-03-02 NOTE — Telephone Encounter (Signed)
Right calf pain This is a pleasant 50 year old female approximately 6 weeks post right knee arthroplasty, unfortunately is developing swelling of the right calf. She does have a positive Homans' sign on exam today, we are to get a stat DVT ultrasound to ensure no the no thrombophlebitis, further follow-up with orthopedic surgeon. Surgery was done by Dr. Dorthula Nettles.

## 2020-03-03 ENCOUNTER — Other Ambulatory Visit: Payer: No Typology Code available for payment source

## 2020-03-04 ENCOUNTER — Ambulatory Visit (INDEPENDENT_AMBULATORY_CARE_PROVIDER_SITE_OTHER): Payer: No Typology Code available for payment source

## 2020-03-04 ENCOUNTER — Other Ambulatory Visit: Payer: Self-pay

## 2020-03-04 DIAGNOSIS — M79661 Pain in right lower leg: Secondary | ICD-10-CM | POA: Diagnosis not present

## 2020-03-07 MED FILL — VERAPAMIL ER PM 100 MG CAP: 100 | 90 days supply | Qty: 180 | Fill #3

## 2020-03-16 MED FILL — VERAPAMIL ER PM 100 MG CAP: 100 | 90 days supply | Qty: 180 | Fill #3

## 2020-03-25 ENCOUNTER — Other Ambulatory Visit (HOSPITAL_COMMUNITY): Payer: Self-pay | Admitting: Family Medicine

## 2020-03-25 MED FILL — DULOXETINE HCL 60 MG CPEP: 60 | 30 days supply | Qty: 60 | Fill #0

## 2020-03-25 MED FILL — BUPROPION HCL SR 200 MG TAB: 200 | 30 days supply | Qty: 60 | Fill #0

## 2020-03-29 ENCOUNTER — Other Ambulatory Visit (HOSPITAL_BASED_OUTPATIENT_CLINIC_OR_DEPARTMENT_OTHER): Payer: Self-pay | Admitting: Family Medicine

## 2020-03-31 ENCOUNTER — Other Ambulatory Visit: Payer: Self-pay

## 2020-03-31 ENCOUNTER — Encounter (INDEPENDENT_AMBULATORY_CARE_PROVIDER_SITE_OTHER): Payer: Self-pay | Admitting: Family Medicine

## 2020-03-31 ENCOUNTER — Other Ambulatory Visit (INDEPENDENT_AMBULATORY_CARE_PROVIDER_SITE_OTHER): Payer: Self-pay | Admitting: Family Medicine

## 2020-03-31 ENCOUNTER — Ambulatory Visit (INDEPENDENT_AMBULATORY_CARE_PROVIDER_SITE_OTHER): Payer: No Typology Code available for payment source | Admitting: Family Medicine

## 2020-03-31 VITALS — BP 122/77 | HR 106 | Temp 97.8°F | Ht 66.0 in | Wt 233.0 lb

## 2020-03-31 DIAGNOSIS — E559 Vitamin D deficiency, unspecified: Secondary | ICD-10-CM | POA: Diagnosis not present

## 2020-03-31 DIAGNOSIS — E8881 Metabolic syndrome: Secondary | ICD-10-CM

## 2020-03-31 DIAGNOSIS — E88819 Insulin resistance, unspecified: Secondary | ICD-10-CM

## 2020-03-31 DIAGNOSIS — Z6837 Body mass index (BMI) 37.0-37.9, adult: Secondary | ICD-10-CM

## 2020-03-31 MED ORDER — SAXENDA 18 MG/3ML ~~LOC~~ SOPN: 3.0000 mg | PEN_INJECTOR | Freq: Every day | SUBCUTANEOUS | 0 refills | Status: DC

## 2020-03-31 MED ORDER — VITAMIN D (ERGOCALCIFEROL) 1.25 MG (50000 UNIT) PO CAPS: 50000.0000 [IU] | ORAL_CAPSULE | ORAL | 0 refills | Status: DC

## 2020-03-31 MED FILL — VIT D2 1.25 MG (50,000 UNIT: 1.25 MG | 28 days supply | Qty: 4 | Fill #0

## 2020-04-04 DIAGNOSIS — E8881 Metabolic syndrome: Secondary | ICD-10-CM | POA: Insufficient documentation

## 2020-04-04 DIAGNOSIS — E88819 Insulin resistance, unspecified: Secondary | ICD-10-CM | POA: Insufficient documentation

## 2020-04-04 NOTE — Progress Notes (Signed)
Chief Complaint:   OBESITY Laura Brown is here to discuss her progress with her obesity treatment plan along with follow-up of her obesity related diagnoses. [name] is on keeping a food journal and adhering to recommended goals of 1200-1400 calories and 80 grams of protein and states she is following her eating plan approximately 90% of the time. [name] states she is not exercising regularly at this time.  Today's visit was #: 37 Starting weight: 296 lbs Starting date: 11/07/2017 Today's weight: 233 lbs Today's date: 03/31/2020 Total lbs lost to date: 63 lbs Total lbs lost since last in-office visit: 15 lbs  Interim History: Laura Brown has not had an office visit since 01/05/2020 and she is down 15 pounds since then.  She had a right TKR on 01/19/2020.  She was determined to stick to the plan.  She journals consistently and keeps calories and protein at goal.  She feels she now has restriction with her sleeve gastrectomy and is unable to eat large portions.  She is on Saxenda 2.4 mg.  She was off [place] when she had surgery and has titrated back up.  Subjective:   1. Vitamin D deficiency Had vitamin D level done 2 weeks ago but does not know results.  She is on prescription and OTC vitamin D.  2. Insulin resistance Denies polyphagia.  On Saxenda.  Lab Results  Component Value Date   INSULIN 8.6 07/15/2019   INSULIN 9.7 03/25/2019   INSULIN 6.1 11/26/2018   INSULIN 6.7 11/07/2017   Lab Results  Component Value Date   HGBA1C 4.6 (L) 07/15/2019   Assessment/Plan:   1. Vitamin D deficiency Will refill vitain D 50,000 IU weekly, as per below.  She will send vitamin D results.  - Refill Vitamin D, Ergocalciferol, (DRISDOL) 1.25 MG (50000 UNIT) CAPS capsule; Take 1 capsule (50,000 Units total) by mouth every 7 (seven) days.  Dispense: 12 capsule; Refill: 0  2. Insulin resistance Continue Saxenda.  Will refill today.  3. Class 2 severe obesity with serious comorbidity and body mass  index (BMI) of 37.0 to 37.9 in adult, unspecified obesity type (HCC)  Continue Saxenda 2.4 mg subcutaneously daily.  Will refill today, as per below.  - Refill Liraglutide -Weight Management (SAXENDA) 18 MG/3ML SOPN; Inject 3 mg into the skin daily.  Dispense: 15 mL; Refill: 0  Doralene is currently in the action stage of change. As such, her goal is to continue with weight loss efforts. She has agreed to keeping a food journal and adhering to recommended goals of 1200-1400 calories and grams of protein.   Exercise goals: Walk 20 minutes 3 times per week.  Behavioral modification strategies: increasing lean protein intake.  Laura Brown has agreed to follow-up with our clinic in 4 weeks.   Objective:   Blood pressure 122/77, pulse (!) 106, temperature 97.8 F (36.6 C), height 5\' 6"  (1.676 m), weight 233 lb (105.7 kg), SpO2 99 %. Body mass index is 37.61 kg/m.  General: Cooperative, alert, well developed, in no acute distress. HEENT: Conjunctivae and lids unremarkable. Cardiovascular: Regular rhythm.  Lungs: Normal work of breathing. Neurologic: No focal deficits.   Lab Results  Component Value Date   CREATININE 0.64 01/20/2020   BUN 8 01/20/2020   NA 136 01/20/2020   K 4.0 01/20/2020   CL 102 01/20/2020   CO2 25 01/20/2020   Lab Results  Component Value Date   ALT 10 07/15/2019   AST 13 07/15/2019   ALKPHOS 67 07/15/2019  BILITOT 0.3 07/15/2019   Lab Results  Component Value Date   HGBA1C 4.6 (L) 07/15/2019   HGBA1C 4.6 (L) 03/25/2019   HGBA1C 4.7 (L) 11/26/2018   HGBA1C 4.9 11/07/2017   Lab Results  Component Value Date   INSULIN 8.6 07/15/2019   INSULIN 9.7 03/25/2019   INSULIN 6.1 11/26/2018   INSULIN 6.7 11/07/2017   Lab Results  Component Value Date   TSH 0.984 11/07/2017   Lab Results  Component Value Date   CHOL 165 11/26/2018   HDL 62 11/26/2018   LDLCALC 94 11/26/2018   TRIG 44 11/26/2018   Lab Results  Component Value Date   WBC 11.3 (H)  01/20/2020   HGB 12.4 01/20/2020   HCT 39.3 01/20/2020   MCV 95.6 01/20/2020   PLT 318 01/20/2020   Lab Results  Component Value Date   FERRITIN 32 03/22/2011   Attestation Statements:   Reviewed by clinician on day of visit: allergies, medications, problem list, medical history, surgical history, family history, social history, and previous encounter notes.  I, Insurance claims handler, CMA, am acting as Energy manager for [place], FNP.  I have reviewed the above documentation for accuracy and completeness, and I agree with the above. -  Jesse Sans, FNP

## 2020-04-05 ENCOUNTER — Encounter (INDEPENDENT_AMBULATORY_CARE_PROVIDER_SITE_OTHER): Payer: Self-pay

## 2020-04-07 ENCOUNTER — Encounter (INDEPENDENT_AMBULATORY_CARE_PROVIDER_SITE_OTHER): Payer: Self-pay | Admitting: Family Medicine

## 2020-04-07 MED FILL — OTEZLA 30 MG TABS: 30 | 30 days supply | Qty: 60 | Fill #0

## 2020-04-07 MED FILL — SAXENDA 18 MG/3 ML PEN: 18 | 30 days supply | Qty: 15 | Fill #0

## 2020-04-07 NOTE — Telephone Encounter (Signed)
FYI

## 2020-04-26 ENCOUNTER — Other Ambulatory Visit: Payer: Self-pay | Admitting: Pharmacist

## 2020-04-26 MED ORDER — OTEZLA 30 MG PO TABS: ORAL_TABLET | ORAL | 1 refills | Status: DC

## 2020-04-27 ENCOUNTER — Encounter (INDEPENDENT_AMBULATORY_CARE_PROVIDER_SITE_OTHER): Payer: Self-pay | Admitting: Family Medicine

## 2020-04-27 ENCOUNTER — Other Ambulatory Visit: Payer: Self-pay

## 2020-04-27 ENCOUNTER — Ambulatory Visit (INDEPENDENT_AMBULATORY_CARE_PROVIDER_SITE_OTHER): Payer: No Typology Code available for payment source | Admitting: Family Medicine

## 2020-04-27 ENCOUNTER — Other Ambulatory Visit (INDEPENDENT_AMBULATORY_CARE_PROVIDER_SITE_OTHER): Payer: Self-pay | Admitting: Family Medicine

## 2020-04-27 VITALS — BP 120/77 | HR 105 | Temp 97.9°F | Ht 66.0 in | Wt 223.0 lb

## 2020-04-27 DIAGNOSIS — Z9189 Other specified personal risk factors, not elsewhere classified: Secondary | ICD-10-CM | POA: Diagnosis not present

## 2020-04-27 DIAGNOSIS — Z6841 Body Mass Index (BMI) 40.0 and over, adult: Secondary | ICD-10-CM

## 2020-04-27 DIAGNOSIS — E66813 Obesity, class 3: Secondary | ICD-10-CM

## 2020-04-27 DIAGNOSIS — E559 Vitamin D deficiency, unspecified: Secondary | ICD-10-CM | POA: Diagnosis not present

## 2020-04-27 DIAGNOSIS — F3289 Other specified depressive episodes: Secondary | ICD-10-CM | POA: Diagnosis not present

## 2020-04-27 MED ORDER — VITAMIN D (ERGOCALCIFEROL) 1.25 MG (50000 UNIT) PO CAPS: 50000.0000 [IU] | ORAL_CAPSULE | ORAL | 0 refills | Status: DC

## 2020-04-28 ENCOUNTER — Ambulatory Visit (INDEPENDENT_AMBULATORY_CARE_PROVIDER_SITE_OTHER): Payer: No Typology Code available for payment source | Admitting: Family Medicine

## 2020-04-28 ENCOUNTER — Other Ambulatory Visit (HOSPITAL_COMMUNITY): Payer: Self-pay

## 2020-04-30 ENCOUNTER — Other Ambulatory Visit (HOSPITAL_COMMUNITY): Payer: Self-pay

## 2020-04-30 MED FILL — Apremilast Tab 30 MG: ORAL | 30 days supply | Qty: 60 | Fill #0 | Status: AC

## 2020-05-01 ENCOUNTER — Other Ambulatory Visit (HOSPITAL_COMMUNITY): Payer: Self-pay

## 2020-05-02 ENCOUNTER — Encounter (INDEPENDENT_AMBULATORY_CARE_PROVIDER_SITE_OTHER): Payer: Self-pay | Admitting: Family Medicine

## 2020-05-02 NOTE — Progress Notes (Signed)
Chief Complaint:   OBESITY Laura Brown is here to discuss her progress with her obesity treatment plan along with follow-up of her obesity related diagnoses. Laura Brown is on keeping a food journal and adhering to recommended goals of 1200-1400 calories and 80 protein and states she is following her eating plan approximately 90% of the time. Laura Brown states she is walking 20 minutes 3 times per week.  Today's visit was #: 38 Starting weight: 296 lbs Starting date: 11/07/2017 Today's weight: 223 lbs Today's date: 04/27/2020 Total lbs lost to date: 73 lbs Total lbs lost since last in-office visit: 10 lbs  Interim History: Laura Brown is on Saxenda 2.4 mg and her appetite is well controlled. She will have left knee replaced in June 2022. She is journaling daily but struggles with meeting protein goal. She reports constipation and denies nausea. She is taking a stool softener.  Subjective:   1. Vitamin D deficiency Laura Brown's Vitamin D level was 49 on 03/27/2020. She is currently taking prescription vitamin D 50,000 IU each week and OTC Vit D 2,000 IU daily.   2. Other depression, with emotional eating Laura Brown not always take her 2nd dose of Wellbutrin (prescribed 200 mg). She feels sweets cravings are well controlled.   3. At risk for impaired metabolic function Laura Brown is at increased risk for impaired metabolic function due to decreased muscle mass.  Assessment/Plan:   1. Vitamin D deficiency She agrees to continue to take prescription Vitamin D @50 ,000 IU every week and OTC Vit D 2,000 IU daily and will follow-up for routine testing of Vitamin D, at least 2-3 times per year to avoid over-replacement.  2. Other depression, with emotional eating  Continue Wellbutrin 200 mg QD or BID.  3. At risk for impaired metabolic function Laura Brown was given approximately 15 minutes of impaired  metabolic function prevention counseling today. We discussed intensive lifestyle modifications today with an  emphasis on specific nutrition and exercise instructions and strategies.   Repetitive spaced learning was employed today to elicit superior memory formation and behavioral change.  4. Obesity: BMI 36 Laura Brown is currently in the action stage of change. As such, her goal is to continue with weight loss efforts. She has agreed to keeping a food journal and adhering to recommended goals of 1200-1400 calories and 80 g protein.   Continue Saxenda 2.4 mg. Increase protein at all meals- suggested 4 oz at lunch and 6 oz at supper.  Exercise goals: As is  Behavioral modification strategies: increasing lean protein intake, meal planning and cooking strategies and better snacking choices.  Laura Brown has agreed to follow-up with our clinic in 4 weeks.   Objective:   Blood pressure 120/77, pulse (!) 105, temperature 97.9 F (36.6 C), height 5\' 6"  (1.676 m), weight 223 lb (101.2 kg), SpO2 100 %. Body mass index is 35.99 kg/m.  General: Cooperative, alert, well developed, in no acute distress. HEENT: Conjunctivae and lids unremarkable. Cardiovascular: Regular rhythm.  Lungs: Normal work of breathing. Neurologic: No focal deficits.   Lab Results  Component Value Date   CREATININE 0.64 01/20/2020   BUN 8 01/20/2020   NA 136 01/20/2020   K 4.0 01/20/2020   CL 102 01/20/2020   CO2 25 01/20/2020   Lab Results  Component Value Date   ALT 10 07/15/2019   AST 13 07/15/2019   ALKPHOS 67 07/15/2019   BILITOT 0.3 07/15/2019   Lab Results  Component Value Date   HGBA1C 4.6 (L) 07/15/2019   HGBA1C  4.6 (L) 03/25/2019   HGBA1C 4.7 (L) 11/26/2018   HGBA1C 4.9 11/07/2017   Lab Results  Component Value Date   INSULIN 8.6 07/15/2019   INSULIN 9.7 03/25/2019   INSULIN 6.1 11/26/2018   INSULIN 6.7 11/07/2017   Lab Results  Component Value Date   TSH 0.984 11/07/2017   Lab Results  Component Value Date   CHOL 165 11/26/2018   HDL 62 11/26/2018   LDLCALC 94 11/26/2018   TRIG 44 11/26/2018    Lab Results  Component Value Date   WBC 11.3 (H) 01/20/2020   HGB 12.4 01/20/2020   HCT 39.3 01/20/2020   MCV 95.6 01/20/2020   PLT 318 01/20/2020   Lab Results  Component Value Date   FERRITIN 32 03/22/2011    Attestation Statements:   Reviewed by clinician on day of visit: allergies, medications, problem list, medical history, surgical history, family history, social history, and previous encounter notes.  Edmund Hilda, am acting as Energy manager for [place], FNP.  I have reviewed the above documentation for accuracy and completeness, and I agree with the above. -  Jesse Sans, FNP

## 2020-05-03 ENCOUNTER — Other Ambulatory Visit (HOSPITAL_COMMUNITY): Payer: Self-pay

## 2020-05-03 ENCOUNTER — Other Ambulatory Visit: Payer: Self-pay | Admitting: Pharmacist

## 2020-05-03 MED ORDER — OTEZLA 30 MG PO TABS
30.0000 mg | ORAL_TABLET | Freq: Two times a day (BID) | ORAL | 1 refills | Status: DC
  Filled 2020-05-03 – 2020-06-08 (×4): qty 60, 30d supply, fill #0
  Filled 2020-07-01: qty 60, 30d supply, fill #1

## 2020-05-06 ENCOUNTER — Other Ambulatory Visit (HOSPITAL_BASED_OUTPATIENT_CLINIC_OR_DEPARTMENT_OTHER): Payer: Self-pay

## 2020-05-06 ENCOUNTER — Other Ambulatory Visit (HOSPITAL_COMMUNITY): Payer: Self-pay

## 2020-05-06 MED FILL — Duloxetine HCl Enteric Coated Pellets Cap 60 MG (Base Eq): ORAL | 90 days supply | Qty: 180 | Fill #0 | Status: CN

## 2020-05-06 MED FILL — Duloxetine HCl Enteric Coated Pellets Cap 60 MG (Base Eq): ORAL | 90 days supply | Qty: 180 | Fill #0 | Status: AC

## 2020-05-11 ENCOUNTER — Other Ambulatory Visit (HOSPITAL_BASED_OUTPATIENT_CLINIC_OR_DEPARTMENT_OTHER): Payer: Self-pay

## 2020-05-11 MED FILL — Ergocalciferol Cap 1.25 MG (50000 Unit): ORAL | 84 days supply | Qty: 12 | Fill #0 | Status: AC

## 2020-05-25 ENCOUNTER — Other Ambulatory Visit (HOSPITAL_COMMUNITY): Payer: Self-pay

## 2020-05-25 ENCOUNTER — Other Ambulatory Visit: Payer: Self-pay

## 2020-05-25 ENCOUNTER — Encounter (INDEPENDENT_AMBULATORY_CARE_PROVIDER_SITE_OTHER): Payer: Self-pay | Admitting: Family Medicine

## 2020-05-25 ENCOUNTER — Ambulatory Visit (INDEPENDENT_AMBULATORY_CARE_PROVIDER_SITE_OTHER): Payer: No Typology Code available for payment source | Admitting: Family Medicine

## 2020-05-25 ENCOUNTER — Other Ambulatory Visit (HOSPITAL_BASED_OUTPATIENT_CLINIC_OR_DEPARTMENT_OTHER): Payer: Self-pay

## 2020-05-25 VITALS — BP 112/70 | HR 76 | Temp 97.4°F | Ht 66.0 in | Wt 216.0 lb

## 2020-05-25 DIAGNOSIS — R632 Polyphagia: Secondary | ICD-10-CM

## 2020-05-25 DIAGNOSIS — Z6841 Body Mass Index (BMI) 40.0 and over, adult: Secondary | ICD-10-CM | POA: Diagnosis not present

## 2020-05-25 DIAGNOSIS — E559 Vitamin D deficiency, unspecified: Secondary | ICD-10-CM | POA: Diagnosis not present

## 2020-05-25 MED ORDER — VITAMIN D (ERGOCALCIFEROL) 1.25 MG (50000 UNIT) PO CAPS
ORAL_CAPSULE | ORAL | 0 refills | Status: DC
  Filled 2020-05-25 – 2020-08-04 (×2): qty 12, 84d supply, fill #0

## 2020-05-25 MED ORDER — LIRAGLUTIDE -WEIGHT MANAGEMENT 18 MG/3ML ~~LOC~~ SOPN
3.0000 mg | PEN_INJECTOR | Freq: Every day | SUBCUTANEOUS | 0 refills | Status: DC
  Filled 2020-05-25: qty 15, 30d supply, fill #0

## 2020-05-26 NOTE — Progress Notes (Signed)
Chief Complaint:   OBESITY Leonard is here to discuss her progress with her obesity treatment plan along with follow-up of her obesity related diagnoses. Mckynna is on keeping a food journal and adhering to recommended goals of 1200-1400 calories and 80 grams of protein daily and states she is following her eating plan approximately 90% of the time. Georgeanne states she is walks for 20 minutes 3 times per week.  Today's visit was #: 39 Starting weight: 296 lbs Starting date: 11/07/2017 Today's weight: 216 lbs Today's date: 05/25/2020 Total lbs lost to date: 80 Total lbs lost since last in-office visit: 7  Interim History: Jude is doing very well on the plan and is down 7 lbs. She is planning for left total knee replacement in June 2022. She is journaling consistently and hitting her protein goals most days. She is on Saxenda 3.0 mg.  Subjective:   1. Polyphagia Amesha notes her appetite is well controlled. She is on Saxenda 3.0 mg.  2. Vitamin D deficiency Richel's Vit D is nearly at goal (49 on 03/27/2020 at her PCP). She is on weekly prescription Vit D.  Assessment/Plan:   1. Polyphagia  We will refill Saxenda 3.0 mg weekly for 1 month.  - Liraglutide -Weight Management 18 MG/3ML SOPN; INJECT 3 MG INTO THE SKIN DAILY.  Dispense: 15 mL; Refill: 0  2. Vitamin D deficiency  We will refill prescription Vitamin D for 90 days with no refills. Kayde will follow-up for routine testing of Vitamin D, at least 2-3 times per year to avoid over-replacement.  - Vitamin D, Ergocalciferol, (DRISDOL) 1.25 MG (50000 UNIT) CAPS capsule; TAKE 1 CAPSULE (50,000 UNITS TOTAL) BY MOUTH EVERY 7 (SEVEN) DAYS.  Dispense: 12 capsule; Refill: 0  3. Obesity: BMI 34.88 Ravneet is currently in the action stage of change. As such, her goal is to continue with weight loss efforts. She has agreed to keeping a food journal and adhering to recommended goals of 1200-1400 calories and 80 grams of protein daily.    Exercise goals: As is, and consider adding resistance 2 times per week.  Behavioral modification strategies: planning for success.  Artemisia has agreed to follow-up with our clinic in 4 weeks.   Objective:   Blood pressure 112/70, pulse 76, temperature (!) 97.4 F (36.3 C), height 5\' 6"  (1.676 m), weight 216 lb (98 kg), SpO2 100 %. Body mass index is 34.86 kg/m.  General: Cooperative, alert, well developed, in no acute distress. HEENT: Conjunctivae and lids unremarkable. Cardiovascular: Regular rhythm.  Lungs: Normal work of breathing. Neurologic: No focal deficits.   Lab Results  Component Value Date   CREATININE 0.64 01/20/2020   BUN 8 01/20/2020   NA 136 01/20/2020   K 4.0 01/20/2020   CL 102 01/20/2020   CO2 25 01/20/2020   Lab Results  Component Value Date   ALT 10 07/15/2019   AST 13 07/15/2019   ALKPHOS 67 07/15/2019   BILITOT 0.3 07/15/2019   Lab Results  Component Value Date   HGBA1C 4.6 (L) 07/15/2019   HGBA1C 4.6 (L) 03/25/2019   HGBA1C 4.7 (L) 11/26/2018   HGBA1C 4.9 11/07/2017   Lab Results  Component Value Date   INSULIN 8.6 07/15/2019   INSULIN 9.7 03/25/2019   INSULIN 6.1 11/26/2018   INSULIN 6.7 11/07/2017   Lab Results  Component Value Date   TSH 0.984 11/07/2017   Lab Results  Component Value Date   CHOL 165 11/26/2018   HDL 62 11/26/2018  LDLCALC 94 11/26/2018   TRIG 44 11/26/2018   Lab Results  Component Value Date   WBC 11.3 (H) 01/20/2020   HGB 12.4 01/20/2020   HCT 39.3 01/20/2020   MCV 95.6 01/20/2020   PLT 318 01/20/2020   Lab Results  Component Value Date   FERRITIN 32 03/22/2011   Attestation Statements:   Reviewed by clinician on day of visit: allergies, medications, problem list, medical history, surgical history, family history, social history, and previous encounter notes.   Trude Mcburney, am acting as Energy manager for [place], FNP-C.  I have reviewed the above documentation for accuracy and  completeness, and I agree with the above. -  Jesse Sans, FNP

## 2020-05-27 ENCOUNTER — Other Ambulatory Visit (HOSPITAL_BASED_OUTPATIENT_CLINIC_OR_DEPARTMENT_OTHER): Payer: Self-pay

## 2020-05-30 ENCOUNTER — Other Ambulatory Visit (HOSPITAL_COMMUNITY): Payer: Self-pay

## 2020-05-31 ENCOUNTER — Encounter (INDEPENDENT_AMBULATORY_CARE_PROVIDER_SITE_OTHER): Payer: Self-pay | Admitting: Family Medicine

## 2020-06-01 ENCOUNTER — Other Ambulatory Visit (HOSPITAL_BASED_OUTPATIENT_CLINIC_OR_DEPARTMENT_OTHER): Payer: Self-pay

## 2020-06-03 ENCOUNTER — Other Ambulatory Visit (HOSPITAL_BASED_OUTPATIENT_CLINIC_OR_DEPARTMENT_OTHER): Payer: Self-pay

## 2020-06-06 ENCOUNTER — Other Ambulatory Visit (HOSPITAL_BASED_OUTPATIENT_CLINIC_OR_DEPARTMENT_OTHER): Payer: Self-pay

## 2020-06-08 ENCOUNTER — Other Ambulatory Visit (HOSPITAL_COMMUNITY): Payer: Self-pay

## 2020-06-08 ENCOUNTER — Other Ambulatory Visit (HOSPITAL_BASED_OUTPATIENT_CLINIC_OR_DEPARTMENT_OTHER): Payer: Self-pay

## 2020-06-09 ENCOUNTER — Other Ambulatory Visit (HOSPITAL_BASED_OUTPATIENT_CLINIC_OR_DEPARTMENT_OTHER): Payer: Self-pay

## 2020-06-14 ENCOUNTER — Other Ambulatory Visit (HOSPITAL_BASED_OUTPATIENT_CLINIC_OR_DEPARTMENT_OTHER): Payer: Self-pay

## 2020-06-15 ENCOUNTER — Other Ambulatory Visit (HOSPITAL_BASED_OUTPATIENT_CLINIC_OR_DEPARTMENT_OTHER): Payer: Self-pay

## 2020-06-15 ENCOUNTER — Other Ambulatory Visit (HOSPITAL_COMMUNITY): Payer: Self-pay

## 2020-06-16 ENCOUNTER — Other Ambulatory Visit (HOSPITAL_BASED_OUTPATIENT_CLINIC_OR_DEPARTMENT_OTHER): Payer: Self-pay

## 2020-06-16 MED ORDER — VERAPAMIL HCL ER 100 MG PO CP24
ORAL_CAPSULE | ORAL | 0 refills | Status: DC
  Filled 2020-06-16: qty 180, 90d supply, fill #0

## 2020-06-22 ENCOUNTER — Ambulatory Visit (INDEPENDENT_AMBULATORY_CARE_PROVIDER_SITE_OTHER): Payer: No Typology Code available for payment source | Admitting: Family Medicine

## 2020-06-22 ENCOUNTER — Other Ambulatory Visit (HOSPITAL_BASED_OUTPATIENT_CLINIC_OR_DEPARTMENT_OTHER): Payer: Self-pay

## 2020-06-22 ENCOUNTER — Encounter (INDEPENDENT_AMBULATORY_CARE_PROVIDER_SITE_OTHER): Payer: Self-pay | Admitting: Family Medicine

## 2020-06-22 DIAGNOSIS — F3289 Other specified depressive episodes: Secondary | ICD-10-CM

## 2020-06-22 DIAGNOSIS — R632 Polyphagia: Secondary | ICD-10-CM

## 2020-06-22 MED ORDER — BUPROPION HCL ER (SR) 200 MG PO TB12
200.0000 mg | ORAL_TABLET | Freq: Two times a day (BID) | ORAL | 1 refills | Status: DC
  Filled 2020-06-22: qty 180, 90d supply, fill #0

## 2020-06-22 MED ORDER — LIRAGLUTIDE -WEIGHT MANAGEMENT 18 MG/3ML ~~LOC~~ SOPN
3.0000 mg | PEN_INJECTOR | Freq: Every day | SUBCUTANEOUS | 0 refills | Status: DC
  Filled 2020-06-22: qty 15, 30d supply, fill #0

## 2020-06-22 MED ORDER — BUPROPION HCL ER (SR) 200 MG PO TB12
ORAL_TABLET | Freq: Two times a day (BID) | ORAL | 1 refills | Status: DC
  Filled 2020-06-22: qty 180, fill #0

## 2020-06-22 NOTE — Addendum Note (Signed)
Addended by: Scarlett Presto on: 06/22/2020 04:03 PM   Modules accepted: Orders

## 2020-06-29 ENCOUNTER — Other Ambulatory Visit (HOSPITAL_COMMUNITY): Payer: Self-pay

## 2020-07-01 ENCOUNTER — Other Ambulatory Visit (HOSPITAL_COMMUNITY): Payer: Self-pay

## 2020-07-05 ENCOUNTER — Other Ambulatory Visit (HOSPITAL_COMMUNITY): Payer: Self-pay

## 2020-07-12 NOTE — Patient Instructions (Addendum)
DUE TO COVID-19 ONLY ONE VISITOR IS ALLOWED TO COME WITH YOU AND STAY IN THE WAITING ROOM ONLY DURING PRE OP AND PROCEDURE.   **NO VISITORS ARE ALLOWED IN THE SHORT STAY AREA OR RECOVERY ROOM!!**  IF YOU WILL BE ADMITTED INTO THE HOSPITAL YOU ARE ALLOWED ONLY TWO SUPPORT PEOPLE DURING VISITATION HOURS ONLY (10AM -8PM)   The support person(s) may change daily. The support person(s) must pass our screening, gel in and out, and wear a mask at all times, including in the patient's room. Patients must also wear a mask when staff or their support person are in the room.  No visitors under the age of 52. Any visitor under the age of 52 must be accompanied by an adult.   Get your covid test on 6/24 at  2:50pm        Your procedure is scheduled on: 07/26/20   [facility]   Report to Short Stay at 5:15 AM   [facility](Map Enclosed)       Call this number if you have problems the morning of surgery [phone]   Do not eat food :After Midnight.   May have liquids until  4:30 am  day of surgery  CLEAR LIQUID DIET  Foods Allowed                                                                     Foods Excluded  Water, Black Coffee and tea, regular and decaf                             liquids that you cannot  Plain Jell-O in any flavor  (No red)                                           see through such as: Fruit ices (not with fruit pulp)                                     milk, soups, orange juice              Iced Popsicles (No red)                                    All solid food                                   Apple juices Sports drinks like Gatorade (No red) Lightly seasoned clear broth or consume(fat free) Sugar, honey syrup              The day of surgery:  Drink ONE (1) Pre-Surgery Clear Ensure or G2 by  4:00 am the morning of surgery. Drink in one sitting. Do not sip.  This drink was given to you during your hospital  pre-op appointment  visit. Nothing else to drink  after completing the  Pre-Surgery Clear Ensure or G2.          If you have questions, please contact your surgeon's office.     Oral Hygiene is also important to reduce your risk of infection.                                    Remember - BRUSH YOUR TEETH THE MORNING OF SURGERY WITH YOUR REGULAR TOOTHPASTE   Do NOT smoke after Midnight   Take these medicines the morning of surgery with A SIP OF WATER: Wellbutrin, Cymbalta                                You may not have any metal on your body including hair pins, jewelry, and body piercing             Do not wear make-up, lotions, powders, perfumes/cologne, or deodorant  Do not wear nail polish including gel and S&S, artificial/acrylic nails, or any other type of covering on natural nails including finger and toenails. If you have artificial nails, gel coating, etc. that needs to be removed by a nail salon please have this removed prior to surgery or surgery may need to be canceled/ delayed if the surgeon/ anesthesia feels like they are unable to be safely monitored.   Do not shave  48 hours prior to surgery.               Do not bring valuables to the hospital. [facility] IS NOT             RESPONSIBLE   FOR VALUABLES.   Contacts, dentures or bridgework may not be worn into surgery.   Bring small overnight bag day of surgery.    Patients discharged the day of surgery will not be allowed to drive home.   Special Instructions: Bring a copy of your healthcare power of attorney and living will documents  the day of surgery if you haven't scanned them in before.              Please read over the following fact sheets you were given: IF YOU HAVE QUESTIONS ABOUT YOUR PRE OP INSTRUCTIONS PLEASE CALL [phone]   [place] - Preparing for Surgery Before surgery, you can play an important role.  Because skin is not sterile, your skin needs to be as free of germs as possible.  You can reduce the number  of germs on your skin by washing with CHG (chlorahexidine gluconate) soap before surgery.  CHG is an antiseptic cleaner which kills germs and bonds with the skin to continue killing germs even after washing. Please DO NOT use if you have an allergy to CHG or antibacterial soaps.  If your skin becomes reddened/irritated stop using the CHG and inform your nurse when you arrive at Short Stay. Do not shave (including legs and underarms) for at least 48 hours prior to the first CHG shower.    Please follow these instructions carefully:  1.  Shower with CHG Soap the night before surgery and the  morning of Surgery.  2.  If you choose to wash your hair, wash your hair first as usual with your  normal  shampoo.  3.  After you shampoo, rinse your hair and body thoroughly to remove the  shampoo.  4.  Use CHG as you would any other liquid soap.  You can apply chg directly  to the skin and wash                       Gently with a scrungie or clean washcloth.  5.  Apply the CHG Soap to your body ONLY FROM THE NECK DOWN.   Do not use on face/ open                           Wound or open sores. Avoid contact with eyes, ears mouth and genitals (private parts).                       Wash face,  Genitals (private parts) with your normal soap.             6.  Wash thoroughly, paying special attention to the area where your surgery  will be performed.  7.  Thoroughly rinse your body with warm water from the neck down.  8.  DO NOT shower/wash with your normal soap after using and rinsing off  the CHG Soap.             9.  Pat yourself dry with a clean towel.            10.  Wear clean pajamas.            11.  Place clean sheets on your bed the night of your first shower and do not  sleep with pets. Day of Surgery : Do not apply any lotions/deodorants the morning of surgery.  Please wear clean clothes to the hospital/surgery center.  FAILURE TO FOLLOW THESE INSTRUCTIONS MAY  RESULT IN THE CANCELLATION OF YOUR SURGERY PATIENT SIGNATURE_________________________________  NURSE SIGNATURE__________________________________  ________________________________________________________________________   Rogelia Mire  An incentive spirometer is a tool that can help keep your lungs clear and active. This tool measures how well you are filling your lungs with each breath. Taking long deep breaths may help reverse or decrease the chance of developing breathing (pulmonary) problems (especially infection) following: A long period of time when you are unable to move or be active. BEFORE THE PROCEDURE  If the spirometer includes an indicator to show your best effort, your nurse or respiratory therapist will set it to a desired goal. If possible, sit up straight or lean slightly forward. Try not to slouch. Hold the incentive spirometer in an upright position. INSTRUCTIONS FOR USE  Sit on the edge of your bed if possible, or sit up as far as you can in bed or on a chair. Hold the incentive spirometer in an upright position. Breathe out normally. Place the mouthpiece in your mouth and seal your lips tightly around it. Breathe in slowly and as deeply as possible, raising the piston or the ball toward the top of the column. Hold your breath for 3-5 seconds or for as long as possible. Allow the piston or ball to fall to the bottom of the column. Remove the mouthpiece from your mouth and breathe out normally. Rest for a few seconds and repeat Steps 1 through 7 at least 10 times every 1-2 hours when you are awake. Take your time and take a few normal breaths between deep breaths. The spirometer may include an indicator to show your best effort. Use the indicator as a goal to work toward during each repetition. After  each set of 10 deep breaths, practice coughing to be sure your lungs are clear. If you have an incision (the cut made at the time of surgery), support your incision  when coughing by placing a pillow or rolled up towels firmly against it. Once you are able to get out of bed, walk around indoors and cough well. You may stop using the incentive spirometer when instructed by your caregiver.  RISKS AND COMPLICATIONS Take your time so you do not get dizzy or light-headed. If you are in pain, you may need to take or ask for pain medication before doing incentive spirometry. It is harder to take a deep breath if you are having pain. AFTER USE Rest and breathe slowly and easily. It can be helpful to keep track of a log of your progress. Your caregiver can provide you with a simple table to help with this. If you are using the spirometer at home, follow these instructions: SEEK MEDICAL CARE IF:  You are having difficultly using the spirometer. You have trouble using the spirometer as often as instructed. Your pain medication is not giving enough relief while using the spirometer. You develop fever of 100.5 F (38.1 C) or higher. SEEK IMMEDIATE MEDICAL CARE IF:  You cough up bloody sputum that had not been present before. You develop fever of 102 F (38.9 C) or greater. You develop worsening pain at or near the incision site. MAKE SURE YOU:  Understand these instructions. Will watch your condition. Will get help right away if you are not doing well or get worse. Document Released: 05/28/2006 Document Revised: 04/09/2011 Document Reviewed: 07/29/2006 [facility] Patient Information 2014 [place], [place].   ________________________________________________________________________

## 2020-07-13 ENCOUNTER — Encounter (HOSPITAL_COMMUNITY): Payer: Self-pay

## 2020-07-13 ENCOUNTER — Encounter (HOSPITAL_COMMUNITY)
Admission: RE | Admit: 2020-07-13 | Discharge: 2020-07-13 | Disposition: A | Discharge status: Home / Self Care | Payer: No Typology Code available for payment source | Source: Ambulatory Visit | Attending: Orthopedic Surgery | Admitting: Orthopedic Surgery

## 2020-07-13 ENCOUNTER — Other Ambulatory Visit: Payer: Self-pay

## 2020-07-13 DIAGNOSIS — Z01812 Encounter for preprocedural laboratory examination: Secondary | ICD-10-CM | POA: Diagnosis present

## 2020-07-13 LAB — CBC
HCT: 39.3 % (ref 36.0–46.0)
Hemoglobin: 12.3 g/dL (ref 12.0–15.0)
MCH: 29.5 pg (ref 26.0–34.0)
MCHC: 31.3 g/dL (ref 30.0–36.0)
MCV: 94.2 fL (ref 80.0–100.0)
Platelets: 318 10*3/uL (ref 150–400)
RBC: 4.17 MIL/uL (ref 3.87–5.11)
RDW: 13.5 % (ref 11.5–15.5)
WBC: 7.8 10*3/uL (ref 4.0–10.5)
nRBC: 0 % (ref 0.0–0.2)

## 2020-07-13 LAB — SURGICAL PCR SCREEN
MRSA, PCR: NEGATIVE
Staphylococcus aureus: NEGATIVE

## 2020-07-13 NOTE — Progress Notes (Addendum)
COVID Vaccine Completed:Yes  Date COVID Vaccine completed:06/30/19 COVID vaccine manufacturer: Pfizer     PCP - Dr. Loreen Freud. LOV 05/25/20 Cardiologist - none  Chest x-ray - no EKG - 01/11/20-epic Stress Test - no ECHO - no Cardiac Cath - no Pacemaker/ICD device last checked:NA  Sleep Study - yes-negative CPAP - no  Fasting Blood Sugar - NA Checks Blood Sugar _____ times a day  Blood Thinner Instructions:NA Aspirin Instructions: Last Dose:  Anesthesia review:   Patient denies shortness of breath, fever, cough and chest pain at PAT appointment Yes. Pt has no SOB with any activities  Patient verbalized understanding of instructions that were given to them at the PAT appointment. Patient was also instructed that they will need to review over the PAT instructions again at home before surgery. yes

## 2020-07-22 ENCOUNTER — Other Ambulatory Visit (HOSPITAL_COMMUNITY)
Admission: RE | Admit: 2020-07-22 | Discharge: 2020-07-22 | Disposition: A | Discharge status: Home / Self Care | Payer: No Typology Code available for payment source | Source: Ambulatory Visit | Attending: Orthopedic Surgery | Admitting: Orthopedic Surgery

## 2020-07-22 DIAGNOSIS — Z20822 Contact with and (suspected) exposure to covid-19: Secondary | ICD-10-CM | POA: Diagnosis not present

## 2020-07-22 DIAGNOSIS — Z01812 Encounter for preprocedural laboratory examination: Secondary | ICD-10-CM | POA: Insufficient documentation

## 2020-07-22 LAB — SARS CORONAVIRUS 2 (TAT 6-24 HRS): SARS Coronavirus 2: NEGATIVE

## 2020-07-25 NOTE — Progress Notes (Addendum)
KNEE ARTHROPLASTY ADMISSION H&P  Patient ID: Laura Brown MRN: [id number] DOB/AGE: [birth year] 50 y.o.  Chief Complaint: left knee pain.  Planned Procedure Date: 07/26/20 Medical Clearance by Dr. Duanne Guess     HPI: Laura Brown is a 50 y.o. female who presents for evaluation of djd left knee. The patient has a history of pain and functional disability in the left knee due to arthritis and has failed non-surgical conservative treatments for greater than 12 weeks to include NSAID's and/or analgesics, corticosteriod injections, weight reduction as appropriate, and activity modification.  Onset of symptoms was gradual, starting 2 years ago with rapidlly worsening course since that time. The patient noted no past surgery on the left knee.  Patient currently rates pain at 5 out of 10 with activity. Patient has worsening of pain with activity and weight bearing and pain that interferes with activities of daily living.  Patient has evidence of joint space narrowing by imaging studies.  There is no active infection.  Past Medical History:  Diagnosis Date   Anxiety    Depression    Osteoarthritis of both knees    Past Surgical History:  Procedure Laterality Date   CESAREAN SECTION     LAPAROSCOPIC GASTRIC SLEEVE RESECTION     TOTAL KNEE ARTHROPLASTY Right 01/19/2020   Procedure: TOTAL KNEE ARTHROPLASTY;  Surgeon: Teryl Lucy, MD;  Location: [facility] ORS;  Service: Orthopedics;  Laterality: Right;   No Known Allergies Prior to Admission medications   Medication Sig Start Date End Date Taking? Authorizing Provider  acetaminophen (TYLENOL) 500 MG tablet Take 1,000 mg by mouth every 6 (six) hours as needed for moderate pain.   Yes [provider]  Apremilast (OTEZLA) 30 MG TABS Take 1 tablet (30 mg total) by mouth 2 (two) times daily. 05/03/20  Yes Quentin Angst, MD  buPROPion (WELLBUTRIN SR) 200 MG 12 hr tablet Take 1 tablet (200 mg total) by mouth 2 (two) times daily. 06/22/20  Yes  Whitmire, Dawn W, FNP  cholecalciferol (VITAMIN D3) 25 MCG (1000 UNIT) tablet Take 2,000 Units by mouth daily.   Yes [provider]  DULoxetine (CYMBALTA) 60 MG capsule Take 120 mg by mouth daily.    Yes [provider]  fexofenadine (ALLEGRA) 180 MG tablet Take 1 tablet (180 mg total) by mouth daily. Patient taking differently: Take 180 mg by mouth at bedtime. 10/10/19  Yes Martin, Mary-Margaret, FNP  Liraglutide -Weight Management 18 MG/3ML SOPN INJECT 3 MG INTO THE SKIN DAILY. 06/22/20 06/22/21 Yes Whitmire, Thermon Leyland, FNP  verapamil (VERELAN) 100 MG 24 hr capsule Take 2 capsules by mouth daily. Patient taking differently: Take 200 mg by mouth at bedtime. 06/15/20  Yes   Vitamin D, Ergocalciferol, (DRISDOL) 1.25 MG (50000 UNIT) CAPS capsule TAKE 1 CAPSULE (50,000 UNITS TOTAL) BY MOUTH EVERY 7 (SEVEN) DAYS. Patient taking differently: Take 50,000 Units by mouth every Friday. 05/25/20 05/25/21 Yes Whitmire, Thermon Leyland, FNP  Cholecalciferol (VITAMIN D) 50 MCG (2000 UT) CAPS Take 1 capsule (2,000 Units total) by mouth daily. Patient not taking: Reported on 07/11/2020 09/16/19   Whitmire, Thermon Leyland, FNP  fluticasone (FLONASE) 50 MCG/ACT nasal spray Place 2 sprays into both nostrils daily. Patient not taking: No sig reported 10/10/19   Daphine Deutscher, Mary-Margaret, FNP  Insulin Pen Needle (BD PEN NEEDLE NANO 2ND GEN) 32G X 4 MM MISC 1 Device by Does not apply route daily. 06/03/19   Wilder Glade, MD   Social History   Socioeconomic History  Marital status: Divorced    Spouse name: Not on file   Number of children: 2   Years of education: Not on file   Highest education level: Not on file  Occupational History   Occupation: Programmer, applications    Employer: [employer]  Tobacco Use   Smoking status: Never   Smokeless tobacco: Never  Vaping Use   Vaping Use: Never used  Substance and Sexual Activity   Alcohol use: Never    Alcohol/week: 0.0 standard drinks   Drug use: Never    Sexual activity: Not on file  Other Topics Concern   Not on file  Social History Narrative   Not on file   Social Determinants of Health   Financial Resource Strain: Not on file  Food Insecurity: Not on file  Transportation Needs: Not on file  Physical Activity: Not on file  Stress: Not on file  Social Connections: Not on file   Family History  Problem Relation Age of Onset   Hyperlipidemia Mother    Hyperlipidemia Father    Hypertension Father     ROS: Currently denies lightheadedness, dizziness, Fever, chills, CP, SOB.    No personal history of DVT, PE, MI, or CVA. No loose teeth or dentures All other systems have been reviewed and were otherwise currently negative with the exception of those mentioned in the HPI and as above.  Objective: Vitals: Ht: 5\' 6"  Wt: 217.4 lbs Temp: 97.5 BP: 114/76 Pulse: 81 O2 99% on room air.   Physical Exam: General: Alert, NAD.  Antalgic Gait  HEENT: EOMI, Good Neck Extension  Pulm: No increased work of breathing.  Clear B/L A/P w/o crackle or wheeze.  CV: RRR, No m/g/r appreciated  GI: soft, NT, ND Neuro: Neuro without gross focal deficit.  Sensation intact distally Skin: No lesions in the area of chief complaint MSK/Surgical Site: left knee w/o redness or effusion.  medial JLT. ROM 0-110.  5/5 strength in extension and flexion.  +EHL/FHL.  NVI.  Stable varus and valgus stress.    Imaging Review Plain radiographs demonstrate severe degenerative joint disease of the left knee.   The overall alignment issignificant varus. The bone quality appears to be fair for age and reported activity level.  Preoperative templating of the joint replacement has been completed, documented, and submitted to the Operating Room personnel in order to optimize intra-operative equipment management.  Assessment: djd left knee Active Problems:   * No active hospital problems. *   Plan: Plan for Procedure(s): TOTAL KNEE ARTHROPLASTY  The patient  history, physical exam, clinical judgement of the provider and imaging are consistent with end stage degenerative joint disease and total joint arthroplasty is deemed medically necessary. The treatment options including medical management, injection therapy, and arthroplasty were discussed at length. The risks and benefits of Procedure(s): TOTAL KNEE ARTHROPLASTY were presented and reviewed.  The risks of nonoperative treatment, versus surgical intervention including but not limited to continued pain, aseptic loosening, stiffness, dislocation/subluxation, infection, bleeding, nerve injury, blood clots, cardiopulmonary complications, morbidity, mortality, among others were discussed. The patient verbalizes understanding and wishes to proceed with the plan.  Patient is being admitted for inpatient treatment for surgery, pain control, PT, prophylactic antibiotics, VTE prophylaxis, progressive ambulation, ADL's and discharge planning.   Dental prophylaxis discussed and recommended for 2 years postoperatively.  The patient does meet the criteria for TXA which will be used perioperatively.   ASA 325 mg will be used postoperatively for DVT prophylaxis in addition to SCDs,  and early ambulation. The patient is planning to be discharged home with OPPT   Armida Sans, PA-C 07/25/2020 4:40 PM

## 2020-07-25 NOTE — H&P (View-Only) (Signed)
KNEE ARTHROPLASTY ADMISSION H&P  Patient ID: HELEM REESOR MRN: [id number] DOB/AGE: [birth year] 50 y.o.  Chief Complaint: left knee pain.  Planned Procedure Date: 07/26/20 Medical Clearance by Dr. Duanne Guess     HPI: Laura Brown is a 50 y.o. female who presents for evaluation of djd left knee. The patient has a history of pain and functional disability in the left knee due to arthritis and has failed non-surgical conservative treatments for greater than 12 weeks to include NSAID's and/or analgesics, corticosteriod injections, weight reduction as appropriate, and activity modification.  Onset of symptoms was gradual, starting 2 years ago with rapidlly worsening course since that time. The patient noted no past surgery on the left knee.  Patient currently rates pain at 5 out of 10 with activity. Patient has worsening of pain with activity and weight bearing and pain that interferes with activities of daily living.  Patient has evidence of joint space narrowing by imaging studies.  There is no active infection.  Past Medical History:  Diagnosis Date   Anxiety    Depression    Osteoarthritis of both knees    Past Surgical History:  Procedure Laterality Date   CESAREAN SECTION     LAPAROSCOPIC GASTRIC SLEEVE RESECTION     TOTAL KNEE ARTHROPLASTY Right 01/19/2020   Procedure: TOTAL KNEE ARTHROPLASTY;  Surgeon: Teryl Lucy, MD;  Location: [facility];  Service: Orthopedics;  Laterality: Right;   No Known Allergies Prior to Admission medications   Medication Sig Start Date End Date Taking? Authorizing Provider  acetaminophen (TYLENOL) 500 MG tablet Take 1,000 mg by mouth every 6 (six) hours as needed for moderate pain.   Yes [provider]  Apremilast (OTEZLA) 30 MG TABS Take 1 tablet (30 mg total) by mouth 2 (two) times daily. 05/03/20  Yes Quentin Angst, MD  buPROPion (WELLBUTRIN SR) 200 MG 12 hr tablet Take 1 tablet (200 mg total) by mouth 2 (two) times daily. 06/22/20  Yes  Whitmire, Dawn W, FNP  cholecalciferol (VITAMIN D3) 25 MCG (1000 UNIT) tablet Take 2,000 Units by mouth daily.   Yes [provider]  DULoxetine (CYMBALTA) 60 MG capsule Take 120 mg by mouth daily.    Yes [provider]  fexofenadine (ALLEGRA) 180 MG tablet Take 1 tablet (180 mg total) by mouth daily. Patient taking differently: Take 180 mg by mouth at bedtime. 10/10/19  Yes Martin, Mary-Margaret, FNP  Liraglutide -Weight Management 18 MG/3ML SOPN INJECT 3 MG INTO THE SKIN DAILY. 06/22/20 06/22/21 Yes Whitmire, Thermon Leyland, FNP  verapamil (VERELAN) 100 MG 24 hr capsule Take 2 capsules by mouth daily. Patient taking differently: Take 200 mg by mouth at bedtime. 06/15/20  Yes   Vitamin D, Ergocalciferol, (DRISDOL) 1.25 MG (50000 UNIT) CAPS capsule TAKE 1 CAPSULE (50,000 UNITS TOTAL) BY MOUTH EVERY 7 (SEVEN) DAYS. Patient taking differently: Take 50,000 Units by mouth every Friday. 05/25/20 05/25/21 Yes Whitmire, Thermon Leyland, FNP  Cholecalciferol (VITAMIN D) 50 MCG (2000 UT) CAPS Take 1 capsule (2,000 Units total) by mouth daily. Patient not taking: Reported on 07/11/2020 09/16/19   Whitmire, Thermon Leyland, FNP  fluticasone (FLONASE) 50 MCG/ACT nasal spray Place 2 sprays into both nostrils daily. Patient not taking: No sig reported 10/10/19   Daphine Deutscher, Mary-Margaret, FNP  Insulin Pen Needle (BD PEN NEEDLE NANO 2ND GEN) 32G X 4 MM MISC 1 Device by Does not apply route daily. 06/03/19   Wilder Glade, MD   Social History   Socioeconomic History  Marital status: Divorced    Spouse name: Not on file   Number of children: 2   Years of education: Not on file   Highest education level: Not on file  Occupational History   Occupation: Programmer, applications    Employer: [employer]  Tobacco Use   Smoking status: Never   Smokeless tobacco: Never  Vaping Use   Vaping Use: Never used  Substance and Sexual Activity   Alcohol use: Never    Alcohol/week: 0.0 standard drinks   Drug use: Never    Sexual activity: Not on file  Other Topics Concern   Not on file  Social History Narrative   Not on file   Social Determinants of Health   Financial Resource Strain: Not on file  Food Insecurity: Not on file  Transportation Needs: Not on file  Physical Activity: Not on file  Stress: Not on file  Social Connections: Not on file   Family History  Problem Relation Age of Onset   Hyperlipidemia Mother    Hyperlipidemia Father    Hypertension Father     ROS: Currently denies lightheadedness, dizziness, Fever, chills, CP, SOB.    No personal history of DVT, PE, MI, or CVA. No loose teeth or dentures All other systems have been reviewed and were otherwise currently negative with the exception of those mentioned in the HPI and as above.  Objective: Vitals: Ht: 5\' 6"  Wt: 217.4 lbs Temp: 97.5 BP: 114/76 Pulse: 81 O2 99% on room air.   Physical Exam: General: Alert, NAD.  Antalgic Gait  HEENT: EOMI, Good Neck Extension  Pulm: No increased work of breathing.  Clear B/L A/P w/o crackle or wheeze.  CV: RRR, No m/g/r appreciated  GI: soft, NT, ND Neuro: Neuro without gross focal deficit.  Sensation intact distally Skin: No lesions in the area of chief complaint MSK/Surgical Site: left knee w/o redness or effusion.  medial JLT. ROM 0-110.  5/5 strength in extension and flexion.  +EHL/FHL.  NVI.  Stable varus and valgus stress.    Imaging Review Plain radiographs demonstrate severe degenerative joint disease of the left knee.   The overall alignment issignificant varus. The bone quality appears to be fair for age and reported activity level.  Preoperative templating of the joint replacement has been completed, documented, and submitted to the Operating Room personnel in order to optimize intra-operative equipment management.  Assessment: djd left knee Active Problems:   * No active hospital problems. *   Plan: Plan for Procedure(s): TOTAL KNEE ARTHROPLASTY  The patient  history, physical exam, clinical judgement of the provider and imaging are consistent with end stage degenerative joint disease and total joint arthroplasty is deemed medically necessary. The treatment options including medical management, injection therapy, and arthroplasty were discussed at length. The risks and benefits of Procedure(s): TOTAL KNEE ARTHROPLASTY were presented and reviewed.  The risks of nonoperative treatment, versus surgical intervention including but not limited to continued pain, aseptic loosening, stiffness, dislocation/subluxation, infection, bleeding, nerve injury, blood clots, cardiopulmonary complications, morbidity, mortality, among others were discussed. The patient verbalizes understanding and wishes to proceed with the plan.  Patient is being admitted for inpatient treatment for surgery, pain control, PT, prophylactic antibiotics, VTE prophylaxis, progressive ambulation, ADL's and discharge planning.   Dental prophylaxis discussed and recommended for 2 years postoperatively.  The patient does meet the criteria for TXA which will be used perioperatively.   ASA 325 mg will be used postoperatively for DVT prophylaxis in addition to SCDs,  and early ambulation. The patient is planning to be discharged home with OPPT   Armida Sans, PA-C 07/25/2020 4:40 PM

## 2020-07-26 ENCOUNTER — Encounter (HOSPITAL_COMMUNITY)
Admission: RE | Disposition: A | Discharge status: Home / Self Care | Payer: Self-pay | Source: Home / Self Care | Attending: Orthopedic Surgery

## 2020-07-26 ENCOUNTER — Ambulatory Visit (HOSPITAL_COMMUNITY): Payer: No Typology Code available for payment source | Admitting: Certified Registered"

## 2020-07-26 ENCOUNTER — Encounter (HOSPITAL_COMMUNITY): Payer: Self-pay | Admitting: Orthopedic Surgery

## 2020-07-26 ENCOUNTER — Other Ambulatory Visit: Payer: Self-pay

## 2020-07-26 ENCOUNTER — Observation Stay (HOSPITAL_COMMUNITY)
Admission: RE | Admit: 2020-07-26 | Discharge: 2020-07-27 | Disposition: A | Discharge status: Home / Self Care | Payer: No Typology Code available for payment source | Attending: Orthopedic Surgery | Admitting: Orthopedic Surgery

## 2020-07-26 ENCOUNTER — Ambulatory Visit (HOSPITAL_COMMUNITY): Payer: No Typology Code available for payment source

## 2020-07-26 ENCOUNTER — Other Ambulatory Visit (HOSPITAL_COMMUNITY): Payer: Self-pay

## 2020-07-26 DIAGNOSIS — M21069 Valgus deformity, not elsewhere classified, unspecified knee: Secondary | ICD-10-CM | POA: Diagnosis not present

## 2020-07-26 DIAGNOSIS — M1712 Unilateral primary osteoarthritis, left knee: Secondary | ICD-10-CM | POA: Diagnosis not present

## 2020-07-26 DIAGNOSIS — Z96652 Presence of left artificial knee joint: Secondary | ICD-10-CM

## 2020-07-26 DIAGNOSIS — Z96651 Presence of right artificial knee joint: Secondary | ICD-10-CM | POA: Insufficient documentation

## 2020-07-26 HISTORY — PX: TOTAL KNEE ARTHROPLASTY: SHX125

## 2020-07-26 SURGERY — ARTHROPLASTY, KNEE, TOTAL
Anesthesia: Monitor Anesthesia Care | Site: Knee | Laterality: Left

## 2020-07-26 MED ORDER — FENTANYL CITRATE (PF) 100 MCG/2ML IJ SOLN: 25.0000 ug | INTRAMUSCULAR | Status: DC | PRN

## 2020-07-26 MED ORDER — LACTATED RINGERS IV SOLN: INTRAVENOUS | Status: DC

## 2020-07-26 MED ORDER — PHENOL 1.4 % MT LIQD: 1.0000 | OROMUCOSAL | Status: DC | PRN

## 2020-07-26 MED ORDER — TRANEXAMIC ACID-NACL 1000-0.7 MG/100ML-% IV SOLN
1000.0000 mg | INTRAVENOUS | Status: AC
  Administered 2020-07-26: 1000 mg via INTRAVENOUS
  Filled 2020-07-26: qty 100

## 2020-07-26 MED ORDER — ACETAMINOPHEN 500 MG PO TABS: 1000.0000 mg | ORAL_TABLET | Freq: Once | ORAL | Status: DC | PRN

## 2020-07-26 MED ORDER — TRANEXAMIC ACID-NACL 1000-0.7 MG/100ML-% IV SOLN
1000.0000 mg | Freq: Once | INTRAVENOUS | Status: AC
  Administered 2020-07-26: 1000 mg via INTRAVENOUS
  Filled 2020-07-26: qty 100

## 2020-07-26 MED ORDER — DIPHENHYDRAMINE HCL 12.5 MG/5ML PO ELIX: 12.5000 mg | ORAL_SOLUTION | ORAL | Status: DC | PRN

## 2020-07-26 MED ORDER — CEFAZOLIN SODIUM-DEXTROSE 2-4 GM/100ML-% IV SOLN
2.0000 g | Freq: Four times a day (QID) | INTRAVENOUS | Status: AC
  Administered 2020-07-26 (×2): 2 g via INTRAVENOUS
  Filled 2020-07-26 (×2): qty 100

## 2020-07-26 MED ORDER — ONDANSETRON HCL 4 MG/2ML IJ SOLN: 4.0000 mg | Freq: Four times a day (QID) | INTRAMUSCULAR | Status: DC | PRN

## 2020-07-26 MED ORDER — SENNA-DOCUSATE SODIUM 8.6-50 MG PO TABS
2.0000 | ORAL_TABLET | Freq: Every day | ORAL | 1 refills | Status: DC
  Filled 2020-07-26: qty 30, 15d supply, fill #0

## 2020-07-26 MED ORDER — CHLORHEXIDINE GLUCONATE 0.12 % MT SOLN
15.0000 mL | Freq: Once | OROMUCOSAL | Status: AC
  Administered 2020-07-26: 15 mL via OROMUCOSAL

## 2020-07-26 MED ORDER — CEFAZOLIN SODIUM-DEXTROSE 2-4 GM/100ML-% IV SOLN
2.0000 g | INTRAVENOUS | Status: AC
Start: 2020-07-26 — End: 2020-07-26
  Administered 2020-07-26: 2 g via INTRAVENOUS
  Filled 2020-07-26: qty 100

## 2020-07-26 MED ORDER — METHOCARBAMOL 1000 MG/10ML IJ SOLN
500.0000 mg | Freq: Four times a day (QID) | INTRAVENOUS | Status: DC | PRN
  Filled 2020-07-26: qty 5

## 2020-07-26 MED ORDER — ONDANSETRON HCL 4 MG PO TABS
4.0000 mg | ORAL_TABLET | Freq: Four times a day (QID) | ORAL | Status: DC | PRN
Start: 2020-07-26 — End: 2020-07-27

## 2020-07-26 MED ORDER — LORATADINE 10 MG PO TABS
10.0000 mg | ORAL_TABLET | Freq: Every day | ORAL | Status: DC
  Filled 2020-07-26: qty 1

## 2020-07-26 MED ORDER — METOCLOPRAMIDE HCL 5 MG PO TABS: 5.0000 mg | ORAL_TABLET | Freq: Three times a day (TID) | ORAL | Status: DC | PRN

## 2020-07-26 MED ORDER — MAGNESIUM CITRATE PO SOLN: 1.0000 | Freq: Once | ORAL | Status: DC | PRN

## 2020-07-26 MED ORDER — DOCUSATE SODIUM 100 MG PO CAPS
100.0000 mg | ORAL_CAPSULE | Freq: Two times a day (BID) | ORAL | Status: DC
  Administered 2020-07-26 – 2020-07-27 (×2): 100 mg via ORAL
  Filled 2020-07-26 (×2): qty 1

## 2020-07-26 MED ORDER — PROPOFOL 500 MG/50ML IV EMUL
INTRAVENOUS | Status: DC | PRN
  Administered 2020-07-26: 75 ug/kg/min via INTRAVENOUS

## 2020-07-26 MED ORDER — HYDROMORPHONE HCL 1 MG/ML IJ SOLN: 0.5000 mg | INTRAMUSCULAR | Status: DC | PRN

## 2020-07-26 MED ORDER — BUPROPION HCL ER (SR) 100 MG PO TB12
200.0000 mg | ORAL_TABLET | Freq: Two times a day (BID) | ORAL | Status: DC
  Administered 2020-07-26 – 2020-07-27 (×2): 200 mg via ORAL
  Filled 2020-07-26 (×2): qty 2

## 2020-07-26 MED ORDER — OXYCODONE HCL 5 MG PO TABS
5.0000 mg | ORAL_TABLET | ORAL | Status: DC | PRN
  Administered 2020-07-26: 10 mg via ORAL
  Filled 2020-07-26 (×6): qty 2

## 2020-07-26 MED ORDER — DEXAMETHASONE SODIUM PHOSPHATE 10 MG/ML IJ SOLN
INTRAMUSCULAR | Status: DC | PRN
  Administered 2020-07-26: 10 mg via INTRAVENOUS

## 2020-07-26 MED ORDER — BUPIVACAINE HCL 0.25 % IJ SOLN
INTRAMUSCULAR | Status: AC
  Filled 2020-07-26: qty 1

## 2020-07-26 MED ORDER — MENTHOL 3 MG MT LOZG: 1.0000 | LOZENGE | OROMUCOSAL | Status: DC | PRN

## 2020-07-26 MED ORDER — POTASSIUM CHLORIDE IN NACL 20-0.45 MEQ/L-% IV SOLN
INTRAVENOUS | Status: DC
  Filled 2020-07-26 (×2): qty 1000

## 2020-07-26 MED ORDER — OXYCODONE HCL 5 MG PO TABS
10.0000 mg | ORAL_TABLET | ORAL | Status: DC | PRN
  Administered 2020-07-26 – 2020-07-27 (×5): 10 mg via ORAL

## 2020-07-26 MED ORDER — ACETAMINOPHEN 500 MG PO TABS
1000.0000 mg | ORAL_TABLET | Freq: Once | ORAL | Status: AC
  Administered 2020-07-26: 1000 mg via ORAL
  Filled 2020-07-26: qty 2

## 2020-07-26 MED ORDER — KETOROLAC TROMETHAMINE 30 MG/ML IJ SOLN
INTRAMUSCULAR | Status: DC | PRN
  Administered 2020-07-26: 30 mg

## 2020-07-26 MED ORDER — METHOCARBAMOL 500 MG PO TABS
500.0000 mg | ORAL_TABLET | Freq: Four times a day (QID) | ORAL | Status: DC | PRN
  Administered 2020-07-26 – 2020-07-27 (×4): 500 mg via ORAL
  Filled 2020-07-26 (×4): qty 1

## 2020-07-26 MED ORDER — OXYCODONE HCL 5 MG PO TABS
5.0000 mg | ORAL_TABLET | ORAL | 0 refills | Status: DC | PRN
  Filled 2020-07-26: qty 40, 7d supply, fill #0

## 2020-07-26 MED ORDER — WATER FOR IRRIGATION, STERILE IR SOLN
Status: DC | PRN
  Administered 2020-07-26: 2000 mL

## 2020-07-26 MED ORDER — POVIDONE-IODINE 10 % EX SWAB
2.0000 "application " | Freq: Once | CUTANEOUS | Status: AC
  Administered 2020-07-26: 2 via TOPICAL

## 2020-07-26 MED ORDER — ACETAMINOPHEN 325 MG PO TABS
325.0000 mg | ORAL_TABLET | Freq: Four times a day (QID) | ORAL | Status: DC | PRN
Start: 2020-07-27 — End: 2020-07-27

## 2020-07-26 MED ORDER — SODIUM CHLORIDE 0.9 % IR SOLN
Status: DC | PRN
  Administered 2020-07-26: 300 mL

## 2020-07-26 MED ORDER — VERAPAMIL HCL ER 180 MG PO TBCR
180.0000 mg | EXTENDED_RELEASE_TABLET | Freq: Every day | ORAL | Status: DC
  Administered 2020-07-26: 180 mg via ORAL
  Filled 2020-07-26: qty 1

## 2020-07-26 MED ORDER — ACETAMINOPHEN 500 MG PO TABS
1000.0000 mg | ORAL_TABLET | Freq: Four times a day (QID) | ORAL | Status: AC
  Administered 2020-07-26 – 2020-07-27 (×4): 1000 mg via ORAL
  Filled 2020-07-26 (×4): qty 2

## 2020-07-26 MED ORDER — FENTANYL CITRATE (PF) 100 MCG/2ML IJ SOLN
INTRAMUSCULAR | Status: AC
  Filled 2020-07-26: qty 2

## 2020-07-26 MED ORDER — ONDANSETRON HCL 4 MG/2ML IJ SOLN
INTRAMUSCULAR | Status: DC | PRN
  Administered 2020-07-26: 4 mg via INTRAVENOUS

## 2020-07-26 MED ORDER — MIDAZOLAM HCL 5 MG/5ML IJ SOLN
INTRAMUSCULAR | Status: DC | PRN
  Administered 2020-07-26 (×2): 1 mg via INTRAVENOUS

## 2020-07-26 MED ORDER — KETOROLAC TROMETHAMINE 30 MG/ML IJ SOLN
INTRAMUSCULAR | Status: AC
  Filled 2020-07-26: qty 1

## 2020-07-26 MED ORDER — ORAL CARE MOUTH RINSE: 15.0000 mL | Freq: Once | OROMUCOSAL | Status: AC

## 2020-07-26 MED ORDER — ROPIVACAINE HCL 7.5 MG/ML IJ SOLN
INTRAMUSCULAR | Status: DC | PRN
  Administered 2020-07-26: 20 mL via PERINEURAL

## 2020-07-26 MED ORDER — ACETAMINOPHEN 160 MG/5ML PO SOLN: 1000.0000 mg | Freq: Once | ORAL | Status: DC | PRN

## 2020-07-26 MED ORDER — OXYCODONE HCL 5 MG/5ML PO SOLN: 5.0000 mg | Freq: Once | ORAL | Status: DC | PRN

## 2020-07-26 MED ORDER — BACLOFEN 10 MG PO TABS
10.0000 mg | ORAL_TABLET | Freq: Three times a day (TID) | ORAL | 0 refills | Status: DC
  Filled 2020-07-26: qty 30, 10d supply, fill #0

## 2020-07-26 MED ORDER — DULOXETINE HCL 60 MG PO CPEP
120.0000 mg | ORAL_CAPSULE | Freq: Every day | ORAL | Status: DC
  Administered 2020-07-27: 120 mg via ORAL
  Filled 2020-07-26 (×2): qty 2

## 2020-07-26 MED ORDER — CLONIDINE HCL (ANALGESIA) 100 MCG/ML EP SOLN
EPIDURAL | Status: DC | PRN
  Administered 2020-07-26: 100 ug

## 2020-07-26 MED ORDER — ACETAMINOPHEN 10 MG/ML IV SOLN: 1000.0000 mg | Freq: Once | INTRAVENOUS | Status: DC | PRN

## 2020-07-26 MED ORDER — OXYCODONE HCL 5 MG PO TABS: 5.0000 mg | ORAL_TABLET | Freq: Once | ORAL | Status: DC | PRN

## 2020-07-26 MED ORDER — ASPIRIN EC 325 MG PO TBEC
325.0000 mg | DELAYED_RELEASE_TABLET | Freq: Two times a day (BID) | ORAL | 0 refills | Status: DC
  Filled 2020-07-26: qty 60, 30d supply, fill #0

## 2020-07-26 MED ORDER — METOCLOPRAMIDE HCL 5 MG/ML IJ SOLN: 5.0000 mg | Freq: Three times a day (TID) | INTRAMUSCULAR | Status: DC | PRN

## 2020-07-26 MED ORDER — KETOROLAC TROMETHAMINE 15 MG/ML IJ SOLN
15.0000 mg | Freq: Four times a day (QID) | INTRAMUSCULAR | Status: AC
  Administered 2020-07-26 – 2020-07-27 (×4): 15 mg via INTRAVENOUS
  Filled 2020-07-26 (×4): qty 1

## 2020-07-26 MED ORDER — MIDAZOLAM HCL 2 MG/2ML IJ SOLN
INTRAMUSCULAR | Status: AC
  Filled 2020-07-26: qty 2

## 2020-07-26 MED ORDER — LIRAGLUTIDE -WEIGHT MANAGEMENT 18 MG/3ML ~~LOC~~ SOPN: 3.0000 mg | PEN_INJECTOR | Freq: Every day | SUBCUTANEOUS | Status: DC

## 2020-07-26 MED ORDER — DEXAMETHASONE SODIUM PHOSPHATE 10 MG/ML IJ SOLN
10.0000 mg | Freq: Once | INTRAMUSCULAR | Status: AC
Start: 2020-07-27 — End: 2020-07-27
  Administered 2020-07-27: 10 mg via INTRAVENOUS
  Filled 2020-07-26: qty 1

## 2020-07-26 MED ORDER — ONDANSETRON HCL 4 MG PO TABS
4.0000 mg | ORAL_TABLET | Freq: Three times a day (TID) | ORAL | 0 refills | Status: DC | PRN
  Filled 2020-07-26: qty 10, 4d supply, fill #0

## 2020-07-26 MED ORDER — PROPOFOL 1000 MG/100ML IV EMUL
INTRAVENOUS | Status: AC
  Filled 2020-07-26: qty 100

## 2020-07-26 MED ORDER — PROPOFOL 10 MG/ML IV BOLUS
INTRAVENOUS | Status: DC | PRN
  Administered 2020-07-26: 20 mg via INTRAVENOUS

## 2020-07-26 MED ORDER — BISACODYL 10 MG RE SUPP: 10.0000 mg | Freq: Every day | RECTAL | Status: DC | PRN

## 2020-07-26 MED ORDER — BUPIVACAINE IN DEXTROSE 0.75-8.25 % IT SOLN
INTRATHECAL | Status: DC | PRN
  Administered 2020-07-26: 1.8 mL via INTRATHECAL

## 2020-07-26 MED ORDER — FENTANYL CITRATE (PF) 100 MCG/2ML IJ SOLN
INTRAMUSCULAR | Status: DC | PRN
  Administered 2020-07-26 (×2): 50 ug via INTRAVENOUS

## 2020-07-26 MED ORDER — POLYETHYLENE GLYCOL 3350 17 G PO PACK: 17.0000 g | PACK | Freq: Every day | ORAL | Status: DC | PRN

## 2020-07-26 MED ORDER — ALUM & MAG HYDROXIDE-SIMETH 200-200-20 MG/5ML PO SUSP: 30.0000 mL | ORAL | Status: DC | PRN

## 2020-07-26 MED ORDER — BUPIVACAINE HCL 0.25 % IJ SOLN
INTRAMUSCULAR | Status: DC | PRN
  Administered 2020-07-26: 30 mL

## 2020-07-26 MED ORDER — ASPIRIN EC 325 MG PO TBEC
325.0000 mg | DELAYED_RELEASE_TABLET | Freq: Every day | ORAL | Status: DC
  Administered 2020-07-27: 325 mg via ORAL
  Filled 2020-07-26: qty 1

## 2020-07-26 SURGICAL SUPPLY — 61 items
ATTUNE MED DOME PAT 38 KNEE (Knees) ×2 IMPLANT
ATTUNE PS FEM LT SZ 5 CEM KNEE (Femur) ×2 IMPLANT
BAG COUNTER SPONGE SURGICOUNT (BAG) ×2 IMPLANT
BAG SPEC THK2 15X12 ZIP CLS (MISCELLANEOUS) ×1
BAG SPNG CNTER NS LX DISP (BAG) ×1
BAG ZIPLOCK 12X15 (MISCELLANEOUS) ×2 IMPLANT
BASEPLATE TIB CMT FB PCKT SZ5 (Knees) ×2 IMPLANT
BLADE SAG 18X100X1.27 (BLADE) ×2 IMPLANT
BLADE SAW SGTL 11.0X1.19X90.0M (BLADE) ×2 IMPLANT
BLADE SAW SGTL 13X75X1.27 (BLADE) ×2 IMPLANT
BLADE SURG 15 STRL LF DISP TIS (BLADE) ×1 IMPLANT
BLADE SURG 15 STRL SS (BLADE) ×2
BNDG CMPR MED 10X6 ELC LF (GAUZE/BANDAGES/DRESSINGS) ×1
BNDG ELASTIC 6X10 VLCR STRL LF (GAUZE/BANDAGES/DRESSINGS) ×2 IMPLANT
BOWL SMART MIX CTS (DISPOSABLE) ×2 IMPLANT
BSPLAT TIB 5 CMNT FXBRNG STRL (Knees) ×1 IMPLANT
CEMENT HV SMART SET (Cement) ×4 IMPLANT
CLSR STERI-STRIP ANTIMIC 1/2X4 (GAUZE/BANDAGES/DRESSINGS) ×2 IMPLANT
COVER SURGICAL LIGHT HANDLE (MISCELLANEOUS) ×2 IMPLANT
CUFF TOURN SGL QUICK 34 (TOURNIQUET CUFF) ×2
CUFF TRNQT CYL 34X4.125X (TOURNIQUET CUFF) ×1 IMPLANT
DECANTER SPIKE VIAL GLASS SM (MISCELLANEOUS) ×2 IMPLANT
DRAPE SHEET LG 3/4 BI-LAMINATE (DRAPES) ×2 IMPLANT
DRAPE U-SHAPE 47X51 STRL (DRAPES) ×2 IMPLANT
DRSG MEPILEX BORDER 4X12 (GAUZE/BANDAGES/DRESSINGS) ×2 IMPLANT
DRSG PAD ABDOMINAL 8X10 ST (GAUZE/BANDAGES/DRESSINGS) ×2 IMPLANT
DURAPREP 26ML APPLICATOR (WOUND CARE) ×4 IMPLANT
ELECT REM PT RETURN 15FT ADLT (MISCELLANEOUS) ×2 IMPLANT
GLOVE SRG 8 PF TXTR STRL LF DI (GLOVE) ×1 IMPLANT
GLOVE SURG ENC MOIS LTX SZ6.5 (GLOVE) ×2 IMPLANT
GLOVE SURG ENC MOIS LTX SZ7.5 (GLOVE) ×2 IMPLANT
GLOVE SURG UNDER POLY LF SZ7 (GLOVE) ×2 IMPLANT
GLOVE SURG UNDER POLY LF SZ8 (GLOVE) ×2
GOWN STRL REUS W/ TWL LRG LVL3 (GOWN DISPOSABLE) ×2 IMPLANT
GOWN STRL REUS W/TWL LRG LVL3 (GOWN DISPOSABLE) ×6
HANDPIECE INTERPULSE COAX TIP (DISPOSABLE) ×2
HOLDER FOLEY CATH W/STRAP (MISCELLANEOUS) ×2 IMPLANT
HOOD PEEL AWAY FLYTE STAYCOOL (MISCELLANEOUS) ×6 IMPLANT
IMMOBILIZER KNEE 20 (SOFTGOODS) ×2
IMMOBILIZER KNEE 20 THIGH 36 (SOFTGOODS) ×1 IMPLANT
INSERT TIB ATTUNE FB SZ5X8 (Insert) ×2 IMPLANT
KIT TURNOVER KIT A (KITS) ×2 IMPLANT
MANIFOLD NEPTUNE II (INSTRUMENTS) ×2 IMPLANT
NS IRRIG 1000ML POUR BTL (IV SOLUTION) ×2 IMPLANT
PACK ICE MAXI GEL EZY WRAP (MISCELLANEOUS) ×2 IMPLANT
PACK TOTAL KNEE CUSTOM (KITS) ×2 IMPLANT
PENCIL SMOKE EVACUATOR (MISCELLANEOUS) ×2 IMPLANT
PIN DRILL FIX HALF THREAD (BIT) ×2 IMPLANT
PIN STEINMAN FIXATION KNEE (PIN) ×1 IMPLANT
PROTECTOR NERVE ULNAR (MISCELLANEOUS) ×2 IMPLANT
SET HNDPC FAN SPRY TIP SCT (DISPOSABLE) ×1 IMPLANT
SET PAD KNEE POSITIONER (MISCELLANEOUS) ×2 IMPLANT
SUT MNCRL AB 4-0 PS2 18 (SUTURE) ×2 IMPLANT
SUT VIC AB 1 CT1 36 (SUTURE) ×4 IMPLANT
SUT VIC AB 2-0 CT1 27 (SUTURE) ×4
SUT VIC AB 2-0 CT1 TAPERPNT 27 (SUTURE) ×2 IMPLANT
SUT VIC AB 3-0 SH 8-18 (SUTURE) ×2 IMPLANT
TRAY FOLEY MTR SLVR 14FR STAT (SET/KITS/TRAYS/PACK) ×2 IMPLANT
TRAY FOLEY MTR SLVR 16FR STAT (SET/KITS/TRAYS/PACK) IMPLANT
TUBE SUCTION HIGH CAP CLEAR NV (SUCTIONS) ×2 IMPLANT
WATER STERILE IRR 1000ML POUR (IV SOLUTION) ×4 IMPLANT

## 2020-07-26 NOTE — Interval H&P Note (Signed)
History and Physical Interval Note:  07/26/2020 7:19 AM  Laura Brown  has presented today for surgery, with the diagnosis of djd left knee.  The various methods of treatment have been discussed with the patient and family. After consideration of risks, benefits and other options for treatment, the patient has consented to  Procedure(s): TOTAL KNEE ARTHROPLASTY (Left) as a surgical intervention.  The patient's history has been reviewed, patient examined, no change in status, stable for surgery.  I have reviewed the patient's chart and labs.  Questions were answered to the patient's satisfaction.     Eulas Post

## 2020-07-26 NOTE — Evaluation (Signed)
Physical Therapy Evaluation Patient Details Name: Laura Brown MRN: [id number] DOB: 1970/08/10 Today's Date: 07/26/2020   History of Present Illness  50 y.o. female admitted 07/26/20 for L TKA. PMH includes R TKA 01/19/20, OA, depression, anxiety.  Clinical Impression  Pt is s/p TKA resulting in the deficits listed below (see PT Problem List). Pt ambulated 77' with L knee immobilizer and RW, distance limited by buckling of LLE (I suspect adductor block was not fully worn off, SLR strength 2/5). Initiated TKA HEP. Good progress expected.  Pt will benefit from skilled PT to increase their independence and safety with mobility to allow discharge to the venue listed below.      Follow Up Recommendations Follow surgeon's recommendation for DC plan and follow-up therapies    Equipment Recommendations  None recommended by PT    Recommendations for Other Services       Precautions / Restrictions Precautions Precautions: Knee Precaution Comments: reviewed no pillow under knee Required Braces or Orthoses: Knee Immobilizer - Left Knee Immobilizer - Left: Discontinue once straight leg raise with < 10 degree lag Restrictions Weight Bearing Restrictions: No      Mobility  Bed Mobility Overal bed mobility: Modified Independent             General bed mobility comments: used rail, HOB up    Transfers Overall transfer level: Needs assistance Equipment used: Rolling walker (2 wheeled) Transfers: Sit to/from Stand Sit to Stand: Min assist         General transfer comment: VCs hand placement, min A to power up  Ambulation/Gait Ambulation/Gait assistance: Min guard Gait Distance (Feet): 60 Feet Assistive device: Rolling walker (2 wheeled) Gait Pattern/deviations: Step-to pattern Gait velocity: decr   General Gait Details: KI on LLE, mild buckling x 1, VCs sequencing, no loss of balance  Stairs            Wheelchair Mobility    Modified Rankin (Stroke Patients  Only)       Balance Overall balance assessment: Modified Independent                                           Pertinent Vitals/Pain Pain Assessment: 0-10 Pain Score: 6  Pain Location: L knee Pain Descriptors / Indicators: Sore Pain Intervention(s): Limited activity within patient's tolerance;Monitored during session;Premedicated before session;Ice applied    Home Living Family/patient expects to be discharged to:: Private residence Living Arrangements: Children Available Help at Discharge: Family Type of Home: House Home Access: Stairs to enter Entrance Stairs-Rails: Left Entrance Stairs-Number of Steps: 4 Home Layout: One level Home Equipment: Environmental consultant - 2 wheels;Cane - single point;Shower seat;Bedside commode Additional Comments: pt's mom lives next door and can assist as needed. Daughter will assist also.    Prior Function Level of Independence: Independent         Comments: walked without AD, no falls in 6 months     Hand Dominance        Extremity/Trunk Assessment   Upper Extremity Assessment Upper Extremity Assessment: Overall WFL for tasks assessed    Lower Extremity Assessment Lower Extremity Assessment: LLE deficits/detail RLE Sensation: WNL LLE Deficits / Details: SLR 2/5 LLE Sensation: WNL    Cervical / Trunk Assessment Cervical / Trunk Assessment: Normal  Communication   Communication: No difficulties  Cognition Arousal/Alertness: Awake/alert Behavior During Therapy: WFL for tasks assessed/performed Overall Cognitive Status: Within  Functional Limits for tasks assessed                                        General Comments      Exercises Total Joint Exercises Ankle Circles/Pumps: AROM;Both;10 reps;Supine Quad Sets: AROM;Left;5 reps;Supine Long Arc Quad: AROM;Left;5 reps;Seated Goniometric ROM: 0-60* aAROM L knee   Assessment/Plan    PT Assessment Patient needs continued PT services  PT Problem  List Decreased range of motion;Decreased activity tolerance;Decreased strength;Decreased mobility;Decreased balance;Pain       PT Treatment Interventions DME instruction;Gait training;Stair training;Balance training;Patient/family education;Therapeutic exercise;Therapeutic activities    PT Goals (Current goals can be found in the Care Plan section)  Acute Rehab PT Goals Patient Stated Goal: hike, go up stairs PT Goal Formulation: With patient Time For Goal Achievement: 08/02/20 Potential to Achieve Goals: Good    Frequency 7X/week   Barriers to discharge        Co-evaluation               AM-PAC PT "6 Clicks" Mobility  Outcome Measure Help needed turning from your back to your side while in a flat bed without using bedrails?: A Little Help needed moving from lying on your back to sitting on the side of a flat bed without using bedrails?: A Little Help needed moving to and from a bed to a chair (including a wheelchair)?: A Little Help needed standing up from a chair using your arms (e.g., wheelchair or bedside chair)?: A Little Help needed to walk in hospital room?: A Little Help needed climbing 3-5 steps with a railing? : A Little 6 Click Score: 18    End of Session Equipment Utilized During Treatment: Gait belt Activity Tolerance: Patient tolerated treatment well Patient left: in chair;with call bell/phone within reach;with chair alarm set Nurse Communication: Mobility status PT Visit Diagnosis: Difficulty in walking, not elsewhere classified (R26.2);Pain Pain - Right/Left: Left Pain - part of body: Knee    Time: 8563-1497 PT Time Calculation (min) (ACUTE ONLY): 23 min   Charges:   PT Evaluation $PT Eval Moderate Complexity: 1 Mod PT Treatments $Gait Training: 8-22 mins       Ralene Bathe Kistler PT 07/26/2020  Acute Rehabilitation Services Pager [phone] Office [phone]

## 2020-07-26 NOTE — Anesthesia Procedure Notes (Signed)
Anesthesia Regional Block: Adductor canal block   Pre-Anesthetic Checklist: , timeout performed,  Correct Patient, Correct Site, Correct Laterality,  Correct Procedure, Correct Position, site marked,  Risks and benefits discussed,  Surgical consent,  Pre-op evaluation,  At surgeon's request and post-op pain management  Laterality: Lower and Left  Prep: chloraprep       Needles:  Injection technique: Single-shot      Needle Length: 9cm  Needle Gauge: 22     Additional Needles: Arrow StimuQuik ECHO Echogenic Stimulating PNB Needle  Procedures:,,,, ultrasound used (permanent image in chart),,    Narrative:  Start time: 07/26/2020 7:18 AM End time: 07/26/2020 7:23 AM Injection made incrementally with aspirations every 5 mL.  Performed by: Personally  Anesthesiologist: Val Eagle, MD

## 2020-07-26 NOTE — Transfer of Care (Signed)
Immediate Anesthesia Transfer of Care Note  Patient: Laura Brown  Procedure(s) Performed: TOTAL KNEE ARTHROPLASTY (Left: Knee)  Patient Location: PACU  Anesthesia Type:Regional and Spinal  Level of Consciousness: awake, alert  and oriented  Airway & Oxygen Therapy: Patient Spontanous Breathing and Patient connected to face mask oxygen  Post-op Assessment: Report given to RN and Post -op Vital signs reviewed and stable  Post vital signs: Reviewed and stable  Last Vitals:  Vitals Value Taken Time  BP 100/57 07/26/20 0956  Temp    Pulse 62 07/26/20 0957  Resp 18 07/26/20 0957  SpO2 98 % 07/26/20 0957  Vitals shown include unvalidated device data.  Last Pain:  Vitals:   07/26/20 0600  TempSrc: Oral  PainSc:          Complications: No notable events documented.

## 2020-07-26 NOTE — Plan of Care (Signed)
°  Problem: Education: °Goal: Knowledge of the prescribed therapeutic regimen will improve °Outcome: Progressing °  °Problem: Clinical Measurements: °Goal: Postoperative complications will be avoided or minimized °Outcome: Progressing °  °Problem: Pain Management: °Goal: Pain level will decrease with appropriate interventions °Outcome: Progressing °  °

## 2020-07-26 NOTE — Anesthesia Procedure Notes (Signed)
Spinal  Patient location during procedure: OR End time: 07/26/2020 7:44 AM Reason for block: surgical anesthesia Staffing Performed: anesthesiologist  Preanesthetic Checklist Completed: patient identified, IV checked, site marked, risks and benefits discussed, surgical consent, monitors and equipment checked, pre-op evaluation and timeout performed Spinal Block Patient position: sitting Prep: DuraPrep Patient monitoring: heart rate, continuous pulse ox and blood pressure Approach: left paramedian Location: L3-4 Injection technique: single-shot Needle Needle type: Pencan  Needle gauge: 24 G Needle length: 9 cm Assessment Sensory level: T6 Events: CSF return Additional Notes Expiration date of kit checked and confirmed. Patient tolerated procedure well, without complications.

## 2020-07-26 NOTE — Op Note (Signed)
DATE OF SURGERY:  07/26/2020 TIME: 9:34 AM  PATIENT NAME:  Laura Brown   AGE: 50 y.o.    PRE-OPERATIVE DIAGNOSIS: Left knee primary localized valgus osteoarthritis  POST-OPERATIVE DIAGNOSIS:  Same  PROCEDURE: Left total Knee Arthroplasty  SURGEON:  Eulas Post, MD   ASSISTANT:  Janine Ores, PA-C, present and scrubbed throughout the case, critical for assistance with exposure, retraction, instrumentation, and closure.   OPERATIVE IMPLANTS: Depuy Attune size 5 posterior Stabilized Femur, with a size 5 fixed Bearing Tibia, 8 polyethylene insert with a 38 medialized oval dome polyethylene patella.  PREOPERATIVE INDICATIONS:  Laura Brown is a 50 y.o. year old female with end stage bone on bone degenerative arthritis of the knee who failed conservative treatment, including injections, antiinflammatories, activity modification, and assistive devices, and had significant impairment of their activities of daily living, and elected for Total Knee Arthroplasty.   The risks, benefits, and alternatives were discussed at length including but not limited to the risks of infection, bleeding, nerve injury, stiffness, blood clots, the need for revision surgery, cardiopulmonary complications, among others, and they were willing to proceed.  OPERATIVE FINDINGS AND UNIQUE ASPECTS OF THE CASE: She had severe degenerative changes both medially and laterally with tibiofemoral subluxation.  She was not as hypoplastic on the femur but I did cut her distal femur at 5 degrees of external rotation.  I did not really have any significant notching.  I match the femur on the lateral side, which left a little bit of the medial femur uncovered, but optimize patellar tracking.  Initially, my first resection of the tibia was not quite deep enough, and I took an additional 4 off of the tibia.  This left me with an appropriate 8.  ESTIMATED BLOOD LOSS: 100 mL  OPERATIVE DESCRIPTION:  The patient was brought to  the operative room and placed in a supine position.  Anesthesia was administered.  IV antibiotics were given.  The lower extremity was prepped and draped in the usual sterile fashion.  Time out was performed.  The leg was elevated and exsanguinated and the tourniquet was inflated.  Anterior quadriceps tendon splitting approach was performed.  The patella was everted and osteophytes were removed.  The anterior horn of the medial and lateral meniscus was removed.   The patella was then measured, and cut with the saw.  The thickness before the cut was 19 and after the cut was 14.  A metal shield was used to protect the patella throughout the case.    The distal femur was opened with the drill and the intramedullary distal femoral cutting jig was utilized, set at 5 degrees resecting 9 mm off the distal femur.  Care was taken to protect the collateral ligaments.  Then the extramedullary tibial cutting jig was utilized making the appropriate cut using the anterior tibial crest as a reference building in appropriate posterior slope.  Care was taken during the cut to protect the medial and collateral ligaments.  The proximal tibia was removed along with the posterior horns of the menisci.  The PCL was sacrificed.    The extensor gap was measured and found to have adequate resection, measuring to a size 8 after I resected the tibia the second time.    The distal femoral sizing jig was applied, taking care to avoid notching.  This was set at 3 degrees of external rotation.  Then the 4-in-1 cutting jig was applied and the anterior and posterior femur was cut, along  with the chamfer cuts.  All posterior osteophytes were removed.  The flexion gap was then measured and was symmetric with the extension gap.  I completed the distal femoral preparation using the appropriate jig to prepare the box.  The proximal tibia sized and prepared accordingly with the reamer and the punch, and then all components were trialed  with the poly insert.  The knee was found to have excellent balance and full motion.    The above named components were then cemented into place and all excess cement was removed.  The real polyethylene implant was placed.  After the cement had cured I released the tourniquet and confirmed excellent hemostasis with no major posterior vessel injury.    The knee was easily taken through a range of motion and the patella tracked well and the knee irrigated copiously and the parapatellar and subcutaneous tissue closed with vicryl, and monocryl with steri strips for the skin.  The wounds were injected with marcaine, and dressed with sterile gauze and the patient was awakened and returned to the PACU in stable and satisfactory condition.  There were no complications.  Total tourniquet time was 60 minutes.

## 2020-07-26 NOTE — Anesthesia Preprocedure Evaluation (Addendum)
Anesthesia Evaluation  Patient identified by MRN, date of birth, ID band Patient awake    Reviewed: Allergy & Precautions, NPO status , Patient's Chart, lab work & pertinent test results  History of Anesthesia Complications Negative for: history of anesthetic complications  Airway Mallampati: II  TM Distance: >3 FB Neck ROM: Full    Dental  (+) Dental Advisory Given, Teeth Intact   Pulmonary neg pulmonary ROS,  Covid-19 Nucleic Acid Test Results Lab Results      Component                Value               Date                      SARSCOV2NAA              NEGATIVE            07/22/2020                [place]              NEGATIVE            01/15/2020              breath sounds clear to auscultation       Cardiovascular negative cardio ROS   Rhythm:Regular     Neuro/Psych PSYCHIATRIC DISORDERS Anxiety Depression negative neurological ROS     GI/Hepatic negative GI ROS, Neg liver ROS,   Endo/Other  negative endocrine ROS  Renal/GU negative Renal ROSLab Results      Component                Value               Date                      CREATININE               0.64                01/20/2020                BUN                      8                   01/20/2020                NA                       136                 01/20/2020                K                        4.0                 01/20/2020                CL                       102                 01/20/2020  CO2                      25                  01/20/2020                Musculoskeletal  (+) Arthritis ,   Abdominal   Peds  Hematology negative hematology ROS (+) Lab Results      Component                Value               Date                      WBC                      7.8                 07/13/2020                HGB                      12.3                07/13/2020                HCT                      39.3                 07/13/2020                MCV                      94.2                07/13/2020                PLT                      318                 07/13/2020           Lab Results      Component                Value               Date                      INR                      0.9                 03/22/2011              Anesthesia Other Findings   Reproductive/Obstetrics                            Anesthesia Physical Anesthesia Plan  ASA: 2  Anesthesia Plan: MAC, Regional and Spinal   Post-op Pain Management:    Induction:   PONV Risk Score and Plan: 2 and Propofol infusion  Airway Management Planned: Nasal Cannula  Additional Equipment: None  Intra-op Plan:   Post-operative Plan:  Informed Consent: I have reviewed the patients History and Physical, chart, labs and discussed the procedure including the risks, benefits and alternatives for the proposed anesthesia with the patient or authorized representative who has indicated his/her understanding and acceptance.     Dental advisory given  Plan Discussed with: CRNA and Surgeon  Anesthesia Plan Comments:         Anesthesia Quick Evaluation

## 2020-07-26 NOTE — Discharge Instructions (Signed)

## 2020-07-27 ENCOUNTER — Encounter (HOSPITAL_COMMUNITY): Payer: Self-pay | Admitting: Orthopedic Surgery

## 2020-07-27 ENCOUNTER — Other Ambulatory Visit (HOSPITAL_COMMUNITY): Payer: Self-pay

## 2020-07-27 DIAGNOSIS — M1712 Unilateral primary osteoarthritis, left knee: Secondary | ICD-10-CM | POA: Diagnosis not present

## 2020-07-27 LAB — BASIC METABOLIC PANEL
Anion gap: 3 — ABNORMAL LOW (ref 5–15)
Anion gap: 7 (ref 5–15)
BUN: 13 mg/dL (ref 6–20)
BUN: 14 mg/dL (ref 6–20)
CO2: 27 mmol/L (ref 22–32)
CO2: 26 mmol/L (ref 22–32)
Calcium: 8.4 mg/dL — ABNORMAL LOW (ref 8.9–10.3)
Calcium: 8.4 mg/dL — ABNORMAL LOW (ref 8.9–10.3)
Chloride: 107 mmol/L (ref 98–111)
Chloride: 103 mmol/L (ref 98–111)
Creatinine, Ser: 0.69 mg/dL (ref 0.44–1.00)
Creatinine, Ser: 0.72 mg/dL (ref 0.44–1.00)
GFR, Estimated: >60 mL/min (ref >60)
GFR, Estimated: >60 mL/min (ref >60)
Glucose, Bld: 144 mg/dL — ABNORMAL HIGH (ref 70–99)
Glucose, Bld: 114 mg/dL — ABNORMAL HIGH (ref 70–99)
Potassium: 4.6 mmol/L (ref 3.5–5.1)
Potassium: 4 mmol/L (ref 3.5–5.1)
Sodium: 137 mmol/L (ref 135–145)
Sodium: 136 mmol/L (ref 135–145)

## 2020-07-27 LAB — CBC
HCT: 35.7 % — ABNORMAL LOW (ref 36.0–46.0)
Hemoglobin: 11 g/dL — ABNORMAL LOW (ref 12.0–15.0)
MCH: 29.5 pg (ref 26.0–34.0)
MCHC: 30.8 g/dL (ref 30.0–36.0)
MCV: 95.7 fL (ref 80.0–100.0)
Platelets: 244 10*3/uL (ref 150–400)
RBC: 3.73 MIL/uL — ABNORMAL LOW (ref 3.87–5.11)
RDW: 13.2 % (ref 11.5–15.5)
WBC: 10.2 10*3/uL (ref 4.0–10.5)
nRBC: 0 % (ref 0.0–0.2)

## 2020-07-27 NOTE — Progress Notes (Signed)
Physical Therapy Treatment Patient Details Name: Laura Brown MRN: [id number] DOB: January 09, 1971 Today's Date: 07/27/2020    History of Present Illness 50 y.o. female admitted 07/26/20 for L TKA. PMH includes R TKA 01/19/20, OA, depression, anxiety.    PT Comments    Pt now is able to perform a L straight leg raise independently. She ambulated 100' with RW without a knee immobilizer, no buckling. Stair training completed. She is ready to DC home from PT standpoint.    Follow Up Recommendations  Follow surgeon's recommendation for DC plan and follow-up therapies     Equipment Recommendations  None recommended by PT    Recommendations for Other Services       Precautions / Restrictions Precautions Precautions: Knee Precaution Comments: reviewed no pillow under knee Required Braces or Orthoses: Knee Immobilizer - Left Knee Immobilizer - Left: Discontinue once straight leg raise with < 10 degree lag Restrictions Weight Bearing Restrictions: No Other Position/Activity Restrictions: WBAT    Mobility  Bed Mobility Overal bed mobility: Modified Independent             General bed mobility comments: used rail, HOB up    Transfers Overall transfer level: Needs assistance Equipment used: Rolling walker (2 wheeled) Transfers: Sit to/from Stand Sit to Stand: Supervision         General transfer comment: VCs hand placement  Ambulation/Gait Ambulation/Gait assistance: Supervision Gait Distance (Feet): 100 Feet Assistive device: Rolling walker (2 wheeled) Gait Pattern/deviations: Step-to pattern Gait velocity: decr   General Gait Details: pt now able to do straight leg raise independently, she ambulated without knee immobilizer without any buckling   Stairs Stairs: Yes Stairs assistance: Min guard Stair Management: Step to pattern;With cane;One rail Left Number of Stairs: 3 General stair comments: VCs sequencing, min/guard safety   Wheelchair Mobility     Modified Rankin (Stroke Patients Only)       Balance Overall balance assessment: Modified Independent                                          Cognition Arousal/Alertness: Awake/alert Behavior During Therapy: WFL for tasks assessed/performed Overall Cognitive Status: Within Functional Limits for tasks assessed                                        Exercises Total Joint Exercises Ankle Circles/Pumps: AROM;Both;10 reps;Supine Quad Sets: AROM;Supine;Both;10 reps (very weak L quad contraction, only  a brief twitch noted with attempted quad set) Short Arc Quad: AAROM;Left;10 reps;Supine Heel Slides: AAROM;Left;10 reps;Supine Hip ABduction/ADduction: AAROM;Left;10 reps;Supine Straight Leg Raises: AAROM;Left;5 reps;Supine Long Arc Quad: Left;5 reps;Seated;AAROM Goniometric ROM: 0-90* AAROm L knee    General Comments        Pertinent Vitals/Pain Pain Score: 6  Pain Location: L knee Pain Descriptors / Indicators: Sore Pain Intervention(s): Limited activity within patient's tolerance;Monitored during session;Premedicated before session;Ice applied    Home Living Family/patient expects to be discharged to:: Private residence Living Arrangements: Children Available Help at Discharge: Family Type of Home: House Home Access: Stairs to enter Entrance Stairs-Rails: Left Home Layout: One level Home Equipment: Environmental consultant - 2 wheels;Cane - single point;Shower seat;Bedside commode Additional Comments: pt's mom lives next door and can assist as needed. Daughter will assist also.    Prior Function Level of Independence: Independent  Comments: walked without AD, no falls in 6 months   PT Goals (current goals can now be found in the care plan section) Acute Rehab PT Goals Patient Stated Goal: hike, go up stairs PT Goal Formulation: With patient Time For Goal Achievement: 08/02/20 Potential to Achieve Goals: Good Progress towards PT goals:  Progressing toward goals    Frequency    7X/week      PT Plan Current plan remains appropriate    Co-evaluation              AM-PAC PT "6 Clicks" Mobility   Outcome Measure  Help needed turning from your back to your side while in a flat bed without using bedrails?: A Little Help needed moving from lying on your back to sitting on the side of a flat bed without using bedrails?: A Little Help needed moving to and from a bed to a chair (including a wheelchair)?: None Help needed standing up from a chair using your arms (e.g., wheelchair or bedside chair)?: None Help needed to walk in hospital room?: None Help needed climbing 3-5 steps with a railing? : A Little 6 Click Score: 21    End of Session Equipment Utilized During Treatment: Gait belt Activity Tolerance: Patient tolerated treatment well Patient left: with call bell/phone within reach;in bed;with bed alarm set Nurse Communication: Mobility status PT Visit Diagnosis: Difficulty in walking, not elsewhere classified (R26.2);Pain Pain - Right/Left: Left Pain - part of body: Knee     Time: 1025-8527 PT Time Calculation (min) (ACUTE ONLY): 23 min  Charges:  $Gait Training: 8-22 mins  $Therapeutic Activity: 8-22 mins                    Ralene Bathe Kistler PT 07/27/2020  Acute Rehabilitation Services Pager [phone] Office [phone]

## 2020-07-27 NOTE — TOC Transition Note (Signed)
Transition of Care [facility]) - CM/SW Discharge Note   Patient Details  Name: JEYDA SIEBEL MRN: [id number] Date of Birth: [birth year]  [facility] of [facility]) CM/SW Contact:  Lennart Pall, LCSW Phone Number: 07/27/2020, 10:28 AM   Clinical Narrative:    Met briefly with pt and confirming she has all needed DME at home.  Plan for OPPT at [facility] in [facility]. No TOC needs.   Final next level of care: OP Rehab Barriers to Discharge: No Barriers Identified   Patient Goals and CMS Choice Patient states their goals for this hospitalization and ongoing recovery are:: return home      Discharge Placement                       Discharge Plan and Services                DME Arranged: N/A DME Agency: NA                  Social Determinants of Health (SDOH) Interventions     Readmission Risk Interventions No flowsheet data found.

## 2020-07-27 NOTE — Progress Notes (Signed)
Physical Therapy Treatment Patient Details Name: Laura Brown MRN: [id number] DOB: January 17, 1971 Today's Date: 07/27/2020    History of Present Illness 50 y.o. female admitted 07/26/20 for L TKA. PMH includes R TKA 01/19/20, OA, depression, anxiety.    PT Comments    Pt has very weak L quadriceps contraction, only a brief twitch is noted with attempted isometric quad set. SHe is unable to perform a SLR. Knee immobilizer placed for ambulation of 100'. Instructed pt in TKA HEP. Will plan a second session for this afternoon for stair training.    Follow Up Recommendations  Follow surgeon's recommendation for DC plan and follow-up therapies     Equipment Recommendations  None recommended by PT    Recommendations for Other Services       Precautions / Restrictions Precautions Precautions: Knee Precaution Comments: reviewed no pillow under knee Required Braces or Orthoses: Knee Immobilizer - Left Knee Immobilizer - Left: Discontinue once straight leg raise with < 10 degree lag Restrictions Weight Bearing Restrictions: No    Mobility  Bed Mobility Overal bed mobility: Modified Independent             General bed mobility comments: used rail, HOB up    Transfers Overall transfer level: Needs assistance Equipment used: Rolling walker (2 wheeled) Transfers: Sit to/from Stand Sit to Stand: Min guard         General transfer comment: VCs hand placement  Ambulation/Gait Ambulation/Gait assistance: Min guard Gait Distance (Feet): 100 Feet Assistive device: Rolling walker (2 wheeled) Gait Pattern/deviations: Step-to pattern Gait velocity: decr   General Gait Details: KI on LLE 2* significant quad weakness (cannot perform quad set nor SLR), no loss of balance   Stairs             Wheelchair Mobility    Modified Rankin (Stroke Patients Only)       Balance Overall balance assessment: Modified Independent                                           Cognition Arousal/Alertness: Awake/alert Behavior During Therapy: WFL for tasks assessed/performed Overall Cognitive Status: Within Functional Limits for tasks assessed                                        Exercises Total Joint Exercises Ankle Circles/Pumps: AROM;Both;10 reps;Supine Quad Sets: AROM;Supine;Both;10 reps (very weak L quad contraction, only  a brief twitch noted with attempted quad set) Short Arc Quad: AAROM;Left;10 reps;Supine Heel Slides: AAROM;Left;10 reps;Supine Hip ABduction/ADduction: AAROM;Left;10 reps;Supine Straight Leg Raises: AAROM;Left;5 reps;Supine Long Arc Quad: Left;5 reps;Seated;AAROM Goniometric ROM: 0-90* AAROm L knee    General Comments        Pertinent Vitals/Pain Pain Score: 8  Pain Location: L knee Pain Descriptors / Indicators: Sore Pain Intervention(s): Limited activity within patient's tolerance;Monitored during session;Premedicated before session;Ice applied;Repositioned    Home Living Family/patient expects to be discharged to:: Private residence Living Arrangements: Children Available Help at Discharge: Family Type of Home: House Home Access: Stairs to enter Entrance Stairs-Rails: Left Home Layout: One level Home Equipment: Environmental consultant - 2 wheels;Cane - single point;Shower seat;Bedside commode Additional Comments: pt's mom lives next door and can assist as needed. Daughter will assist also.    Prior Function Level of Independence: Independent  Comments: walked without AD, no falls in 6 months   PT Goals (current goals can now be found in the care plan section) Acute Rehab PT Goals Patient Stated Goal: hike, go up stairs PT Goal Formulation: With patient Time For Goal Achievement: 08/02/20 Potential to Achieve Goals: Good Progress towards PT goals: Progressing toward goals    Frequency    7X/week      PT Plan Current plan remains appropriate    Co-evaluation              AM-PAC  PT "6 Clicks" Mobility   Outcome Measure  Help needed turning from your back to your side while in a flat bed without using bedrails?: A Little Help needed moving from lying on your back to sitting on the side of a flat bed without using bedrails?: A Little Help needed moving to and from a bed to a chair (including a wheelchair)?: A Little Help needed standing up from a chair using your arms (e.g., wheelchair or bedside chair)?: A Little Help needed to walk in hospital room?: A Little Help needed climbing 3-5 steps with a railing? : A Little 6 Click Score: 18    End of Session Equipment Utilized During Treatment: Gait belt Activity Tolerance: Patient tolerated treatment well Patient left: in chair;with call bell/phone within reach;with chair alarm set Nurse Communication: Mobility status PT Visit Diagnosis: Difficulty in walking, not elsewhere classified (R26.2);Pain Pain - Right/Left: Left Pain - part of body: Knee     Time: 3244-0102 PT Time Calculation (min) (ACUTE ONLY): 39 min  Charges:  $Gait Training: 8-22 mins $Therapeutic Exercise: 8-22 mins $Therapeutic Activity: 8-22 mins                     Ralene Bathe Kistler PT 07/27/2020  Acute Rehabilitation Services Pager [phone] Office [phone]

## 2020-07-27 NOTE — Progress Notes (Signed)
     Subjective: 1 Day Post-Op s/p Procedure(s): TOTAL KNEE ARTHROPLASTY   Patient is alert, oriented. Pain mild this morning, she is so far happy with her pain regiment.  Denies chest pain, SOB, Calf pain. No nausea/vomiting. No other complaints.    Objective:  PE: VITALS:   Vitals:   07/26/20 1853 07/26/20 2028 07/27/20 0222 07/27/20 0649  BP: 108/60 124/75 110/66 99/63  Pulse: 79 86 69 68  Resp: 16 18 18 18   Temp: 98 F (36.7 C) 98.2 F (36.8 C) (!) 97.4 F (36.3 C) (!) 97.5 F (36.4 C)  TempSrc: Oral Oral Oral Oral  SpO2: 94% 94% 94% 96%  Weight:      Height:        ABD soft Sensation intact distally Intact pulses distally Dorsiflexion/Plantar flexion intact Incision: no drainage Compartment soft  LABS  Results for orders placed or performed during the hospital encounter of 07/26/20 (from the past 24 hour(s))  CBC     Status: Abnormal   Collection Time: 07/27/20  3:14 AM  Result Value Ref Range   WBC 10.2 4.0 - 10.5 K/uL   RBC 3.73 (L) 3.87 - 5.11 MIL/uL   Hemoglobin 11.0 (L) 12.0 - 15.0 g/dL   HCT 07/29/20 (L) 98.9 - 21.1 %   MCV 95.7 80.0 - 100.0 fL   MCH 29.5 26.0 - 34.0 pg   MCHC 30.8 30.0 - 36.0 g/dL   RDW 94.1 74.0 - 81.4 %   Platelets 244 150 - 400 K/uL   nRBC 0.0 0.0 - 0.2 %  Basic metabolic panel     Status: Abnormal   Collection Time: 07/27/20  3:14 AM  Result Value Ref Range   Sodium 137 135 - 145 mmol/L   Potassium 4.6 3.5 - 5.1 mmol/L   Chloride 107 98 - 111 mmol/L   CO2 27 22 - 32 mmol/L   Glucose, Bld 144 (H) 70 - 99 mg/dL   BUN 13 6 - 20 mg/dL   Creatinine, Ser 07/29/20 0.44 - 1.00 mg/dL   Calcium 8.4 (L) 8.9 - 10.3 mg/dL   GFR, Estimated 8.56 >31 mL/min   Anion gap 3 (L) 5 - 15    DG Knee Left Port  Result Date: 07/26/2020 CLINICAL DATA:  50 year old female status post left knee surgery. EXAM: PORTABLE LEFT KNEE - 1-2 VIEW COMPARISON:  02/10/2015. FINDINGS: AP and cross-table lateral portable views of the left knee in PACU. Left total  knee arthroplasty hardware appears intact and normally aligned. Postoperative changes to the patella. No unexpected osseous changes. Small volume gas and fluid in the joint space. IMPRESSION: Left total knee arthroplasty with no adverse features. Electronically Signed   By: 04/10/2015 M.D.   On: 07/26/2020 11:19    Assessment/Plan: Active Problems:   S/P TKR (total knee replacement), left  1 Day Post-Op s/p Procedure(s): TOTAL KNEE ARTHROPLASTY  Weightbearing: WBAT LLE Insicional and dressing care: Reinforce dressings as needed VTE prophylaxis: Aspirin 325mg  BID x 30 days Pain control: continue current regimen, will d/c home on oxycodone 5mg  Follow - up plan: 2 weeks  Dispo: ok to discharge after passes stair training with PT Contact information:   Weekdays 8-5 , PA-C [phone] A fter hours and holidays please check [email or web address] for group call information for [facility]  10-02-2005 07/27/2020, 8:50 AM

## 2020-07-28 ENCOUNTER — Other Ambulatory Visit (HOSPITAL_COMMUNITY): Payer: Self-pay

## 2020-08-02 ENCOUNTER — Other Ambulatory Visit (HOSPITAL_COMMUNITY): Payer: Self-pay

## 2020-08-02 NOTE — Discharge Summary (Signed)
Discharge Summary  Patient ID: Laura Brown MRN: [id number] DOB/AGE: 1971/01/04 50 y.o.  Admit date: 07/26/2020 Discharge date: 07/27/20  Admission Diagnoses: left knee osteoarthritis Discharge Diagnoses:  Active Problems:   S/P TKR (total knee replacement), left   Past Medical History:  Diagnosis Date   Anxiety    Depression    Osteoarthritis of both knees     Surgeries: Procedure(s): TOTAL KNEE ARTHROPLASTY on 07/26/2020   Consultants (if any):   Discharged Condition: Improved  Hospital Course: Laura Brown is an 50 y.o. female who was admitted 07/26/2020 with a diagnosis of left knee osteoarthritis and went to the operating room on 07/26/2020 and underwent the above named procedures.    She was given perioperative antibiotics:  Anti-infectives (From admission, onward)    Start     Dose/Rate Route Frequency Ordered Stop   07/26/20 1400  ceFAZolin (ANCEF) IVPB 2g/100 mL premix        2 g 200 mL/hr over 30 Minutes Intravenous Every 6 hours 07/26/20 1135 07/27/20 0724   07/26/20 0600  ceFAZolin (ANCEF) IVPB 2g/100 mL premix        2 g 200 mL/hr over 30 Minutes Intravenous On call to O.R. 07/26/20 7829 07/26/20 0746     .  She was given sequential compression devices, early ambulation, and aspirin for DVT prophylaxis.  She benefited maximally from the hospital stay and there were no complications.    Recent vital signs:  Vitals:   07/27/20 0649 07/27/20 1054  BP: 99/63 (!) 95/49  Pulse: 68 62  Resp: 18 18  Temp: (!) 97.5 F (36.4 C) 97.8 F (36.6 C)  SpO2: 96% 92%    Recent laboratory studies:  Lab Results  Component Value Date   HGB 11.0 (L) 07/27/2020   HGB 12.3 07/13/2020   HGB 12.4 01/20/2020   Lab Results  Component Value Date   WBC 10.2 07/27/2020   PLT 244 07/27/2020   Lab Results  Component Value Date   INR 0.9 03/22/2011   Lab Results  Component Value Date   NA 136 07/27/2020   K 4.0 07/27/2020   CL 103 07/27/2020   CO2 26  07/27/2020   BUN 14 07/27/2020   CREATININE 0.72 07/27/2020   GLUCOSE 114 (H) 07/27/2020    Discharge Medications:   Allergies as of 07/27/2020   No Known Allergies      Medication List     STOP taking these medications    Otezla 30 MG Tabs Generic drug: Apremilast       TAKE these medications    acetaminophen 500 MG tablet Commonly known as: TYLENOL Take 1,000 mg by mouth every 6 (six) hours as needed for moderate pain.   aspirin EC 325 MG tablet Take 1 tablet (325 mg total) by mouth 2 (two) times daily.   baclofen 10 MG tablet Commonly known as: LIORESAL Take 1 tablet (10 mg total) by mouth 3 times daily as needed for muscle spasm   BD Pen Needle Nano 2nd Gen 32G X 4 MM Misc Generic drug: Insulin Pen Needle 1 Device by Does not apply route daily.   buPROPion 200 MG 12 hr tablet Commonly known as: WELLBUTRIN SR Take 1 tablet (200 mg total) by mouth 2 (two) times daily.   cholecalciferol 25 MCG (1000 UNIT) tablet Commonly known as: VITAMIN D3 Take 2,000 Units by mouth daily.   DULoxetine 60 MG capsule Commonly known as: CYMBALTA Take 120 mg by mouth daily.   ondansetron  4 MG tablet Commonly known as: Zofran Take 1 tablet (4 mg total) by mouth every 8 (eight) hours as needed for nausea or vomiting.   oxyCODONE 5 MG immediate release tablet Commonly known as: Roxicodone Take 1 tablet (5 mg total) by mouth every 4 (four) hours as needed for severe pain.   Saxenda 18 MG/3ML Sopn Generic drug: Liraglutide -Weight Management INJECT 3 MG INTO THE SKIN DAILY.   sennosides-docusate sodium 8.6-50 MG tablet Commonly known as: SENOKOT-S Take 2 tablets by mouth daily.       ASK your doctor about these medications    Vitamin D 50 MCG (2000 UT) Caps Take 1 capsule (2,000 Units total) by mouth daily.   fexofenadine 180 MG tablet Commonly known as: ALLEGRA Take 1 tablet (180 mg total) by mouth daily.   fluticasone 50 MCG/ACT nasal spray Commonly known  as: FLONASE Place 2 sprays into both nostrils daily.   verapamil 100 MG 24 hr capsule Commonly known as: VERELAN Take 2 capsules by mouth daily.   Vitamin D (Ergocalciferol) 1.25 MG (50000 UNIT) Caps capsule Commonly known as: DRISDOL TAKE 1 CAPSULE (50,000 UNITS TOTAL) BY MOUTH EVERY 7 (SEVEN) DAYS.        Diagnostic Studies: DG Knee Left Port  Result Date: 07/26/2020 CLINICAL DATA:  50 year old female status post left knee surgery. EXAM: PORTABLE LEFT KNEE - 1-2 VIEW COMPARISON:  02/10/2015. FINDINGS: AP and cross-table lateral portable views of the left knee in PACU. Left total knee arthroplasty hardware appears intact and normally aligned. Postoperative changes to the patella. No unexpected osseous changes. Small volume gas and fluid in the joint space. IMPRESSION: Left total knee arthroplasty with no adverse features. Electronically Signed   By: Odessa Fleming M.D.   On: 07/26/2020 11:19    Disposition: Discharge disposition: 01-Home or Self Care          Follow-up Information     Teryl Lucy, MD. Schedule an appointment as soon as possible for a visit in 2 week(s).   Specialty: Orthopedic Surgery Contact information: [address] [place] [address] [phone]                  Signed: Annita Brod 08/02/2020, 1:59 PM

## 2020-08-03 ENCOUNTER — Other Ambulatory Visit (HOSPITAL_COMMUNITY): Payer: Self-pay

## 2020-08-03 ENCOUNTER — Encounter (HOSPITAL_COMMUNITY): Payer: Self-pay | Admitting: Orthopedic Surgery

## 2020-08-03 NOTE — Anesthesia Postprocedure Evaluation (Signed)
Anesthesia Post Note  Patient: Laura Brown  Procedure(s) Performed: TOTAL KNEE ARTHROPLASTY (Left: Knee)     Patient location during evaluation: PACU Anesthesia Type: Regional, MAC and Spinal Level of consciousness: awake and alert Pain management: pain level controlled Vital Signs Assessment: post-procedure vital signs reviewed and stable Respiratory status: spontaneous breathing, nonlabored ventilation, respiratory function stable and patient connected to nasal cannula oxygen Cardiovascular status: blood pressure returned to baseline and stable Postop Assessment: no apparent nausea or vomiting Anesthetic complications: no   No notable events documented.  Last Vitals:  Vitals:   07/27/20 0649 07/27/20 1054  BP: 99/63 (!) 95/49  Pulse: 68 62  Resp: 18 18  Temp: (!) 36.4 C 36.6 C  SpO2: 96% 92%    Last Pain:  Vitals:   07/27/20 1230  TempSrc:   PainSc: 3                  Edmundo Tedesco

## 2020-08-04 ENCOUNTER — Other Ambulatory Visit (HOSPITAL_COMMUNITY): Payer: Self-pay

## 2020-08-04 MED ORDER — DULOXETINE HCL 60 MG PO CPEP
ORAL_CAPSULE | Freq: Every day | ORAL | 1 refills | Status: DC
  Filled 2020-08-04: qty 180, 90d supply, fill #0

## 2020-08-08 ENCOUNTER — Other Ambulatory Visit (HOSPITAL_COMMUNITY): Payer: Self-pay

## 2020-08-08 MED ORDER — HYDROCODONE-ACETAMINOPHEN 5-325 MG PO TABS
ORAL_TABLET | ORAL | 0 refills | Status: DC
  Filled 2020-08-08: qty 30, 5d supply, fill #0

## 2020-08-09 ENCOUNTER — Other Ambulatory Visit (HOSPITAL_COMMUNITY): Payer: Self-pay

## 2020-08-11 ENCOUNTER — Other Ambulatory Visit (HOSPITAL_COMMUNITY): Payer: Self-pay

## 2020-08-11 ENCOUNTER — Other Ambulatory Visit: Payer: Self-pay

## 2020-08-11 ENCOUNTER — Other Ambulatory Visit (HOSPITAL_BASED_OUTPATIENT_CLINIC_OR_DEPARTMENT_OTHER): Payer: Self-pay

## 2020-08-11 MED ORDER — OTEZLA 30 MG PO TABS
ORAL_TABLET | ORAL | 0 refills | Status: DC
  Filled 2020-08-11: qty 60, 30d supply, fill #0

## 2020-08-12 ENCOUNTER — Other Ambulatory Visit (HOSPITAL_BASED_OUTPATIENT_CLINIC_OR_DEPARTMENT_OTHER): Payer: Self-pay

## 2020-08-15 ENCOUNTER — Other Ambulatory Visit (HOSPITAL_COMMUNITY): Payer: Self-pay

## 2020-08-15 ENCOUNTER — Other Ambulatory Visit: Payer: Self-pay | Admitting: Pharmacist

## 2020-08-15 ENCOUNTER — Other Ambulatory Visit (HOSPITAL_BASED_OUTPATIENT_CLINIC_OR_DEPARTMENT_OTHER): Payer: Self-pay

## 2020-08-15 MED ORDER — OTEZLA 30 MG PO TABS
ORAL_TABLET | ORAL | 0 refills | Status: DC
  Filled 2020-08-15: qty 60, 30d supply, fill #0

## 2020-08-22 ENCOUNTER — Other Ambulatory Visit (HOSPITAL_COMMUNITY): Payer: Self-pay

## 2020-08-29 ENCOUNTER — Other Ambulatory Visit (HOSPITAL_COMMUNITY): Payer: Self-pay

## 2020-08-30 ENCOUNTER — Telehealth: Payer: No Typology Code available for payment source | Admitting: Physician Assistant

## 2020-08-30 DIAGNOSIS — Z20822 Contact with and (suspected) exposure to covid-19: Secondary | ICD-10-CM

## 2020-08-30 MED ORDER — BENZONATATE 100 MG PO CAPS: 100.0000 mg | ORAL_CAPSULE | Freq: Three times a day (TID) | ORAL | 0 refills | Status: DC | PRN

## 2020-08-30 NOTE — Progress Notes (Signed)
E-Visit for Corona Virus Screening  Your current symptoms could be consistent with the coronavirus.  Many health care providers can now test patients at their office but not all are.  [facility] has multiple testing sites. For information on our COVID testing locations and hours go to [email or web address]  We are enrolling you in our MyChart Home Monitoring for COVID19 . Daily you will receive a questionnaire within the MyChart website. Our COVID 19 response team will be monitoring your responses daily.  Testing Information: The COVID-19 Community Testing sites are testing BY APPOINTMENT ONLY.  You can schedule online at [email or web address]  If you do not have access to a smart phone or computer you may call [phone] for an appointment.   Additional testing sites in the Community:  For [facility] Testing sites in [place]  [email or web address]  For Pop-up testing sites in [place]  [email or web address]  For [facility] [email or web address]  For [facility] testing in [place] and [employer] [email or web address]  For [facility] testing in [place]   [email or web address]  For  more information about community testing call [phone]   Please quarantine yourself while awaiting your test results. Please stay home for a minimum of 10 days from the first day of illness with improving symptoms and you have had 24 hours of no fever (without the use of Tylenol (Acetaminophen) Motrin (Ibuprofen) or any fever reducing medication).  Also - Do not get tested prior to returning to work because once you have had a positive test the test can stay positive for  more than a month in some cases.   You should wear a mask or cloth face covering over your nose and mouth if you must be around other people or animals, including pets (even at home). Try to stay at least 6 feet away from other people. This will protect the people around you.  Please continue good preventive care measures, including:  frequent hand-washing, avoid touching your face, cover coughs/sneezes, stay out of crowds and keep a 6 foot distance from others.  COVID-19 is a respiratory illness with symptoms that are similar to the flu. Symptoms are typically mild to moderate, but there have been cases of severe illness and death due to the virus.   The following symptoms may appear 2-14 days after exposure: Fever Cough Shortness of breath or difficulty breathing Chills Repeated shaking with chills Muscle pain Headache Sore throat New loss of taste or smell Fatigue Congestion or runny nose Nausea or vomiting Diarrhea  Go to the nearest hospital ED for assessment if fever/cough/breathlessness are severe or illness seems like a threat to life.  It is vitally important that if you feel that you have an infection such as this virus or any other virus that you stay home and away from places where you may spread it to others.  You should avoid contact with people age 33 and older.   You can use medication such as prescription cough medication called Tessalon Perles 100 mg. You may take 1-2 capsules every 8 hours as needed for cough  You may also take acetaminophen (Tylenol) as needed for fever.  Continue your allegra and add back in your flonase (fluticasone)  Reduce your risk of any infection by using the same precautions used for avoiding the common cold or flu:  Wash your hands often with soap and warm water for at least 20 seconds.  If soap and water are not readily available, use an alcohol-based hand sanitizer with at  least 60% alcohol.  If coughing or sneezing, cover your mouth and  nose by coughing or sneezing into the elbow areas of your shirt or coat, into a tissue or into your sleeve (not your hands). Avoid shaking hands with others and consider head nods or verbal greetings only. Avoid touching your eyes, nose, or mouth with unwashed hands.  Avoid close contact with people who are sick. Avoid places or events with large numbers of people in one location, like concerts or sporting events. Carefully consider travel plans you have or are making. If you are planning any travel outside or inside the [place], visit the CDC's Travelers' Health webpage for the latest health notices. If you have some symptoms but not all symptoms, continue to monitor at home and seek medical attention if your symptoms worsen. If you are having a medical emergency, call 911.  HOME CARE Only take medications as instructed by your medical team. Drink plenty of fluids and get plenty of rest. A steam or ultrasonic humidifier can help if you have congestion.   GET HELP RIGHT AWAY IF YOU HAVE EMERGENCY WARNING SIGNS** FOR COVID-19. If you or someone is showing any of these signs seek emergency medical care immediately. Call 911 or proceed to your closest emergency facility if: You develop worsening high fever. Trouble breathing Bluish lips or face Persistent pain or pressure in the chest New confusion Inability to wake or stay awake You cough up blood. Your symptoms become more severe  **This list is not all possible symptoms. Contact your medical provider for any symptoms that are sever or concerning to you.  MAKE SURE YOU  Understand these instructions. Will watch your condition. Will get help right away if you are not doing well or get worse.  Your e-visit answers were reviewed by a board certified advanced clinical practitioner to complete your personal care plan.  Depending on the condition, your plan could have included both over the counter or prescription medications.  If there is a  problem please reply once you have received a response from your provider.  Your safety is important to [place].  If you have drug allergies check your prescription carefully.    You can use MyChart to ask questions about today's visit, request a non-urgent call back, or ask for a work or school excuse for 24 hours related to this e-Visit. If it has been greater than 24 hours you will need to follow up with your provider, or enter a new e-Visit to address those concerns. You will get an e-mail in the next two days asking about your experience.  I hope that your e-visit has been valuable and will speed your recovery. Thank you for using e-visits.  I provided 5 minutes of non face-to-face time during this encounter for chart review and documentation.

## 2020-09-01 ENCOUNTER — Telehealth: Payer: No Typology Code available for payment source | Admitting: Physician Assistant

## 2020-09-01 DIAGNOSIS — U071 COVID-19: Secondary | ICD-10-CM

## 2020-09-01 MED ORDER — FLUTICASONE PROPIONATE 50 MCG/ACT NA SUSP: 2.0000 | Freq: Every day | NASAL | 0 refills | Status: DC

## 2020-09-01 MED ORDER — MOLNUPIRAVIR EUA 200MG CAPSULE: 4.0000 | ORAL_CAPSULE | Freq: Two times a day (BID) | ORAL | 0 refills | Status: AC

## 2020-09-01 NOTE — Progress Notes (Signed)
E-Visit for Corona Virus Screening  Your current symptoms could be consistent with the coronavirus.  Many health care providers can now test patients at their office but not all are.  [facility] has multiple testing sites. For information on our COVID testing locations and hours go to [email or web address]  We are enrolling you in our MyChart Home Monitoring for COVID19 . Daily you will receive a questionnaire within the MyChart website. Our COVID 19 response team will be monitoring your responses daily.  Testing Information: The COVID-19 Community Testing sites are testing BY APPOINTMENT ONLY.  You can schedule online at [email or web address]  If you do not have access to a smart phone or computer you may call [phone] for an appointment.   Additional testing sites in the Community:  For [facility] Testing sites in [place]  [email or web address]  For Pop-up testing sites in [place]  [email or web address]  For [facility] [email or web address]  For [facility] testing in [place] and [employer] [email or web address].dk  For [facility] testing in [place]   [email or web address]  For  more information about community testing call [phone]   Please quarantine yourself while awaiting your test results. Please stay home for a minimum of 10 days from the first day of illness with improving symptoms and you have had 24 hours of no fever (without the use of Tylenol (Acetaminophen) Motrin (Ibuprofen) or any fever reducing medication).  Also - Do not get tested prior to returning to work because once you have had a positive test the test can stay positive for  more than a month in some cases.   You should wear a mask or cloth face covering over your nose and mouth if you must be around other people or animals, including pets (even at home). Try to stay at least 6 feet away from other people. This will protect the people around you.  Please continue good preventive care measures, including:  frequent hand-washing, avoid touching your face, cover coughs/sneezes, stay out of crowds and keep a 6 foot distance from others.  COVID-19 is a respiratory illness with symptoms that are similar to the flu. Symptoms are typically mild to moderate, but there have been cases of severe illness and death due to the virus.   The following symptoms may appear 2-14 days after exposure: Fever Cough Shortness of breath or difficulty breathing Chills Repeated shaking with chills Muscle pain Headache Sore throat New loss of taste or smell Fatigue Congestion or runny nose Nausea or vomiting Diarrhea  Go to the nearest hospital ED for assessment if fever/cough/breathlessness are severe or illness seems like a threat to life.  It is vitally important that if you feel that you have an infection such as this virus or any other virus that you stay home and away from places where you may spread it to others.  You should avoid contact with people age 67 and older.   You can use medication such as prescription for Fluticasone nasal spray 2 sprays in each nostril one time per day. If you are not currently taking your prescribed Flonase, I would recommend taking it. I have written a prescription in case you have run out.  Using fluticasone will help your sinuses drain and should help your ear pain.   You may also take acetaminophen (Tylenol) and motrin as needed for ear pain. The ear pain is likely related to your current COVID infection.  I have also prescribed Molnupirovir which is a medication used to treat covid.  This should help your symptoms as well.   Reduce your risk of  any infection by using the same precautions used for avoiding the common cold or flu:  Wash your hands often with soap and warm water for at least 20 seconds.  If soap and water are not readily available, use an alcohol-based hand sanitizer with at least 60% alcohol.  If coughing or sneezing, cover your mouth and nose by coughing or sneezing into the elbow areas of your shirt or coat, into a tissue or into your sleeve (not your hands). Avoid shaking hands with others and consider head nods or verbal greetings only. Avoid touching your eyes, nose, or mouth with unwashed hands.  Avoid close contact with people who are sick. Avoid places or events with large numbers of people in one location, like concerts or sporting events. Carefully consider travel plans you have or are making. If you are planning any travel outside or inside the [place], visit the CDC's Travelers' Health webpage for the latest health notices. If you have some symptoms but not all symptoms, continue to monitor at home and seek medical attention if your symptoms worsen. If you are having a medical emergency, call 911.  HOME CARE Only take medications as instructed by your medical team. Drink plenty of fluids and get plenty of rest. A steam or ultrasonic humidifier can help if you have congestion.   GET HELP RIGHT AWAY IF YOU HAVE EMERGENCY WARNING SIGNS** FOR COVID-19. If you or someone is showing any of these signs seek emergency medical care immediately. Call 911 or proceed to your closest emergency facility if: You develop worsening high fever. Trouble breathing Bluish lips or face Persistent pain or pressure in the chest New confusion Inability to wake or stay awake You cough up blood. Your symptoms become more severe  **This list is not all possible symptoms. Contact your medical provider for any symptoms that are sever or concerning to you.  MAKE SURE YOU  Understand these instructions. Will watch your condition. Will  get help right away if you are not doing well or get worse.  Your e-visit answers were reviewed by a board certified advanced clinical practitioner to complete your personal care plan.  Depending on the condition, your plan could have included both over the counter or prescription medications.  If there is a problem please reply once you have received a response from your provider.  Your safety is important to [place].  If you have drug allergies check your prescription carefully.    You can use MyChart to ask questions about today's visit, request a non-urgent call back, or ask for a work or school excuse for 24 hours related to this e-Visit. If it has been greater than 24 hours you will need to follow up with your provider, or enter a new e-Visit to address those concerns. You will get an e-mail in the next two days asking about your experience.  I hope that your e-visit has been valuable and will speed your recovery. Thank you for using e-visits.  Approximately 5 minutes was spent documenting and reviewing patient's chart.

## 2020-09-06 ENCOUNTER — Other Ambulatory Visit (HOSPITAL_COMMUNITY): Payer: Self-pay

## 2020-09-06 ENCOUNTER — Other Ambulatory Visit: Payer: Self-pay

## 2020-09-10 ENCOUNTER — Other Ambulatory Visit (HOSPITAL_COMMUNITY): Payer: Self-pay

## 2020-09-12 ENCOUNTER — Other Ambulatory Visit: Payer: Self-pay

## 2020-09-12 ENCOUNTER — Other Ambulatory Visit (HOSPITAL_COMMUNITY): Payer: Self-pay

## 2020-09-13 ENCOUNTER — Other Ambulatory Visit: Payer: Self-pay | Admitting: Pharmacist

## 2020-09-13 ENCOUNTER — Other Ambulatory Visit (HOSPITAL_COMMUNITY): Payer: Self-pay

## 2020-09-13 ENCOUNTER — Other Ambulatory Visit: Payer: Self-pay

## 2020-09-13 MED ORDER — APREMILAST 30 MG PO TABS
ORAL_TABLET | ORAL | 0 refills | Status: DC
  Filled 2020-09-13: qty 60, fill #0

## 2020-09-13 MED ORDER — OTEZLA 30 MG PO TABS
ORAL_TABLET | ORAL | 0 refills | Status: DC
  Filled 2020-09-13: qty 60, 30d supply, fill #0

## 2020-09-21 ENCOUNTER — Other Ambulatory Visit (HOSPITAL_BASED_OUTPATIENT_CLINIC_OR_DEPARTMENT_OTHER): Payer: Self-pay

## 2020-09-22 ENCOUNTER — Other Ambulatory Visit (HOSPITAL_BASED_OUTPATIENT_CLINIC_OR_DEPARTMENT_OTHER): Payer: Self-pay

## 2020-09-22 MED ORDER — DULOXETINE HCL 60 MG PO CPEP
120.0000 mg | ORAL_CAPSULE | Freq: Every day | ORAL | 3 refills | Status: DC
  Filled 2020-09-22 – 2020-11-04 (×2): qty 180, 90d supply, fill #0

## 2020-09-22 MED ORDER — BUPROPION HCL ER (SR) 200 MG PO TB12
ORAL_TABLET | ORAL | 3 refills | Status: DC
  Filled 2020-09-22: qty 180, 90d supply, fill #0

## 2020-09-22 MED ORDER — VERAPAMIL HCL ER 100 MG PO CP24
200.0000 mg | ORAL_CAPSULE | Freq: Every day | ORAL | 3 refills | Status: DC
  Filled 2020-09-22: qty 180, 90d supply, fill #0

## 2020-09-22 MED ORDER — OTEZLA 30 MG PO TABS
ORAL_TABLET | ORAL | 11 refills | Status: DC
  Filled 2020-09-22: qty 60, 30d supply, fill #0

## 2020-09-23 ENCOUNTER — Other Ambulatory Visit (HOSPITAL_BASED_OUTPATIENT_CLINIC_OR_DEPARTMENT_OTHER): Payer: Self-pay

## 2020-10-06 ENCOUNTER — Other Ambulatory Visit (HOSPITAL_COMMUNITY): Payer: Self-pay

## 2020-10-06 ENCOUNTER — Other Ambulatory Visit: Payer: Self-pay | Admitting: Pharmacist

## 2020-10-06 MED ORDER — OTEZLA 30 MG PO TABS
ORAL_TABLET | ORAL | 11 refills | Status: DC
  Filled 2020-10-06: qty 60, fill #0
  Filled 2020-10-11: qty 60, 30d supply, fill #0
  Filled 2020-11-04: qty 60, 30d supply, fill #1

## 2020-10-11 ENCOUNTER — Other Ambulatory Visit (HOSPITAL_COMMUNITY): Payer: Self-pay

## 2020-10-18 ENCOUNTER — Other Ambulatory Visit (HOSPITAL_BASED_OUTPATIENT_CLINIC_OR_DEPARTMENT_OTHER): Payer: Self-pay

## 2020-10-18 MED ORDER — CARESTART COVID-19 HOME TEST VI KIT
PACK | 0 refills | Status: DC
  Filled 2020-10-18: qty 4, 4d supply, fill #0

## 2020-10-19 ENCOUNTER — Other Ambulatory Visit (HOSPITAL_BASED_OUTPATIENT_CLINIC_OR_DEPARTMENT_OTHER): Payer: Self-pay

## 2020-11-04 ENCOUNTER — Other Ambulatory Visit (HOSPITAL_COMMUNITY): Payer: Self-pay

## 2020-11-08 ENCOUNTER — Other Ambulatory Visit (HOSPITAL_COMMUNITY): Payer: Self-pay

## 2020-11-21 ENCOUNTER — Other Ambulatory Visit: Payer: Self-pay

## 2020-11-21 ENCOUNTER — Encounter (INDEPENDENT_AMBULATORY_CARE_PROVIDER_SITE_OTHER): Payer: Self-pay | Admitting: Family Medicine

## 2020-11-21 ENCOUNTER — Ambulatory Visit (INDEPENDENT_AMBULATORY_CARE_PROVIDER_SITE_OTHER): Payer: No Typology Code available for payment source | Admitting: Family Medicine

## 2020-11-21 ENCOUNTER — Ambulatory Visit: Payer: No Typology Code available for payment source | Attending: Family Medicine | Admitting: Pharmacist

## 2020-11-21 ENCOUNTER — Other Ambulatory Visit (HOSPITAL_BASED_OUTPATIENT_CLINIC_OR_DEPARTMENT_OTHER): Payer: Self-pay

## 2020-11-21 VITALS — Temp 98.2°F | Ht 66.0 in | Wt 236.0 lb

## 2020-11-21 DIAGNOSIS — E559 Vitamin D deficiency, unspecified: Secondary | ICD-10-CM | POA: Diagnosis not present

## 2020-11-21 DIAGNOSIS — Z6841 Body Mass Index (BMI) 40.0 and over, adult: Secondary | ICD-10-CM

## 2020-11-21 DIAGNOSIS — Z79899 Other long term (current) drug therapy: Secondary | ICD-10-CM

## 2020-11-21 DIAGNOSIS — Z9189 Other specified personal risk factors, not elsewhere classified: Secondary | ICD-10-CM | POA: Diagnosis not present

## 2020-11-21 DIAGNOSIS — R632 Polyphagia: Secondary | ICD-10-CM

## 2020-11-21 MED ORDER — SAXENDA 18 MG/3ML ~~LOC~~ SOPN
0.6000 mg | PEN_INJECTOR | Freq: Every day | SUBCUTANEOUS | 0 refills | Status: DC
  Filled 2020-11-21: qty 3, 30d supply, fill #0

## 2020-11-21 MED ORDER — VITAMIN D (ERGOCALCIFEROL) 1.25 MG (50000 UNIT) PO CAPS
ORAL_CAPSULE | ORAL | 0 refills | Status: DC
  Filled 2020-11-21: qty 12, 84d supply, fill #0

## 2020-11-21 NOTE — Progress Notes (Signed)
Chief Complaint:   OBESITY Laura Brown is here to discuss her progress with her obesity treatment plan along with follow-up of her obesity related diagnoses. Laura Brown is on keeping a food journal and adhering to recommended goals of 1200-1400 calories and 80 grams of protein daily and states she is following her eating plan approximately 50% of the time. Laura Brown states she is walking 5 times per week.   Today's visit was #: 40 Starting weight: 296 lbs Starting date: 11/07/2017 Today's weight: 236 lbs Today's date: 11/21/2020 Total lbs lost to date: 60 Total lbs lost since last in-office visit: 0  Interim History: Laura Brown's last visit was approximately 6 months ago. She has a lot going on including surgery and has gotten off track. She is ready to get back to her weight loss efforts.   Subjective:   1. Polyphagia Laura Brown has been off Saxenda for approximately 2 months. She notes increased polyphagia even in the morning.  2. Vitamin D deficiency Laura Brown is off Vit D prescription, and she is still on OTC Vit D 2,000 IU daily.  3. At risk for diabetes mellitus Laura Brown is at higher than average risk for developing diabetes due to obesity.   Assessment/Plan:   1. Polyphagia Intensive lifestyle modifications are the first line treatment for this issue. We discussed several lifestyle modifications today. Laura Brown agreed to restart Saxenda 3.0 mg with no refills, (she is to taper up). She will continue to work on diet, exercise and weight loss efforts. Orders and follow up as documented in patient record.  - Liraglutide -Weight Management (SAXENDA) 18 MG/3ML SOPN; Inject 0.6 mg into the skin daily with breakfast.  Dispense: 3 mL; Refill: 0  2. Vitamin D deficiency Low Vitamin D level contributes to fatigue and are associated with obesity, breast, and colon cancer. We will refill prescription Vitamin D and we will refill for 90 days with no refills. We will recheck labs in approximately 1 month.  Laura Brown will follow-up for routine testing of Vitamin D, at least 2-3 times per year to avoid over-replacement.  - Vitamin D, Ergocalciferol, (DRISDOL) 1.25 MG (50000 UNIT) CAPS capsule; TAKE 1 CAPSULE (50,000 UNITS TOTAL) BY MOUTH EVERY 7 (SEVEN) DAYS.  Dispense: 12 capsule; Refill: 0  3. At risk for diabetes mellitus Laura Brown was given approximately 15 minutes of diabetes education and counseling today. We discussed intensive lifestyle modifications today with an emphasis on weight loss as well as increasing exercise and decreasing simple carbohydrates in her diet. We also reviewed medication options with an emphasis on risk versus benefit of those discussed.   Repetitive spaced learning was employed today to elicit superior memory formation and behavioral change.  4. Obesity: BMI 38.2 Laura Brown is currently in the action stage of change. As such, her goal is to continue with weight loss efforts. She has agreed to keeping a food journal and adhering to recommended goals of 1200-1400 calories and 80+ grams of protein daily.   Protein ideas were given, and she was encouraged to increase AM protein.  Exercise goals: As is.  Behavioral modification strategies: increasing lean protein intake, meal planning and cooking strategies, and keeping a strict food journal.  Laura Brown has agreed to follow-up with our clinic in 2 to 3 weeks. She was informed of the importance of frequent follow-up visits to maximize her success with intensive lifestyle modifications for her multiple health conditions.   Objective:   Temperature 98.2 F (36.8 C), height 5\' 6"  (1.676 m), weight 236 lb (107  kg), SpO2 100 %. Body mass index is 38.09 kg/m.  General: Cooperative, alert, well developed, in no acute distress. HEENT: Conjunctivae and lids unremarkable. Cardiovascular: Regular rhythm.  Lungs: Normal work of breathing. Neurologic: No focal deficits.   Lab Results  Component Value Date   CREATININE 0.72 07/27/2020    BUN 14 07/27/2020   NA 136 07/27/2020   K 4.0 07/27/2020   CL 103 07/27/2020   CO2 26 07/27/2020   Lab Results  Component Value Date   ALT 10 07/15/2019   AST 13 07/15/2019   ALKPHOS 67 07/15/2019   BILITOT 0.3 07/15/2019   Lab Results  Component Value Date   HGBA1C 4.6 (L) 07/15/2019   HGBA1C 4.6 (L) 03/25/2019   HGBA1C 4.7 (L) 11/26/2018   HGBA1C 4.9 11/07/2017   Lab Results  Component Value Date   INSULIN 8.6 07/15/2019   INSULIN 9.7 03/25/2019   INSULIN 6.1 11/26/2018   INSULIN 6.7 11/07/2017   Lab Results  Component Value Date   TSH 0.984 11/07/2017   Lab Results  Component Value Date   CHOL 165 11/26/2018   HDL 62 11/26/2018   LDLCALC 94 11/26/2018   TRIG 44 11/26/2018   Lab Results  Component Value Date   VD25OH 41.0 07/15/2019   VD25OH 44.6 03/25/2019   VD25OH 35.7 11/26/2018   Lab Results  Component Value Date   WBC 10.2 07/27/2020   HGB 11.0 (L) 07/27/2020   HCT 35.7 (L) 07/27/2020   MCV 95.7 07/27/2020   PLT 244 07/27/2020   Lab Results  Component Value Date   FERRITIN 32 03/22/2011   Attestation Statements:   Reviewed by clinician on day of visit: allergies, medications, problem list, medical history, surgical history, family history, social history, and previous encounter notes.   I, Burt Knack, am acting as transcriptionist for Quillian Quince, MD.  I have reviewed the above documentation for accuracy and completeness, and I agree with the above. -  Quillian Quince, MD

## 2020-11-21 NOTE — Progress Notes (Signed)
  S: Patient presents for review of their specialty medication therapy.  Patient is currently taking Otezla for psoriasis. Patient is managed by Dr. Duanne Guess for this.   Adherence: confirms   Efficacy: endorses occasional flare bur reports that she can definitely tell a difference since starting [place].    Dosing:  Active psoriatic arthritis or plaque psoriasis (moderate to severe): Oral: 30 mg BID  Current adverse effects: Headache: none  GI upset: none  Weight loss: none  Neuropsychiatric effects: none   O:     Lab Results  Component Value Date   WBC 10.2 07/27/2020   HGB 11.0 (L) 07/27/2020   HCT 35.7 (L) 07/27/2020   MCV 95.7 07/27/2020   PLT 244 07/27/2020      Chemistry      Component Value Date/Time   NA 136 07/27/2020 0916   NA 142 07/15/2019 1238   NA 143 03/22/2011 1014   K 4.0 07/27/2020 0916   K 3.9 03/22/2011 1014   CL 103 07/27/2020 0916   CL 107 03/22/2011 1014   CO2 26 07/27/2020 0916   CO2 26 03/22/2011 1014   BUN 14 07/27/2020 0916   BUN 13 07/15/2019 1238   BUN 14 03/22/2011 1014   CREATININE 0.72 07/27/2020 0916   CREATININE 0.73 03/22/2011 1014      Component Value Date/Time   CALCIUM 8.4 (L) 07/27/2020 0916   CALCIUM 8.7 03/22/2011 1014   ALKPHOS 67 07/15/2019 1238   ALKPHOS 54 03/22/2011 1014   AST 13 07/15/2019 1238   AST 21 03/22/2011 1014   ALT 10 07/15/2019 1238   ALT 15 03/22/2011 1014   BILITOT 0.3 07/15/2019 1238   BILITOT 0.5 03/22/2011 1014       A/P: 1. Medication review: patient is currently on Otezla for psoriasis and is tolerating it well. Reviewed the medication including the following: apremilast inhibits phosphodiesterase 4 (PDE4) specific for cyclic adenosine monophosphate (cAMP) which results in increased intracellular cAMP levels and regulation of numerous inflammatory mediators (eg, decreased expression of nitric oxide synthase, TNF-alpha, and interleukin [IL]-23, as well as increased IL-10. Patient educated on  purpose, proper use and potential adverse effects of Otezla. Possible adverse effects include weight loss, GI upset, headache, and mood changes. Renal function should be routinely monitored. Administer without regard to food. Do not crush, chew, or split tablets. No recommendations for any changes at this time.   Butch Penny, PharmD, [place]y [place], CPP Clinical Pharmacist [facility] [phone]

## 2020-11-28 ENCOUNTER — Other Ambulatory Visit (HOSPITAL_BASED_OUTPATIENT_CLINIC_OR_DEPARTMENT_OTHER): Payer: Self-pay

## 2020-12-02 ENCOUNTER — Other Ambulatory Visit: Payer: Self-pay | Admitting: Pharmacist

## 2020-12-02 ENCOUNTER — Other Ambulatory Visit (HOSPITAL_COMMUNITY): Payer: Self-pay

## 2020-12-02 MED ORDER — APREMILAST 30 MG PO TABS
ORAL_TABLET | ORAL | 0 refills | Status: DC
  Filled 2020-12-02: qty 60, fill #0

## 2020-12-02 MED ORDER — OTEZLA 30 MG PO TABS
ORAL_TABLET | ORAL | 0 refills | Status: DC
  Filled 2020-12-02: qty 60, 30d supply, fill #0

## 2020-12-05 ENCOUNTER — Encounter (INDEPENDENT_AMBULATORY_CARE_PROVIDER_SITE_OTHER): Payer: Self-pay | Admitting: Family Medicine

## 2020-12-05 ENCOUNTER — Other Ambulatory Visit (HOSPITAL_BASED_OUTPATIENT_CLINIC_OR_DEPARTMENT_OTHER): Payer: Self-pay

## 2020-12-05 ENCOUNTER — Other Ambulatory Visit (HOSPITAL_COMMUNITY): Payer: Self-pay

## 2020-12-05 DIAGNOSIS — R632 Polyphagia: Secondary | ICD-10-CM

## 2020-12-05 MED ORDER — SAXENDA 18 MG/3ML ~~LOC~~ SOPN
0.6000 mg | PEN_INJECTOR | Freq: Every day | SUBCUTANEOUS | 0 refills | Status: DC
  Filled 2020-12-05: qty 15, 90d supply, fill #0

## 2020-12-05 NOTE — Telephone Encounter (Signed)
I called pharmacy and the Saxenda would need to be sent for 69mL, for 1 box verses 27mL, for 1 pen.

## 2020-12-05 NOTE — Telephone Encounter (Signed)
Bernie Covey should always be written for 3.0, please double check how this was sent in

## 2020-12-05 NOTE — Telephone Encounter (Signed)
Ok, please resend for the full box

## 2020-12-05 NOTE — Telephone Encounter (Signed)
Please see message and advise.  Thank you. ° °

## 2020-12-06 ENCOUNTER — Other Ambulatory Visit (HOSPITAL_COMMUNITY): Payer: Self-pay

## 2020-12-06 ENCOUNTER — Other Ambulatory Visit (HOSPITAL_BASED_OUTPATIENT_CLINIC_OR_DEPARTMENT_OTHER): Payer: Self-pay

## 2020-12-06 MED ORDER — TECHLITE PEN NEEDLES 32G X 6 MM MISC
0 refills | Status: DC
  Filled 2020-12-06: qty 100, 90d supply, fill #0

## 2020-12-13 ENCOUNTER — Ambulatory Visit (INDEPENDENT_AMBULATORY_CARE_PROVIDER_SITE_OTHER): Payer: No Typology Code available for payment source | Admitting: Family Medicine

## 2020-12-20 ENCOUNTER — Other Ambulatory Visit: Payer: Self-pay | Admitting: Internal Medicine

## 2020-12-20 ENCOUNTER — Other Ambulatory Visit (INDEPENDENT_AMBULATORY_CARE_PROVIDER_SITE_OTHER): Payer: Self-pay | Admitting: Family Medicine

## 2020-12-20 ENCOUNTER — Other Ambulatory Visit (HOSPITAL_BASED_OUTPATIENT_CLINIC_OR_DEPARTMENT_OTHER): Payer: Self-pay

## 2020-12-20 DIAGNOSIS — E559 Vitamin D deficiency, unspecified: Secondary | ICD-10-CM

## 2020-12-20 DIAGNOSIS — R632 Polyphagia: Secondary | ICD-10-CM

## 2020-12-20 NOTE — Telephone Encounter (Signed)
prescriber Hyman Hopes, MD not at this practice . Requested Prescriptions  Refused Prescriptions Disp Refills  . Apremilast (OTEZLA) 30 MG TABS 60 tablet 0    Sig: Take 1 tablet orally 2 times per day     Off-Protocol Failed - 12/20/2020 11:22 AM      Failed - Medication not assigned to a protocol, review manually.      Passed - Valid encounter within last 12 months    Recent Outpatient Visits          4 weeks ago Encounter for medication review   [facility] Lo[facility]uxley, Cornelius Moras, RPH-CPP   1 year ago Encounter for medication review   [facility] Lois Huxley, Cornelius Moras, RPH-CPP   6 years ago Primary osteoarthritis of both knees    [facility], Ihor Austin, MD   6 years ago Primary osteoarthritis of both knees   [facility], Ihor Austin, MD

## 2020-12-21 ENCOUNTER — Other Ambulatory Visit (HOSPITAL_BASED_OUTPATIENT_CLINIC_OR_DEPARTMENT_OTHER): Payer: Self-pay

## 2020-12-21 MED ORDER — VERAPAMIL HCL ER 100 MG PO CP24
200.0000 mg | ORAL_CAPSULE | Freq: Every day | ORAL | 3 refills | Status: DC
  Filled 2020-12-21: qty 180, 90d supply, fill #0

## 2020-12-27 ENCOUNTER — Encounter (INDEPENDENT_AMBULATORY_CARE_PROVIDER_SITE_OTHER): Payer: Self-pay | Admitting: Family Medicine

## 2020-12-27 ENCOUNTER — Ambulatory Visit (INDEPENDENT_AMBULATORY_CARE_PROVIDER_SITE_OTHER): Payer: No Typology Code available for payment source | Admitting: Family Medicine

## 2020-12-27 ENCOUNTER — Other Ambulatory Visit (HOSPITAL_COMMUNITY): Payer: Self-pay

## 2020-12-27 ENCOUNTER — Other Ambulatory Visit: Payer: Self-pay

## 2020-12-27 ENCOUNTER — Other Ambulatory Visit (HOSPITAL_BASED_OUTPATIENT_CLINIC_OR_DEPARTMENT_OTHER): Payer: Self-pay

## 2020-12-27 VITALS — BP 104/68 | HR 68 | Temp 98.0°F | Ht 66.0 in | Wt 232.0 lb

## 2020-12-27 DIAGNOSIS — E88819 Insulin resistance, unspecified: Secondary | ICD-10-CM

## 2020-12-27 DIAGNOSIS — E8881 Metabolic syndrome: Secondary | ICD-10-CM | POA: Diagnosis not present

## 2020-12-27 DIAGNOSIS — R632 Polyphagia: Secondary | ICD-10-CM

## 2020-12-27 DIAGNOSIS — E559 Vitamin D deficiency, unspecified: Secondary | ICD-10-CM

## 2020-12-27 DIAGNOSIS — D649 Anemia, unspecified: Secondary | ICD-10-CM | POA: Diagnosis not present

## 2020-12-27 DIAGNOSIS — E7849 Other hyperlipidemia: Secondary | ICD-10-CM

## 2020-12-27 DIAGNOSIS — F3289 Other specified depressive episodes: Secondary | ICD-10-CM

## 2020-12-27 DIAGNOSIS — Z0000 Encounter for general adult medical examination without abnormal findings: Secondary | ICD-10-CM

## 2020-12-27 DIAGNOSIS — Z6841 Body Mass Index (BMI) 40.0 and over, adult: Secondary | ICD-10-CM

## 2020-12-27 MED ORDER — SAXENDA 18 MG/3ML ~~LOC~~ SOPN
0.6000 mg | PEN_INJECTOR | Freq: Every day | SUBCUTANEOUS | 0 refills | Status: DC
  Filled 2020-12-27 – 2021-02-02 (×2): qty 15, 150d supply, fill #0
  Filled 2021-02-02: qty 15, 90d supply, fill #0

## 2020-12-27 NOTE — Progress Notes (Signed)
Chief Complaint:   OBESITY Laura Brown is here to discuss her progress with her obesity treatment plan along with follow-up of her obesity related diagnoses. Laura Brown is on keeping a food journal and adhering to recommended goals of 1200-1400 calories and 80 grams of protein and states she is following her eating plan approximately 50% of the time. Laura Brown states she is walking for 20 minutes 3 times per week.  Today's visit was #: 41 Starting weight: 296 lbs Starting date: 11/07/2017 Today's weight: 232 lbs Today's date: 12/27/2020 Total lbs lost to date: 64 lbs Total lbs lost since last in-office visit: 4 lbs  Interim History: Laura Brown recently returned for follow up after several months hiatus. She was up from about 20 lbs. We put her back on Saxenda last office visit. She has been doing well and is down 4 lbs today.  Subjective:   1. Polyphagia Laura Brown was put on Saxenda at last office visit. She is slowly titrating up on the dose and is currently on 1.8 mg daily. She had been on Saxenda previously at the [facility] dose (3.0 mg)  2. Insulin resistance Laura Brown's last A1C was within normal limits. She is on Saxenda at 1.8 mg dose..  Lab Results  Component Value Date   INSULIN 8.6 07/15/2019   INSULIN 9.7 03/25/2019   INSULIN 6.1 11/26/2018   INSULIN 6.7 11/07/2017   Lab Results  Component Value Date   HGBA1C 4.6 (L) 07/15/2019    3. Hemoglobin low Laura Brown's hemoglobin was low after total knee replacement in June.  4. Vitamin D deficiency Laura Brown is on prescription Vitamin D. Her Vitamin D is low at 41.  Lab Results  Component Value Date   VD25OH 41.0 07/15/2019   VD25OH 44.6 03/25/2019   VD25OH 35.7 11/26/2018    5. Other hyperlipidemia Laura Brown's LDL was elevated at 131 (weight was 296 lbs) back in 2019 but within normal limits in October 2020 (weight 258 lbs). She is not on statin.   Lab Results  Component Value Date   CHOL 165 11/26/2018   HDL 62 11/26/2018   LDLCALC 94  11/26/2018   TRIG 44 11/26/2018   Lab Results  Component Value Date   ALT 10 07/15/2019   AST 13 07/15/2019   ALKPHOS 67 07/15/2019   BILITOT 0.3 07/15/2019   The 10-year ASCVD risk score (Arnett DK, et al., 2019) is: 0.9%   Values used to calculate the score:     Age: 50 years     Sex: Female     Is Non-Hispanic African American: No     Diabetic: No     Tobacco smoker: No     Systolic Blood Pressure: 104 mmHg     Is BP treated: Yes     HDL Cholesterol: 62 mg/dL     Total Cholesterol: 165 mg/dL    6. Other depression, with emotional eating Laura Brown notes she has had some severe sweets cravings recently.   Assessment/Plan:   1. Polyphagia  Laura Brown will continue to titrate dose of Saxenda up as tolerated. - Liraglutide -Weight Management (SAXENDA) 18 MG/3ML SOPN; Inject 0.6 mg into the skin daily with breakfast.  Dispense: 15 mL; Refill: 0  2. Insulin resistance  We will check labs today. She will continue to titrate up dose of Saxenda. Laura Brown agreed to follow-up with [place] as directed to closely monitor her progress.  - Comprehensive metabolic panel - Hemoglobin A1c - Insulin, random  3. Hemoglobin low We will check labs  today.   - CBC with Differential/Platelet  4. Vitamin D deficiency  [name] will continue prescription Vitamin D 50,000 IU every week and she will follow-up for routine testing of Vitamin D, at least 2-3 times per year to avoid over-replacement. We will check labs today.  - VITAMIN D 25 Hydroxy (Vit-D Deficiency, Fractures)  5. Other hyperlipidemia  We discussed several lifestyle modifications today and Laura Brown will continue to work on diet, exercise and weight loss efforts. We will check FLP today.   - Lipid Panel With LDL/HDL Ratio  6. Healthcare maintenance  - T4, free - TSH - T3  7. Other depression, with emotional eating  Laura Brown agrees to increase dose bupropion 200 mg twice daily. She does not need a refill today.   8. Obesity: BMI  38.2 Laura Brown is currently in the action stage of change. As such, her goal is to continue with weight loss efforts. She has agreed to keeping a food journal and adhering to recommended goals of 1200-1400 calories and 80 grams of protein daily.  Exercise goals:  As is.  Behavioral modification strategies: increasing lean protein intake, decreasing simple carbohydrates, and planning for success.  Laura Brown has agreed to follow-up with our clinic in 3-5 weeks.  Objective:   Blood pressure 104/68, pulse 68, temperature 98 F (36.7 C), height 5\' 6"  (1.676 m), weight 232 lb (105.2 kg), SpO2 100 %. Body mass index is 37.45 kg/m.  General: Cooperative, alert, well developed, in no acute distress. HEENT: Conjunctivae and lids unremarkable. Cardiovascular: Regular rhythm.  Lungs: Normal work of breathing. Neurologic: No focal deficits.   Lab Results  Component Value Date   CREATININE 0.72 07/27/2020   BUN 14 07/27/2020   NA 136 07/27/2020   K 4.0 07/27/2020   CL 103 07/27/2020   CO2 26 07/27/2020   Lab Results  Component Value Date   ALT 10 07/15/2019   AST 13 07/15/2019   ALKPHOS 67 07/15/2019   BILITOT 0.3 07/15/2019   Lab Results  Component Value Date   HGBA1C 4.6 (L) 07/15/2019   HGBA1C 4.6 (L) 03/25/2019   HGBA1C 4.7 (L) 11/26/2018   HGBA1C 4.9 11/07/2017   Lab Results  Component Value Date   INSULIN 8.6 07/15/2019   INSULIN 9.7 03/25/2019   INSULIN 6.1 11/26/2018   INSULIN 6.7 11/07/2017   Lab Results  Component Value Date   TSH 0.984 11/07/2017   Lab Results  Component Value Date   CHOL 165 11/26/2018   HDL 62 11/26/2018   LDLCALC 94 11/26/2018   TRIG 44 11/26/2018   Lab Results  Component Value Date   VD25OH 41.0 07/15/2019   VD25OH 44.6 03/25/2019   VD25OH 35.7 11/26/2018   Lab Results  Component Value Date   WBC 10.2 07/27/2020   HGB 11.0 (L) 07/27/2020   HCT 35.7 (L) 07/27/2020   MCV 95.7 07/27/2020   PLT 244 07/27/2020   Lab Results   Component Value Date   FERRITIN 32 03/22/2011   Attestation Statements:   Reviewed by clinician on day of visit: allergies, medications, problem list, medical history, surgical history, family history, social history, and previous encounter notes.  I, Lizbeth Bark, RMA, am acting as Location manager for [employer], [place].   I have reviewed the above documentation for accuracy and completeness, and I agree with the above. -  Georgianne Fick, FNP

## 2020-12-28 ENCOUNTER — Other Ambulatory Visit: Payer: Self-pay

## 2020-12-28 ENCOUNTER — Other Ambulatory Visit (HOSPITAL_COMMUNITY): Payer: Self-pay

## 2020-12-28 LAB — COMPREHENSIVE METABOLIC PANEL
ALT: 6 IU/L (ref 0–32)
AST: 13 IU/L (ref 0–40)
Albumin/Globulin Ratio: 1.4 (ref 1.2–2.2)
Albumin: 4.1 g/dL (ref 3.8–4.9)
Alkaline Phosphatase: 78 IU/L (ref 44–121)
BUN/Creatinine Ratio: 14 (ref 9–23)
BUN: 11 mg/dL (ref 6–24)
Bilirubin Total: 0.2 mg/dL (ref 0.0–1.2)
CO2: 25 mmol/L (ref 20–29)
Calcium: 9.1 mg/dL (ref 8.7–10.2)
Chloride: 103 mmol/L (ref 96–106)
Creatinine, Ser: 0.76 mg/dL (ref 0.57–1.00)
Globulin, Total: 2.9 g/dL (ref 1.5–4.5)
Glucose: 73 mg/dL (ref 70–99)
Potassium: 4.9 mmol/L (ref 3.5–5.2)
Sodium: 141 mmol/L (ref 134–144)
Total Protein: 7 g/dL (ref 6.0–8.5)
eGFR: 94 mL/min/{1.73_m2} (ref >59)

## 2020-12-28 LAB — LIPID PANEL WITH LDL/HDL RATIO
Cholesterol, Total: 197 mg/dL (ref 100–199)
HDL: 75 mg/dL (ref >39)
LDL Chol Calc (NIH): 111 mg/dL — ABNORMAL HIGH (ref 0–99)
LDL/HDL Ratio: 1.5 ratio (ref 0.0–3.2)
Triglycerides: 57 mg/dL (ref 0–149)
VLDL Cholesterol Cal: 11 mg/dL (ref 5–40)

## 2020-12-28 LAB — T4, FREE: Free T4: 0.92 ng/dL (ref 0.82–1.77)

## 2020-12-28 LAB — CBC WITH DIFFERENTIAL/PLATELET
Basophils Absolute: 0 10*3/uL (ref 0.0–0.2)
Basos: 1 %
EOS (ABSOLUTE): 0.1 10*3/uL (ref 0.0–0.4)
Eos: 2 %
Hematocrit: 33.5 % — ABNORMAL LOW (ref 34.0–46.6)
Hemoglobin: 10.4 g/dL — ABNORMAL LOW (ref 11.1–15.9)
Immature Grans (Abs): 0 10*3/uL (ref 0.0–0.1)
Immature Granulocytes: 0 %
Lymphocytes Absolute: 1.4 10*3/uL (ref 0.7–3.1)
Lymphs: 32 %
MCH: 24.8 pg — ABNORMAL LOW (ref 26.6–33.0)
MCHC: 31 g/dL — ABNORMAL LOW (ref 31.5–35.7)
MCV: 80 fL (ref 79–97)
Monocytes Absolute: 0.3 10*3/uL (ref 0.1–0.9)
Monocytes: 7 %
Neutrophils Absolute: 2.5 10*3/uL (ref 1.4–7.0)
Neutrophils: 58 %
Platelets: 336 10*3/uL (ref 150–450)
RBC: 4.19 x10E6/uL (ref 3.77–5.28)
RDW: 13.5 % (ref 11.7–15.4)
WBC: 4.2 10*3/uL (ref 3.4–10.8)

## 2020-12-28 LAB — INSULIN, RANDOM: INSULIN: 3 u[IU]/mL (ref 2.6–24.9)

## 2020-12-28 LAB — TSH: TSH: 1.34 u[IU]/mL (ref 0.450–4.500)

## 2020-12-28 LAB — VITAMIN D 25 HYDROXY (VIT D DEFICIENCY, FRACTURES): Vit D, 25-Hydroxy: 49.3 ng/mL (ref 30.0–100.0)

## 2020-12-28 LAB — HEMOGLOBIN A1C
Est. average glucose Bld gHb Est-mCnc: 103 mg/dL
Hgb A1c MFr Bld: 5.2 % (ref 4.8–5.6)

## 2020-12-28 LAB — T3: T3, Total: 138 ng/dL (ref 71–180)

## 2021-01-01 ENCOUNTER — Other Ambulatory Visit (HOSPITAL_COMMUNITY): Payer: Self-pay

## 2021-01-02 ENCOUNTER — Other Ambulatory Visit (HOSPITAL_COMMUNITY): Payer: Self-pay

## 2021-01-02 ENCOUNTER — Other Ambulatory Visit: Payer: Self-pay | Admitting: Pharmacist

## 2021-01-02 MED ORDER — OTEZLA 30 MG PO TABS
ORAL_TABLET | ORAL | 3 refills | Status: DC
  Filled 2021-01-02: qty 60, 30d supply, fill #0

## 2021-01-02 MED ORDER — APREMILAST 30 MG PO TABS
ORAL_TABLET | ORAL | 3 refills | Status: DC
  Filled 2021-01-02: qty 60, fill #0

## 2021-01-06 LAB — CBC AND DIFFERENTIAL
HCT: 37 (ref 36–46)
Hemoglobin: 11.5 — AB (ref 12.0–16.0)
Platelets: 392 10*3/uL (ref 150–400)
WBC: 6.9

## 2021-01-06 LAB — BASIC METABOLIC PANEL
BUN: 13 (ref 4–21)
CO2: 26 — AB (ref 13–22)
Chloride: 100 (ref 99–108)
Creatinine: 0.8 (ref 0.5–1.1)
Glucose: 80
Potassium: 5 mEq/L (ref 3.5–5.1)
Sodium: 138 (ref 137–147)

## 2021-01-06 LAB — TSH: TSH: 0.94 (ref 0.41–5.90)

## 2021-01-06 LAB — HEPATIC FUNCTION PANEL
ALT: 6 U/L — AB (ref 7–35)
AST: 14 (ref 13–35)
Alkaline Phosphatase: 81 (ref 25–125)
Bilirubin, Total: 0.2

## 2021-01-06 LAB — VITAMIN B12: Vitamin B-12: 243

## 2021-01-06 LAB — LIPID PANEL
Cholesterol: 202 — AB (ref 0–200)
HDL: 82 — AB (ref 35–70)
LDL Cholesterol: 109
LDl/HDL Ratio: 1.3
Triglycerides: 57 (ref 40–160)

## 2021-01-06 LAB — IRON,TIBC AND FERRITIN PANEL: Ferritin: 6.5

## 2021-01-06 LAB — CBC: RBC: 4.64 (ref 3.87–5.11)

## 2021-01-06 LAB — COMPREHENSIVE METABOLIC PANEL
Albumin: 4.5 (ref 3.5–5.0)
Calcium: 9.6 (ref 8.7–10.7)
eGFR: 90

## 2021-01-06 LAB — HM HIV SCREENING LAB: HM HIV Screening: NEGATIVE

## 2021-01-12 ENCOUNTER — Other Ambulatory Visit: Payer: Self-pay | Admitting: Family Medicine

## 2021-01-12 DIAGNOSIS — Z1231 Encounter for screening mammogram for malignant neoplasm of breast: Secondary | ICD-10-CM

## 2021-01-17 ENCOUNTER — Other Ambulatory Visit (HOSPITAL_COMMUNITY): Payer: Self-pay

## 2021-01-17 ENCOUNTER — Other Ambulatory Visit (HOSPITAL_BASED_OUTPATIENT_CLINIC_OR_DEPARTMENT_OTHER): Payer: Self-pay

## 2021-01-17 ENCOUNTER — Other Ambulatory Visit: Payer: Self-pay | Admitting: Pharmacist

## 2021-01-17 MED ORDER — VERAPAMIL HCL ER 100 MG PO CP24
200.0000 mg | ORAL_CAPSULE | Freq: Every day | ORAL | 3 refills | Status: DC
  Filled 2021-01-17 – 2021-03-27 (×3): qty 180, 90d supply, fill #0
  Filled 2021-07-10 – 2021-07-11 (×3): qty 180, 90d supply, fill #1

## 2021-01-17 MED ORDER — OTEZLA 30 MG PO TABS
ORAL_TABLET | ORAL | 11 refills | Status: DC
  Filled 2021-01-17: qty 60, fill #0
  Filled 2021-01-18: qty 60, 30d supply, fill #0
  Filled 2021-02-15: qty 60, 30d supply, fill #1
  Filled 2021-03-16: qty 60, 30d supply, fill #2
  Filled 2021-04-17: qty 60, 30d supply, fill #3
  Filled 2021-05-11: qty 60, 30d supply, fill #4
  Filled 2021-06-01: qty 60, 30d supply, fill #5
  Filled 2021-07-04: qty 60, 30d supply, fill #6
  Filled 2021-08-07: qty 60, 30d supply, fill #7
  Filled 2021-09-01: qty 60, 30d supply, fill #8
  Filled 2021-10-03: qty 60, 30d supply, fill #9
  Filled 2021-10-30: qty 60, 30d supply, fill #10
  Filled 2021-11-30: qty 60, 30d supply, fill #11

## 2021-01-17 MED ORDER — BUPROPION HCL ER (SR) 200 MG PO TB12
ORAL_TABLET | ORAL | 3 refills | Status: DC
Start: 2021-01-17 — End: 2021-03-08
  Filled 2021-01-17: qty 180, 90d supply, fill #0

## 2021-01-17 MED ORDER — DULOXETINE HCL 60 MG PO CPEP
120.0000 mg | ORAL_CAPSULE | Freq: Every day | ORAL | 3 refills | Status: AC
  Filled 2021-01-17 – 2021-02-13 (×2): qty 180, 90d supply, fill #0
  Filled 2021-06-08: qty 180, 90d supply, fill #1
  Filled 2021-09-26: qty 180, 90d supply, fill #2

## 2021-01-17 MED ORDER — OTEZLA 30 MG PO TABS
ORAL_TABLET | ORAL | 11 refills | Status: DC
Start: 2021-01-17 — End: 2021-01-17
  Filled 2021-01-17: qty 60, 30d supply, fill #0

## 2021-01-18 ENCOUNTER — Other Ambulatory Visit: Payer: Self-pay

## 2021-01-18 ENCOUNTER — Other Ambulatory Visit (HOSPITAL_COMMUNITY): Payer: Self-pay

## 2021-01-18 ENCOUNTER — Ambulatory Visit (INDEPENDENT_AMBULATORY_CARE_PROVIDER_SITE_OTHER): Payer: No Typology Code available for payment source

## 2021-01-18 DIAGNOSIS — Z1231 Encounter for screening mammogram for malignant neoplasm of breast: Secondary | ICD-10-CM | POA: Diagnosis not present

## 2021-01-26 ENCOUNTER — Other Ambulatory Visit (HOSPITAL_COMMUNITY): Payer: Self-pay

## 2021-01-26 ENCOUNTER — Other Ambulatory Visit (HOSPITAL_BASED_OUTPATIENT_CLINIC_OR_DEPARTMENT_OTHER): Payer: Self-pay

## 2021-02-01 ENCOUNTER — Ambulatory Visit (INDEPENDENT_AMBULATORY_CARE_PROVIDER_SITE_OTHER): Payer: No Typology Code available for payment source | Admitting: Family Medicine

## 2021-02-02 ENCOUNTER — Other Ambulatory Visit (HOSPITAL_BASED_OUTPATIENT_CLINIC_OR_DEPARTMENT_OTHER): Payer: Self-pay

## 2021-02-03 ENCOUNTER — Other Ambulatory Visit (HOSPITAL_BASED_OUTPATIENT_CLINIC_OR_DEPARTMENT_OTHER): Payer: Self-pay

## 2021-02-06 ENCOUNTER — Encounter (INDEPENDENT_AMBULATORY_CARE_PROVIDER_SITE_OTHER): Payer: Self-pay | Admitting: Family Medicine

## 2021-02-06 ENCOUNTER — Other Ambulatory Visit (HOSPITAL_BASED_OUTPATIENT_CLINIC_OR_DEPARTMENT_OTHER): Payer: Self-pay

## 2021-02-06 ENCOUNTER — Other Ambulatory Visit (INDEPENDENT_AMBULATORY_CARE_PROVIDER_SITE_OTHER): Payer: Self-pay | Admitting: Family Medicine

## 2021-02-06 DIAGNOSIS — R632 Polyphagia: Secondary | ICD-10-CM

## 2021-02-06 MED ORDER — SAXENDA 18 MG/3ML ~~LOC~~ SOPN
3.0000 mg | PEN_INJECTOR | Freq: Every day | SUBCUTANEOUS | 0 refills | Status: DC
  Filled 2021-02-06 (×2): qty 15, 30d supply, fill #0

## 2021-02-06 NOTE — Telephone Encounter (Signed)
Please advise 

## 2021-02-06 NOTE — Telephone Encounter (Signed)
LAST APPOINTMENT DATE: 12/27/20 NEXT APPOINTMENT DATE: 02/08/21   [facility] [address] [place] [address] Phone: [phone] Fax: [phone]  [facility] [address] [place] [address] Phone: [phone] Fax: [phone]  Patient is requesting a refill of the following medications: Requested Prescriptions   Pending Prescriptions Disp Refills   Liraglutide -Weight Management (SAXENDA) 18 MG/3ML SOPN 15 mL 0    Sig: Inject 0.6 mg into the skin daily with breakfast.    Date last filled: 12/27/20  Previously prescribed by [facility]  Lab Results  Component Value Date   HGBA1C 5.2 12/27/2020   HGBA1C 4.6 (L) 07/15/2019   HGBA1C 4.6 (L) 03/25/2019   Lab Results  Component Value Date   LDLCALC 111 (H) 12/27/2020   CREATININE 0.76 12/27/2020   Lab Results  Component Value Date   VD25OH 49.3 12/27/2020   VD25OH 41.0 07/15/2019   VD25OH 44.6 03/25/2019    BP Readings from Last 3 Encounters:  12/27/20 104/68  07/27/20 (!) 95/49  07/13/20 139/70

## 2021-02-08 ENCOUNTER — Ambulatory Visit (INDEPENDENT_AMBULATORY_CARE_PROVIDER_SITE_OTHER): Payer: No Typology Code available for payment source | Admitting: Family Medicine

## 2021-02-08 ENCOUNTER — Encounter (INDEPENDENT_AMBULATORY_CARE_PROVIDER_SITE_OTHER): Payer: Self-pay | Admitting: Family Medicine

## 2021-02-08 ENCOUNTER — Other Ambulatory Visit (HOSPITAL_BASED_OUTPATIENT_CLINIC_OR_DEPARTMENT_OTHER): Payer: Self-pay

## 2021-02-08 ENCOUNTER — Other Ambulatory Visit: Payer: Self-pay

## 2021-02-08 VITALS — BP 107/69 | HR 70 | Temp 98.0°F | Ht 66.0 in | Wt 228.0 lb

## 2021-02-08 DIAGNOSIS — D509 Iron deficiency anemia, unspecified: Secondary | ICD-10-CM | POA: Diagnosis not present

## 2021-02-08 DIAGNOSIS — F3289 Other specified depressive episodes: Secondary | ICD-10-CM

## 2021-02-08 DIAGNOSIS — E559 Vitamin D deficiency, unspecified: Secondary | ICD-10-CM

## 2021-02-08 DIAGNOSIS — Z6841 Body Mass Index (BMI) 40.0 and over, adult: Secondary | ICD-10-CM

## 2021-02-08 DIAGNOSIS — Z6836 Body mass index (BMI) 36.0-36.9, adult: Secondary | ICD-10-CM

## 2021-02-08 MED ORDER — VITAMIN D (ERGOCALCIFEROL) 1.25 MG (50000 UNIT) PO CAPS
ORAL_CAPSULE | ORAL | 0 refills | Status: DC
  Filled 2021-02-08: qty 12, 84d supply, fill #0

## 2021-02-08 NOTE — Progress Notes (Signed)
Chief Complaint:   OBESITY Laura Brown is here to discuss her progress with her obesity treatment plan along with follow-up of her obesity related diagnoses. Laura Brown is on keeping a food journal and adhering to recommended goals of 1200-1400 calories and 80 grams of protein and states she is following her eating plan approximately 75% of the time. Laura Brown states she is walking for 20 minutes 2 times per week.  Today's visit was #: 70 Starting weight: 296 lbs Starting date: 11/07/2017 Today's weight: 228 lbs Today's date: 02/08/2021 Total lbs lost to date: 68 lbs Total lbs lost since last in-office visit: 4 lbs  Interim History: Laura Brown journals about 4 days per week. She notes appetite is well controlled with Saxenda. She is on 3 mg.  Subjective:   1. Vitamin D deficiency Laura Brown's Vitamin D is at goal. She recently stopped Vitamin D prescription because her primary care physician felt it may be causing anemia. She notes feeling severe fatigue since stopping prescription Vitamin D.   Lab Results  Component Value Date   VD25OH 49.3 12/27/2020   VD25OH 41.0 07/15/2019   VD25OH 44.6 03/25/2019    2. Iron deficiency anemia, unspecified iron deficiency anemia type Hgb has decreased gradually over last 3 years from 14.8 to 10.4. This is likely related to history of sleeve gastrectomy and reduced food intake overall (therefore reduced iron intake) since starting at our clinic. Her colonoscopy is up to date. She is post-menopausal. She has not been on an MVI with iron. Her primary care physician started her on iron.  CBC Latest Ref Rng & Units 12/27/2020 07/27/2020 07/13/2020  WBC 3.4 - 10.8 x10E3/uL 4.2 10.2 7.8  Hemoglobin 11.1 - 15.9 g/dL 10.4(L) 11.0(L) 12.3  Hematocrit 34.0 - 46.6 % 33.5(L) 35.7(L) 39.3  Platelets 150 - 450 x10E3/uL 336 244 318   Lab Results  Component Value Date   FERRITIN 32 03/22/2011   Lab Results  Component Value Date   VITAMINB12 493 11/07/2017     3.  Other depression, with emotional eating Laura Brown is taking one am dose of bupropion 200 mg. She notes increased snacking at night.  Assessment/Plan:   1. Vitamin D deficiency We will refill prescription Vitamin D 50,000 IU weekly and OTC Vitamin D 2000 IU daily and she will follow-up for routine testing of Vitamin D, at least 2-3 times per year to avoid over-replacement.  - Vitamin D, Ergocalciferol, (DRISDOL) 1.25 MG (50000 UNIT) CAPS capsule; TAKE 1 CAPSULE (50,000 UNITS TOTAL) BY MOUTH EVERY 7 (SEVEN) DAYS.  Dispense: 12 capsule; Refill: 0  2. Iron deficiency anemia, unspecified iron deficiency anemia type Laura Brown will continue iron daily. We will recheck CBC in 3 months.    3. Other depression, with emotional eating Laura Brown will add another dose of bupropion 200 mg in early afternoon.  4. Obesity: BMI 36.82 Laura Brown is currently in the action stage of change. As such, her goal is to continue with weight loss efforts. She has agreed to keeping a food journal and adhering to recommended goals of 1200-1400 calories and 80 grams of protein daily.   Laura Brown will try to journal 5 days per week.  Exercise goals:  As is.  Behavioral modification strategies: keeping a strict food journal.  Laura Brown has agreed to follow-up with our clinic in 4 weeks.  Objective:   Blood pressure 107/69, pulse 70, temperature 98 F (36.7 C), height 5\' 6"  (1.676 m), weight 228 lb (103.4 kg), SpO2 99 %. Body mass index is  36.8 kg/m.  General: Cooperative, alert, well developed, in no acute distress. HEENT: Conjunctivae and lids unremarkable. Cardiovascular: Regular rhythm.  Lungs: Normal work of breathing. Neurologic: No focal deficits.   Lab Results  Component Value Date   CREATININE 0.76 12/27/2020   BUN 11 12/27/2020   NA 141 12/27/2020   K 4.9 12/27/2020   CL 103 12/27/2020   CO2 25 12/27/2020   Lab Results  Component Value Date   ALT 6 12/27/2020   AST 13 12/27/2020   ALKPHOS 78 12/27/2020    BILITOT 0.2 12/27/2020   Lab Results  Component Value Date   HGBA1C 5.2 12/27/2020   HGBA1C 4.6 (L) 07/15/2019   HGBA1C 4.6 (L) 03/25/2019   HGBA1C 4.7 (L) 11/26/2018   HGBA1C 4.9 11/07/2017   Lab Results  Component Value Date   INSULIN 3.0 12/27/2020   INSULIN 8.6 07/15/2019   INSULIN 9.7 03/25/2019   INSULIN 6.1 11/26/2018   INSULIN 6.7 11/07/2017   Lab Results  Component Value Date   TSH 1.340 12/27/2020   Lab Results  Component Value Date   CHOL 197 12/27/2020   HDL 75 12/27/2020   LDLCALC 111 (H) 12/27/2020   TRIG 57 12/27/2020   Lab Results  Component Value Date   VD25OH 49.3 12/27/2020   VD25OH 41.0 07/15/2019   VD25OH 44.6 03/25/2019   Lab Results  Component Value Date   WBC 4.2 12/27/2020   HGB 10.4 (L) 12/27/2020   HCT 33.5 (L) 12/27/2020   MCV 80 12/27/2020   PLT 336 12/27/2020   Lab Results  Component Value Date   FERRITIN 32 03/22/2011   Attestation Statements:   Reviewed by clinician on day of visit: allergies, medications, problem list, medical history, surgical history, family history, social history, and previous encounter notes.  I, Lizbeth Bark, RMA, am acting as Location manager for [employer], [place].  I have reviewed the above documentation for accuracy and completeness, and I agree with the above. -  Georgianne Fick, FNP

## 2021-02-08 NOTE — Progress Notes (Signed)
The 10-year ASCVD risk score (Arnett DK, et al., 2019) is: 1%   Values used to calculate the score:     Age: 51 years     Sex: Female     Is Non-Hispanic African American: No     Diabetic: No     Tobacco smoker: No     Systolic Blood Pressure: [id number] mmHg     Is BP treated: Yes     HDL Cholesterol: 75 mg/dL     Total Cholesterol: 197 mg/dL

## 2021-02-13 ENCOUNTER — Other Ambulatory Visit (HOSPITAL_BASED_OUTPATIENT_CLINIC_OR_DEPARTMENT_OTHER): Payer: Self-pay

## 2021-02-15 ENCOUNTER — Other Ambulatory Visit (HOSPITAL_COMMUNITY): Payer: Self-pay

## 2021-02-22 ENCOUNTER — Other Ambulatory Visit (HOSPITAL_COMMUNITY): Payer: Self-pay

## 2021-03-02 ENCOUNTER — Other Ambulatory Visit (HOSPITAL_BASED_OUTPATIENT_CLINIC_OR_DEPARTMENT_OTHER): Payer: Self-pay

## 2021-03-02 MED ORDER — CARESTART COVID-19 HOME TEST VI KIT
PACK | 0 refills | Status: AC
  Filled 2021-03-02: qty 2, 4d supply, fill #0

## 2021-03-08 ENCOUNTER — Other Ambulatory Visit (HOSPITAL_BASED_OUTPATIENT_CLINIC_OR_DEPARTMENT_OTHER): Payer: Self-pay

## 2021-03-08 ENCOUNTER — Encounter (INDEPENDENT_AMBULATORY_CARE_PROVIDER_SITE_OTHER): Payer: Self-pay | Admitting: Family Medicine

## 2021-03-08 ENCOUNTER — Telehealth (INDEPENDENT_AMBULATORY_CARE_PROVIDER_SITE_OTHER): Payer: Self-pay

## 2021-03-08 ENCOUNTER — Ambulatory Visit (INDEPENDENT_AMBULATORY_CARE_PROVIDER_SITE_OTHER): Payer: No Typology Code available for payment source | Admitting: Family Medicine

## 2021-03-08 ENCOUNTER — Other Ambulatory Visit: Payer: Self-pay

## 2021-03-08 VITALS — BP 105/57 | HR 76 | Temp 97.5°F | Ht 66.0 in | Wt 223.0 lb

## 2021-03-08 DIAGNOSIS — F3289 Other specified depressive episodes: Secondary | ICD-10-CM

## 2021-03-08 DIAGNOSIS — Z6836 Body mass index (BMI) 36.0-36.9, adult: Secondary | ICD-10-CM

## 2021-03-08 DIAGNOSIS — R632 Polyphagia: Secondary | ICD-10-CM | POA: Diagnosis not present

## 2021-03-08 DIAGNOSIS — E669 Obesity, unspecified: Secondary | ICD-10-CM

## 2021-03-08 DIAGNOSIS — Z6841 Body Mass Index (BMI) 40.0 and over, adult: Secondary | ICD-10-CM

## 2021-03-08 MED ORDER — ONDANSETRON HCL 8 MG PO TABS
8.0000 mg | ORAL_TABLET | Freq: Three times a day (TID) | ORAL | 0 refills | Status: DC | PRN
  Filled 2021-03-08: qty 20, 7d supply, fill #0

## 2021-03-08 MED ORDER — BUPROPION HCL ER (SR) 200 MG PO TB12: 200.0000 mg | ORAL_TABLET | Freq: Every day | ORAL | 0 refills | Status: DC

## 2021-03-08 MED ORDER — WEGOVY 1.7 MG/0.75ML ~~LOC~~ SOAJ
1.7000 mg | SUBCUTANEOUS | 0 refills | Status: DC
Start: 2021-03-08 — End: 2021-04-05
  Filled 2021-03-08: qty 3, 30d supply, fill #0

## 2021-03-08 NOTE — Telephone Encounter (Signed)
Prior auth submitted via cover my meds for Wegovy:   [employer] is reviewing your PA request. You may close this dialog, return to your dashboard, and perform other tasks.  To check for an update later, open this request again from your dashboard. If [employer] has not replied within 24 hours for urgent requests or within 48 hours for standard requests, please contact [employer] at [phone].

## 2021-03-09 ENCOUNTER — Other Ambulatory Visit (HOSPITAL_BASED_OUTPATIENT_CLINIC_OR_DEPARTMENT_OTHER): Payer: Self-pay

## 2021-03-09 ENCOUNTER — Encounter (INDEPENDENT_AMBULATORY_CARE_PROVIDER_SITE_OTHER): Payer: Self-pay | Admitting: Family Medicine

## 2021-03-09 NOTE — Progress Notes (Signed)
Chief Complaint:   OBESITY Laura Brown is here to discuss her progress with her obesity treatment plan along with follow-up of her obesity related diagnoses. Laura Brown is on keeping a food journal and adhering to recommended goals of 1200-1400 calories and 80 grams of protein and states she is following her eating plan approximately 75% of the time. Laura Brown states she is doing 0 minutes 0 times per week.  Today's visit was #: 37 Starting weight: 296 lbs Starting date: 11/07/2017 Today's weight: 223 lbs Today's date:03/08/2021 Total lbs lost to date: 73 lbs Total lbs lost since last in-office visit: 5 lbs  Interim History: Laura Brown feels she is getting in adequate protein. She and her daughter cook dinner and have meat and vegetables in the evening. She is working on Designer, fashion/clothing intake.   Subjective:   1. Polyphagia Laura Brown is on Saxenda 3 mg. She feels it wears off later in the day and she is hungrier in the evening.  2. Other depression, with emotional eating Laura Brown does not feel the added dose of bupropion in the evening helped with hunger or cravings.  Assessment/Plan:   1. Polyphagia  Laura Brown will discontinue Saxenda. Laura Brown agrees to start new prescription of Wegovy 1.7 mg weekly with no refills. She also agrees to start Zofran 8 mg every 8 hours as needed for nausea with no refills.  - Semaglutide-Weight Management (WEGOVY) 1.7 MG/0.75ML SOAJ; Inject 1.7 mg into the skin once a week.  Dispense: 3 mL; Refill: 0 - ondansetron (ZOFRAN) 8 MG tablet; Take 1 tablet (8 mg total) by mouth every 8 (eight) hours as needed for nausea or vomiting.  Dispense: 20 tablet; Refill: 0  2. Other depression, with emotional eating Laura Brown will decrease bupropion dose 200 mg in A.M only.   - buPROPion (WELLBUTRIN SR) 200 MG 12 hr tablet; Take 1 tablet (200 mg total) by mouth daily with breakfast.  Dispense: 90 tablet; Refill: 0  3. Obesity: BMI 36.01 Laura Brown is currently in the action stage of  change. As such, her goal is to continue with weight loss efforts. She has agreed to keeping a food journal and adhering to recommended goals of 1200-1400 calories and 80 grams of protein daily.   Exercise goals: No exercise has been prescribed at this time.  Behavioral modification strategies: increasing water intake.  Laura Brown has agreed to follow-up with our clinic in 4 weeks (virtual).  Objective:   Blood pressure (!) 105/57, pulse 76, temperature (!) 97.5 F (36.4 C), height 5\' 6"  (1.676 m), weight 223 lb (101.2 kg), SpO2 100 %. Body mass index is 35.99 kg/m.  General: Cooperative, alert, well developed, in no acute distress. HEENT: Conjunctivae and lids unremarkable. Cardiovascular: Regular rhythm.  Lungs: Normal work of breathing. Neurologic: No focal deficits.   Lab Results  Component Value Date   CREATININE 0.76 12/27/2020   BUN 11 12/27/2020   NA 141 12/27/2020   K 4.9 12/27/2020   CL 103 12/27/2020   CO2 25 12/27/2020   Lab Results  Component Value Date   ALT 6 12/27/2020   AST 13 12/27/2020   ALKPHOS 78 12/27/2020   BILITOT 0.2 12/27/2020   Lab Results  Component Value Date   HGBA1C 5.2 12/27/2020   HGBA1C 4.6 (L) 07/15/2019   HGBA1C 4.6 (L) 03/25/2019   HGBA1C 4.7 (L) 11/26/2018   HGBA1C 4.9 11/07/2017   Lab Results  Component Value Date   INSULIN 3.0 12/27/2020   INSULIN 8.6 07/15/2019   INSULIN 9.7  03/25/2019   INSULIN 6.1 11/26/2018   INSULIN 6.7 11/07/2017   Lab Results  Component Value Date   TSH 1.340 12/27/2020   Lab Results  Component Value Date   CHOL 197 12/27/2020   HDL 75 12/27/2020   LDLCALC 111 (H) 12/27/2020   TRIG 57 12/27/2020   Lab Results  Component Value Date   VD25OH 49.3 12/27/2020   VD25OH 41.0 07/15/2019   VD25OH 44.6 03/25/2019   Lab Results  Component Value Date   WBC 4.2 12/27/2020   HGB 10.4 (L) 12/27/2020   HCT 33.5 (L) 12/27/2020   MCV 80 12/27/2020   PLT 336 12/27/2020   Lab Results  Component  Value Date   FERRITIN 32 03/22/2011   Attestation Statements:   Reviewed by clinician on day of visit: allergies, medications, problem list, medical history, surgical history, family history, social history, and previous encounter notes.   I, Lizbeth Bark, RMA, am acting as Location manager for [employer], [place].  I have reviewed the above documentation for accuracy and completeness, and I agree with the above. -  Georgianne Fick, FNP

## 2021-03-10 ENCOUNTER — Other Ambulatory Visit (HOSPITAL_BASED_OUTPATIENT_CLINIC_OR_DEPARTMENT_OTHER): Payer: Self-pay

## 2021-03-13 NOTE — Telephone Encounter (Signed)
Message from plan: The request has been approved. The authorization is effective for a maximum of 3 fills from 03/09/2021 to 06/05/2021, as long as the member is enrolled in their current health plan. The request was approved as submitted. This request has been approved for 4mL per 28 days.Additional prior authorizations (PA) have been entered [id number] 0.25mg /0.85mL for a maximum of 3 fills with a quantity limit of 4 pens (33mL) per 28 days ([id number])Wegovy 0.5mg /0.53mL for a maximum of 3 fills with a quantity limit of 4 pens (66mL) per 28 days (PA [id number])Wegovy 1mg /0.28mL for a maximum of 3 fills with a quantity limit of 4 pens (40mL) per 28 days (PA [id number])Wegovy 2.4mg /0.8mL for a maximum of 3 fills with a quantity limit of 4 pens (46mL) per 28 days (PA [id number])effective 03/09/2021 through 06/05/2021. A written notification letter will follow with additional details.

## 2021-03-14 ENCOUNTER — Other Ambulatory Visit (HOSPITAL_COMMUNITY): Payer: Self-pay

## 2021-03-16 ENCOUNTER — Other Ambulatory Visit (HOSPITAL_COMMUNITY): Payer: Self-pay

## 2021-03-22 ENCOUNTER — Other Ambulatory Visit (HOSPITAL_COMMUNITY): Payer: Self-pay

## 2021-03-24 ENCOUNTER — Other Ambulatory Visit (HOSPITAL_BASED_OUTPATIENT_CLINIC_OR_DEPARTMENT_OTHER): Payer: Self-pay

## 2021-03-24 MED ORDER — AZELASTINE HCL 0.1 % NA SOLN
NASAL | 11 refills | Status: DC
  Filled 2021-03-24: qty 30, 25d supply, fill #0
  Filled 2021-07-26: qty 30, 25d supply, fill #1
  Filled 2022-01-12: qty 30, 25d supply, fill #2

## 2021-03-27 ENCOUNTER — Other Ambulatory Visit (HOSPITAL_BASED_OUTPATIENT_CLINIC_OR_DEPARTMENT_OTHER): Payer: Self-pay

## 2021-04-04 LAB — HEPATIC FUNCTION PANEL
ALT: 7 U/L (ref 7–35)
AST: 13 (ref 13–35)
Alkaline Phosphatase: 63 (ref 25–125)
Bilirubin, Total: 0.3

## 2021-04-04 LAB — CBC AND DIFFERENTIAL
HCT: 43 (ref 36–46)
Hemoglobin: 14.3 (ref 12.0–16.0)
Platelets: 281 10*3/uL (ref 150–400)
WBC: 4.6

## 2021-04-04 LAB — COMPREHENSIVE METABOLIC PANEL
Albumin: 4.1 (ref 3.5–5.0)
Calcium: 9.4 (ref 8.7–10.7)
eGFR: 90

## 2021-04-04 LAB — BASIC METABOLIC PANEL
BUN: 8 (ref 4–21)
CO2: 33 — AB (ref 13–22)
Chloride: 105 (ref 99–108)
Creatinine: 0.8 (ref 0.5–1.1)
Glucose: 80
Potassium: 4.6 mEq/L (ref 3.5–5.1)
Sodium: 144 (ref 137–147)

## 2021-04-04 LAB — CBC: RBC: 4.95 (ref 3.87–5.11)

## 2021-04-04 LAB — IRON,TIBC AND FERRITIN PANEL: Ferritin: 33.3

## 2021-04-04 LAB — VITAMIN D 25 HYDROXY (VIT D DEFICIENCY, FRACTURES): Vit D, 25-Hydroxy: 78.8

## 2021-04-05 ENCOUNTER — Other Ambulatory Visit (HOSPITAL_BASED_OUTPATIENT_CLINIC_OR_DEPARTMENT_OTHER): Payer: Self-pay

## 2021-04-05 ENCOUNTER — Telehealth (INDEPENDENT_AMBULATORY_CARE_PROVIDER_SITE_OTHER): Payer: No Typology Code available for payment source | Admitting: Family Medicine

## 2021-04-05 DIAGNOSIS — Z6841 Body Mass Index (BMI) 40.0 and over, adult: Secondary | ICD-10-CM

## 2021-04-05 DIAGNOSIS — E669 Obesity, unspecified: Secondary | ICD-10-CM

## 2021-04-05 DIAGNOSIS — E559 Vitamin D deficiency, unspecified: Secondary | ICD-10-CM

## 2021-04-05 DIAGNOSIS — Z6835 Body mass index (BMI) 35.0-35.9, adult: Secondary | ICD-10-CM

## 2021-04-05 DIAGNOSIS — R632 Polyphagia: Secondary | ICD-10-CM

## 2021-04-05 MED ORDER — VITAMIN D (ERGOCALCIFEROL) 1.25 MG (50000 UNIT) PO CAPS
ORAL_CAPSULE | ORAL | 0 refills | Status: DC
  Filled 2021-04-05: qty 12, fill #0

## 2021-04-05 MED ORDER — WEGOVY 1.7 MG/0.75ML ~~LOC~~ SOAJ
1.7000 mg | SUBCUTANEOUS | 0 refills | Status: DC
  Filled 2021-04-05: qty 3, 30d supply, fill #0

## 2021-04-05 NOTE — Progress Notes (Signed)
?TeleHealth Visit:  ?Due to the COVID-19 pandemic, this visit was completed with telemedicine (audio/video) technology to reduce patient and provider exposure as well as to preserve personal protective equipment.  ? ?Laura Brown has verbally consented to this TeleHealth visit. The patient is located at home, the provider is located at home. The participants in this visit include the listed provider and patient. The visit was conducted today via MyChart video. ? ?OBESITY ?Laura Brown is here to discuss her progress with her obesity treatment plan along with follow-up of her obesity related diagnoses.  ? ?Today's date: 04/05/2021 ?Today's visit was #: 60 ?Starting weight: 296 ?Starting date: 11/07/2017 ?Today's reported weight: 217 lbs ?Total weight loss: 79 lbs ?Weight change since last visit:  - 6 bs ? ? ? ?Interim History: Laura Brown has done very well.  She has lost 6 pounds over last few weeks.  She is journaling consistently and meeting her protein goals most days.  She averages about 1200 cal/day.  She recently got a recumbent bike as and has been using it 2-3 times per week. ? ?Nutrition Plan: keeping a food journal and adhering to recommended goals of 1200-1400 calories and 80 protein.  ?Hunger is well controlled. Cravings are well controlled.  ?Activity: walking at work and she is riding her recumbent bike 2-3 times per week ? ?Assessment/Plan:  ?Vitamin D Deficiency ?Vitamin D is not at goal of 50. She is on weekly prescription Vitamin D 50,000 IU.  ?Lab Results  ?Component Value Date  ? VD25OH 49.3 12/27/2020  ? VD25OH 41.0 07/15/2019  ? VD25OH 44.6 03/25/2019  ? ? ?Plan: ?Refill prescription vitamin D 50,000 IU weekly.  ? ?2. Polyphagia ?Appetite well controlled with Wegovy 1.7 mg weekly ? ?Plan: ?Refill Wegovy 1.7 mg weekly ? ? ?3. Obesity: Current BMI 35.00 ? ?Laura Brown is currently in the action stage of change. As such, her goal is to continue with weight loss efforts. She has agreed to keeping a food journal  and adhering to recommended goals of 1200-1400  calories and 80 gms protein.  ? ?Exercise goals: Continue walking and riding recumbent bike. ? ?Behavioral modification strategies: planning for success. ? ?Laura Brown has agreed to follow-up with our clinic in 4 weeks.  ?Objective:  ? ?VITALS: Per patient if applicable, see vitals. ?GENERAL: Alert and in no acute distress. ?CARDIOPULMONARY: No increased WOB. Speaking in clear sentences.  ?PSYCH: Pleasant and cooperative. Speech normal rate and rhythm. Affect is appropriate. Insight and judgement are appropriate. Attention is focused, linear, and appropriate.  ?NEURO: Oriented as arrived to appointment on time with no prompting.  ? ?Lab Results  ?Component Value Date  ? CREATININE 0.76 12/27/2020  ? BUN 11 12/27/2020  ? NA 141 12/27/2020  ? K 4.9 12/27/2020  ? CL 103 12/27/2020  ? CO2 25 12/27/2020  ? ?Lab Results  ?Component Value Date  ? ALT 6 12/27/2020  ? AST 13 12/27/2020  ? ALKPHOS 78 12/27/2020  ? BILITOT 0.2 12/27/2020  ? ?Lab Results  ?Component Value Date  ? HGBA1C 5.2 12/27/2020  ? HGBA1C 4.6 (L) 07/15/2019  ? HGBA1C 4.6 (L) 03/25/2019  ? HGBA1C 4.7 (L) 11/26/2018  ? HGBA1C 4.9 11/07/2017  ? ?Lab Results  ?Component Value Date  ? INSULIN 3.0 12/27/2020  ? INSULIN 8.6 07/15/2019  ? INSULIN 9.7 03/25/2019  ? INSULIN 6.1 11/26/2018  ? INSULIN 6.7 11/07/2017  ? ?Lab Results  ?Component Value Date  ? TSH 1.340 12/27/2020  ? ?Lab Results  ?Component Value Date  ?  CHOL 197 12/27/2020  ? HDL 75 12/27/2020  ? LDLCALC 111 (H) 12/27/2020  ? TRIG 57 12/27/2020  ? ?Lab Results  ?Component Value Date  ? WBC 4.2 12/27/2020  ? HGB 10.4 (L) 12/27/2020  ? HCT 33.5 (L) 12/27/2020  ? MCV 80 12/27/2020  ? PLT 336 12/27/2020  ? ?Lab Results  ?Component Value Date  ? FERRITIN 32 03/22/2011  ? ?Lab Results  ?Component Value Date  ? VD25OH 49.3 12/27/2020  ? VD25OH 41.0 07/15/2019  ? VD25OH 44.6 03/25/2019  ? ? ?Attestation Statements:  ? ?Reviewed by clinician on day of visit:  allergies, medications, problem list, medical history, surgical history, family history, social history, and previous encounter notes. ? ? ? ? ?

## 2021-04-06 ENCOUNTER — Encounter (INDEPENDENT_AMBULATORY_CARE_PROVIDER_SITE_OTHER): Payer: Self-pay | Admitting: Family Medicine

## 2021-04-10 ENCOUNTER — Other Ambulatory Visit (HOSPITAL_BASED_OUTPATIENT_CLINIC_OR_DEPARTMENT_OTHER): Payer: Self-pay

## 2021-04-10 MED ORDER — VERAPAMIL HCL ER 100 MG PO CP24: 200.0000 mg | ORAL_CAPSULE | Freq: Every day | ORAL | 3 refills | Status: DC

## 2021-04-14 ENCOUNTER — Other Ambulatory Visit (HOSPITAL_COMMUNITY): Payer: Self-pay

## 2021-04-17 ENCOUNTER — Other Ambulatory Visit (HOSPITAL_COMMUNITY): Payer: Self-pay

## 2021-04-18 ENCOUNTER — Other Ambulatory Visit (HOSPITAL_COMMUNITY): Payer: Self-pay

## 2021-04-24 ENCOUNTER — Other Ambulatory Visit (HOSPITAL_COMMUNITY): Payer: Self-pay

## 2021-05-02 NOTE — Progress Notes (Signed)
?TeleHealth Visit:  ?Due to the COVID-19 pandemic, this visit was completed with telemedicine (audio/video) technology to reduce patient and provider exposure as well as to preserve personal protective equipment.  ? ?Laura Brown has verbally consented to this TeleHealth visit. The patient is located at home, the provider is located at home. The participants in this visit include the listed provider and patient. The visit was conducted today via MyChart video. ? ?OBESITY ?Laura Brown is here to discuss her progress with her obesity treatment plan along with follow-up of her obesity related diagnoses.  ? ?Today's visit was # 63 ?Starting weight: 296 lbs ?Starting date: 11/07/2017 ?Weight reported at last virtual office visit: 217 lbs ?Today's reported weight: 210 lbs  ?Total weight loss: 86 lbs ?Weight change since last visit: -7 ? ?Nutrition Plan: keeping a food journal and adhering to recommended goals of 1200-1400 calories and 80 protein.  ?Hunger is well controlled. Cravings are well controlled.  ?Current exercise:  Recumbent bike for 20 minutes 5 days/week. ? ?Interim History: Laura Brown is doing extremely well with the plan.  She has lost 7 pounds since her last virtual visit 3 to 4 weeks ago.  She says that she feels better than she did a decade ago since her bilateral total knee replacements and weight loss.  She has lost 25 pounds since October when she started back on the program after a 6 month hiatus. ?Her ultimate weight goal is 185 pounds-29 BMI.  ?Laura Brown is managing her appetite very well.  She gets in her protein but struggles to get in enough calories, some days only taking in 800 cal. ?She is status post sleeve gastrectomy in 2013 and still has portion restriction from the surgery. ? ?Assessment/Plan:  ?1. Polyphagia ?Hunger and cravings are very well controlled with 1.7 mg of Wegovy.  She denies side effects. ? ?Plan: ?Refill Wegovy at 1.7 mg weekly. ? ?2. Breast tenderness in female ?She notes breast  tenderness bilaterally recently.  Left a little bit more tender than the right.  She had a mammogram 2 months ago which was normal.  She is postmenopausal, having her last period a little over a year ago.  She also notes some fatigue. ?She says she has not bought any new bras since her weight loss and feels maybe this could be contributing to the tenderness. ? ?Plan: ?Advised her that this is likely due to menopause. ?Encouraged her to buy some soft, supportive, well fitting bras. ? ?3. Obesity: Current BMI 33.91 ?Laura Brown is currently in the action stage of change. As such, her goal is to continue with weight loss efforts. She has agreed to keeping a food journal and adhering to recommended goals of 1200-1400 calories and 80 gms protein.  ? ?Exercise goals:  Continue current regimen of riding her recumbent bike 20 minutes 5 days/week.  Advised her to keep good tension on it to strengthen leg muscles.  Encouraged resistance training for arms with hand weights. ? ?Behavioral modification strategies: I encouraged her to increase her calorie intake to least to 1000 cal/day with healthy calorie dense snacks such as 1/4 cup of nuts. ? ?We will request labs from PCP. ? ?Laura Brown has agreed to follow-up with our clinic in 3 weeks.  ? ?No orders of the defined types were placed in this encounter. ? ? ?Medications Discontinued During This Encounter  ?Medication Reason  ? Semaglutide-Weight Management (WEGOVY) 1.7 MG/0.75ML SOAJ Reorder  ?  ? ?Meds ordered this encounter  ?Medications  ? Semaglutide-Weight Management (WEGOVY) 1.7  MG/0.75ML SOAJ  ?  Sig: Inject 1.7 mg into the skin once a week.  ?  Dispense:  3 mL  ?  Refill:  0  ?  Order Specific Question:   Supervising Provider  ?  Answer:   Laura Brown  ?   ? ?Objective:  ? ?VITALS: Per patient if applicable, see vitals. ?GENERAL: Alert and in no acute distress. ?CARDIOPULMONARY: No increased WOB. Speaking in clear sentences.  ?PSYCH: Pleasant and cooperative.  Speech normal rate and rhythm. Affect is appropriate. Insight and judgement are appropriate. Attention is focused, linear, and appropriate.  ?NEURO: Oriented as arrived to appointment on time with no prompting.  ? ?Lab Results  ?Component Value Date  ? CREATININE 0.76 12/27/2020  ? BUN 11 12/27/2020  ? NA 141 12/27/2020  ? K 4.9 12/27/2020  ? CL 103 12/27/2020  ? CO2 25 12/27/2020  ? ?Lab Results  ?Component Value Date  ? ALT 6 12/27/2020  ? AST 13 12/27/2020  ? ALKPHOS 78 12/27/2020  ? BILITOT 0.2 12/27/2020  ? ?Lab Results  ?Component Value Date  ? HGBA1C 5.2 12/27/2020  ? HGBA1C 4.6 (L) 07/15/2019  ? HGBA1C 4.6 (L) 03/25/2019  ? HGBA1C 4.7 (L) 11/26/2018  ? HGBA1C 4.9 11/07/2017  ? ?Lab Results  ?Component Value Date  ? INSULIN 3.0 12/27/2020  ? INSULIN 8.6 07/15/2019  ? INSULIN 9.7 03/25/2019  ? INSULIN 6.1 11/26/2018  ? INSULIN 6.7 11/07/2017  ? ?Lab Results  ?Component Value Date  ? TSH 1.340 12/27/2020  ? ?Lab Results  ?Component Value Date  ? CHOL 197 12/27/2020  ? HDL 75 12/27/2020  ? LDLCALC 111 (H) 12/27/2020  ? TRIG 57 12/27/2020  ? ?Lab Results  ?Component Value Date  ? WBC 4.2 12/27/2020  ? HGB 10.4 (L) 12/27/2020  ? HCT 33.5 (L) 12/27/2020  ? MCV 80 12/27/2020  ? PLT 336 12/27/2020  ? ?Lab Results  ?Component Value Date  ? FERRITIN 32 03/22/2011  ? ?Lab Results  ?Component Value Date  ? VD25OH 49.3 12/27/2020  ? VD25OH 41.0 07/15/2019  ? VD25OH 44.6 03/25/2019  ? ? ?Attestation Statements:  ? ?Reviewed by clinician on day of visit: allergies, medications, problem list, medical history, surgical history, family history, social history, and previous encounter notes. ? ? ?

## 2021-05-03 ENCOUNTER — Other Ambulatory Visit (HOSPITAL_BASED_OUTPATIENT_CLINIC_OR_DEPARTMENT_OTHER): Payer: Self-pay

## 2021-05-03 ENCOUNTER — Telehealth (INDEPENDENT_AMBULATORY_CARE_PROVIDER_SITE_OTHER): Payer: No Typology Code available for payment source | Admitting: Family Medicine

## 2021-05-03 ENCOUNTER — Encounter (INDEPENDENT_AMBULATORY_CARE_PROVIDER_SITE_OTHER): Payer: Self-pay | Admitting: Family Medicine

## 2021-05-03 VITALS — Ht 66.0 in | Wt 210.0 lb

## 2021-05-03 DIAGNOSIS — Z6833 Body mass index (BMI) 33.0-33.9, adult: Secondary | ICD-10-CM

## 2021-05-03 DIAGNOSIS — E669 Obesity, unspecified: Secondary | ICD-10-CM

## 2021-05-03 DIAGNOSIS — R632 Polyphagia: Secondary | ICD-10-CM

## 2021-05-03 DIAGNOSIS — N644 Mastodynia: Secondary | ICD-10-CM | POA: Diagnosis not present

## 2021-05-03 MED ORDER — WEGOVY 1.7 MG/0.75ML ~~LOC~~ SOAJ
1.7000 mg | SUBCUTANEOUS | 0 refills | Status: DC
  Filled 2021-05-03: qty 3, 30d supply, fill #0

## 2021-05-09 ENCOUNTER — Other Ambulatory Visit (HOSPITAL_BASED_OUTPATIENT_CLINIC_OR_DEPARTMENT_OTHER): Payer: Self-pay

## 2021-05-09 ENCOUNTER — Telehealth: Payer: No Typology Code available for payment source | Admitting: Physician Assistant

## 2021-05-09 DIAGNOSIS — L989 Disorder of the skin and subcutaneous tissue, unspecified: Secondary | ICD-10-CM | POA: Diagnosis not present

## 2021-05-09 MED ORDER — AMOXICILLIN-POT CLAVULANATE 875-125 MG PO TABS
1.0000 | ORAL_TABLET | Freq: Two times a day (BID) | ORAL | 0 refills | Status: DC
  Filled 2021-05-09: qty 20, 10d supply, fill #0

## 2021-05-09 NOTE — Progress Notes (Signed)
E Visit for Rash ? ?We are sorry that you are not feeling well. Here is how we plan to help! ? ?It is sometimes difficult to make an accurate diagnosis with a picture but it looks like you may have a paronychia or an infection to the end of your finger. Typically these would require an incision and drainage procedure, but can sometimes drain spontaneously. I would recommend soaking your finger in warm soapy water at least 3 times per day for 15 minutes for the next 5 days. I will also prescribe augmentin which is an antibiotic to help with the infection. If this does not improve symptoms you will need to be seen in person.  ? ? ?GET HELP RIGHT AWAY IF: ? ?Symptoms don't go away after treatment. ?Severe itching that persists. ?If you rash spreads or swells. ?If you rash begins to smell. ?If it blisters and opens or develops a yellow-brown crust. ?You develop a fever. ?You have a sore throat. ?You become short of breath. ? ?MAKE SURE YOU: ? ?Understand these instructions. ?Will watch your condition. ?Will get help right away if you are not doing well or get worse. ? ?Thank you for choosing an e-visit. ? ?Your e-visit answers were reviewed by a board certified advanced clinical practitioner to complete your personal care plan. Depending upon the condition, your plan could have included both over the counter or prescription medications. ? ?Please review your pharmacy choice. Make sure the pharmacy is open so you can pick up prescription now. If there is a problem, you may contact your provider through [employer] and have the prescription routed to another pharmacy.  Your safety is important to [place]. If you have drug allergies check your prescription carefully.  ? ?For the next 24 hours you can use MyChart to ask questions about today's visit, request a non-urgent call back, or ask for a work or school excuse. ?You will get an email in the next two days asking about your experience. I hope that your e-visit has been  valuable and will speed your recovery. ? ?Approximately 5 minutes was spent documenting and reviewing patient's chart. ? ?

## 2021-05-11 ENCOUNTER — Other Ambulatory Visit (HOSPITAL_COMMUNITY): Payer: Self-pay

## 2021-05-15 ENCOUNTER — Other Ambulatory Visit (HOSPITAL_COMMUNITY): Payer: Self-pay

## 2021-05-16 ENCOUNTER — Telehealth: Payer: Self-pay

## 2021-05-16 ENCOUNTER — Other Ambulatory Visit (HOSPITAL_COMMUNITY): Payer: Self-pay

## 2021-05-16 NOTE — Telephone Encounter (Signed)
Patient Advocate Encounter ? ?Prior Authorization for [employer] 30mg  tabs has been approved.   ? ?PA# [id number] ? ?Effective dates: 05/16/21 through 05/16/22 ? ?Per Test Claim Patients co-pay is $0.  ? ?Spoke with Pharmacy to Process. ? ?Patient Advocate ?Fax: [phone]  ?

## 2021-05-23 NOTE — Progress Notes (Signed)
?TeleHealth Visit:  ?This visit was completed with telemedicine (audio/video) technology. ?[name] has verbally consented to this TeleHealth visit. The patient is located at home, the provider is located at home. The participants in this visit include the listed provider and patient. The visit was conducted today via MyChart video. ? ?OBESITY ?Laura Brown is here to discuss her progress with her obesity treatment plan along with follow-up of her obesity related diagnoses.  ? ?Today's visit was # 23 ?Starting weight: 296 lbs ?Starting date: 11/07/2017 ?Weight reported at last virtual office visit: 210 lbs ?Today's reported weight: 209 lbs  ?Total weight loss: 87 lbs ? ?Nutrition Plan: keeping a food journal and adhering to recommended goals of 1200-1400 calories and 80 gms protein.  ?Hunger is well controlled. Cravings are well controlled.  ?Current exercise: Recumbent bike a few days/week. Reports she has not been as regular with this.  ? ?Interim History: Aris admits to not journaling consistently recently but plans on getting back to this since she has only lost a pound over the last few weeks. ?She reports she is now getting in more calories.  At last visit she reported only getting about 800 cal some days. ?Her goal weight is 185 pounds-29 BMI ? ?She recently had labs done at her PCP.  We will try to obtain these. ? ?Assessment/Plan:  ?1. Polyphagia ?Appetite currently well controlled with Wegovy 1.7 mg weekly. ?She had some nausea last week but was taking a course of Augmentin for a finger injury. ? ?Plan: ?Refill Wegovy 1.7 mg weekly ? ?2.  Breast tenderness in female ?She reported bilateral breast tenderness at last office visit.  Had her mammogram a few months ago which was normal.  She bought a more supportive sports bra and reports this discomfort has subsided. ? ?Plan : ?Continue to monitor.  If this recurs will suggest that she see gynecology. ? ?3.  Obesity: Current BMI 33.75 ?[name] is currently  in the action stage of change. As such, her goal is to continue with weight loss efforts.  ?She has agreed to keeping a food journal and adhering to recommended goals of 1200-1400 calories and 80 gms protein.  ? ?Exercise goals:  Increase frequency of recumbent bike rides.  Discussed that she should try to add some strength training for her arms.  She has some weights at home. ? ?Behavioral modification strategies: planning for success and keeping a strict food journal. ? ?[name] has agreed to follow-up with our clinic in 4 weeks.  ? ?No orders of the defined types were placed in this encounter. ? ? ?Medications Discontinued During This Encounter  ?Medication Reason  ? amoxicillin-clavulanate (AUGMENTIN) 875-125 MG tablet Completed Course  ? Semaglutide-Weight Management (WEGOVY) 1.7 MG/0.75ML SOAJ Reorder  ?  ? ?Meds ordered this encounter  ?Medications  ? Semaglutide-Weight Management (WEGOVY) 1.7 MG/0.75ML SOAJ  ?  Sig: Inject 1.7 mg into the skin once a week.  ?  Dispense:  3 mL  ?  Refill:  0  ?  Order Specific Question:   Supervising Provider  ?  Answer:   Dennard Nip D [id number]  ?   ? ?Objective:  ? ?VITALS: Per patient if applicable, see vitals. ?GENERAL: Alert and in no acute distress. ?CARDIOPULMONARY: No increased WOB. Speaking in clear sentences.  ?PSYCH: Pleasant and cooperative. Speech normal rate and rhythm. Affect is appropriate. Insight and judgement are appropriate. Attention is focused, linear, and appropriate.  ?NEURO: Oriented as arrived to appointment on time with no prompting.  ? ?  Lab Results  ?Component Value Date  ? CREATININE 0.76 12/27/2020  ? BUN 11 12/27/2020  ? NA 141 12/27/2020  ? K 4.9 12/27/2020  ? CL 103 12/27/2020  ? CO2 25 12/27/2020  ? ?Lab Results  ?Component Value Date  ? ALT 6 12/27/2020  ? AST 13 12/27/2020  ? ALKPHOS 78 12/27/2020  ? BILITOT 0.2 12/27/2020  ? ?Lab Results  ?Component Value Date  ? HGBA1C 5.2 12/27/2020  ? HGBA1C 4.6 (L) 07/15/2019  ? HGBA1C 4.6 (L)  03/25/2019  ? HGBA1C 4.7 (L) 11/26/2018  ? HGBA1C 4.9 11/07/2017  ? ?Lab Results  ?Component Value Date  ? INSULIN 3.0 12/27/2020  ? INSULIN 8.6 07/15/2019  ? INSULIN 9.7 03/25/2019  ? INSULIN 6.1 11/26/2018  ? INSULIN 6.7 11/07/2017  ? ?Lab Results  ?Component Value Date  ? TSH 1.340 12/27/2020  ? ?Lab Results  ?Component Value Date  ? CHOL 197 12/27/2020  ? HDL 75 12/27/2020  ? LDLCALC 111 (H) 12/27/2020  ? TRIG 57 12/27/2020  ? ?Lab Results  ?Component Value Date  ? WBC 4.2 12/27/2020  ? HGB 10.4 (L) 12/27/2020  ? HCT 33.5 (L) 12/27/2020  ? MCV 80 12/27/2020  ? PLT 336 12/27/2020  ? ?Lab Results  ?Component Value Date  ? FERRITIN 32 03/22/2011  ? ?Lab Results  ?Component Value Date  ? VD25OH 49.3 12/27/2020  ? VD25OH 41.0 07/15/2019  ? VD25OH 44.6 03/25/2019  ? ? ?Attestation Statements:  ? ?Reviewed by clinician on day of visit: allergies, medications, problem list, medical history, surgical history, family history, social history, and previous encounter notes. ? ? ? ?

## 2021-05-24 ENCOUNTER — Other Ambulatory Visit (HOSPITAL_BASED_OUTPATIENT_CLINIC_OR_DEPARTMENT_OTHER): Payer: Self-pay

## 2021-05-24 ENCOUNTER — Telehealth (INDEPENDENT_AMBULATORY_CARE_PROVIDER_SITE_OTHER): Payer: No Typology Code available for payment source | Admitting: Family Medicine

## 2021-05-24 ENCOUNTER — Encounter (INDEPENDENT_AMBULATORY_CARE_PROVIDER_SITE_OTHER): Payer: Self-pay | Admitting: Family Medicine

## 2021-05-24 VITALS — Ht 66.0 in | Wt 209.0 lb

## 2021-05-24 DIAGNOSIS — E669 Obesity, unspecified: Secondary | ICD-10-CM | POA: Diagnosis not present

## 2021-05-24 DIAGNOSIS — Z6833 Body mass index (BMI) 33.0-33.9, adult: Secondary | ICD-10-CM | POA: Diagnosis not present

## 2021-05-24 DIAGNOSIS — R632 Polyphagia: Secondary | ICD-10-CM | POA: Diagnosis not present

## 2021-05-24 DIAGNOSIS — N644 Mastodynia: Secondary | ICD-10-CM | POA: Diagnosis not present

## 2021-05-24 MED ORDER — WEGOVY 1.7 MG/0.75ML ~~LOC~~ SOAJ
1.7000 mg | SUBCUTANEOUS | 0 refills | Status: DC
  Filled 2021-05-24 – 2021-05-29 (×2): qty 3, 28d supply, fill #0

## 2021-05-29 ENCOUNTER — Other Ambulatory Visit (HOSPITAL_BASED_OUTPATIENT_CLINIC_OR_DEPARTMENT_OTHER): Payer: Self-pay

## 2021-05-30 ENCOUNTER — Telehealth (INDEPENDENT_AMBULATORY_CARE_PROVIDER_SITE_OTHER): Payer: Self-pay

## 2021-05-30 ENCOUNTER — Other Ambulatory Visit (HOSPITAL_BASED_OUTPATIENT_CLINIC_OR_DEPARTMENT_OTHER): Payer: Self-pay

## 2021-05-30 NOTE — Telephone Encounter (Signed)
Call to [facility] office to get recent labs.  Spoke with [employer].   ?

## 2021-06-01 ENCOUNTER — Other Ambulatory Visit (INDEPENDENT_AMBULATORY_CARE_PROVIDER_SITE_OTHER): Payer: Self-pay

## 2021-06-01 ENCOUNTER — Other Ambulatory Visit (HOSPITAL_COMMUNITY): Payer: Self-pay

## 2021-06-08 ENCOUNTER — Other Ambulatory Visit (HOSPITAL_BASED_OUTPATIENT_CLINIC_OR_DEPARTMENT_OTHER): Payer: Self-pay

## 2021-06-12 ENCOUNTER — Other Ambulatory Visit (HOSPITAL_COMMUNITY): Payer: Self-pay

## 2021-06-20 NOTE — Progress Notes (Unsigned)
TeleHealth Visit:  This visit was completed with telemedicine (audio/video) technology. Laura Brown has verbally consented to this TeleHealth visit. The patient is located at home, the provider is located at home. The participants in this visit include the listed provider and patient. The visit was conducted today via MyChart video.  OBESITY Laura Brown is here to discuss her progress with her obesity treatment plan along with follow-up of her obesity related diagnoses.   Today's visit was # 57 Starting weight: 296 lbs Starting date: 11/07/2017 Weight reported at last virtual office visit: 209 lbs Today's reported weight: 206 lbs  Total weight loss: 90 lbs   Nutrition Plan: keeping a food journal and adhering to recommended goals of 1200-1400 calories and 80 gms protein.  Hunger is well controlled. Cravings are well controlled.  Current exercise: walking and recumbent bike several day per week.   Interim History: Laura Brown is journaling regularly and meeting protein and calorie goals.  Appetite is well controlled-at times she has to remind herself to eat. Today she is down a total of 90 pounds!  Assessment/Plan:  1. Iron deficiency anemia Anemia is stable.  Hemoglobin is up from 10.4 in November 2022.  It took her hemoglobin a while to recover after her total knee replacement in June 2022.  She is also status post sleeve gastrectomy in 2013. Iron supplementation: Iron 325 mg by mouth daily Lab Results  Component Value Date   WBC 4.6 04/04/2021   HGB 14.3 04/04/2021   HCT 43 04/04/2021   MCV 80 12/27/2020   PLT 281 04/04/2021   Lab Results  Component Value Date   FERRITIN 33.3 04/04/2021     Plan: Continue supplementation at current dose  2. Vitamin D Deficiency Vitamin D is at goal of 50. She is on weekly prescription Vitamin D 50,000 IU.  Lab Results  Component Value Date   VD25OH 78.8 04/04/2021   VD25OH 49.3 12/27/2020   VD25OH 41.0 07/15/2019    Plan: Discontinue  prescription vitamin D 50,000 IU weekly. Start OTC vitamin D3 5000 IU daily. She has some 2000 IU capsules she needs to use up.  She will take 6000 IU until those run out and then take 5000 daily.   3. Polyphagia Medication(s): Wegovy 1.7 mg weekly. Effects of medication:  well controlled. Cravings are well controlled.  Has occasional nausea.  Plan: Refill Wegovy 1.7 mg weekly.  4. Obesity: Current BMI 33.27 Laura Brown is currently in the action stage of change. As such, her goal is to continue with weight loss efforts.  She has agreed to keeping a food journal and adhering to recommended goals of 1200-1400 calories and 80 gms protein. [name] Kitchen   Exercise goals: as is.  Behavioral modification strategies: increasing lean protein intake, decreasing simple carbohydrates, and keeping a strict food journal.  Laura Brown has agreed to follow-up with our clinic in 4 weeks.   No orders of the defined types were placed in this encounter.   Medications Discontinued During This Encounter  Medication Reason   Semaglutide-Weight Management (WEGOVY) 1.7 MG/0.75ML SOAJ Reorder   Vitamin D, Ergocalciferol, (DRISDOL) 1.25 MG (50000 UNIT) CAPS capsule Dose change     Meds ordered this encounter  Medications   Semaglutide-Weight Management (WEGOVY) 1.7 MG/0.75ML SOAJ    Sig: Inject 1.7 mg into the skin once a week.    Dispense:  3 mL    Refill:  0    Order Specific Question:   Supervising Provider    Answer:   Dennard Nip D [  [id number]]   Cholecalciferol (VITAMIN D) 125 MCG (5000 UT) CAPS    Sig: Take 1 capsule by mouth daily.    Dispense:  30 capsule    Refill:  0    Order Specific Question:   Supervising Provider    Answer:   Dennard Nip D [[id number]]      Objective:   VITALS: Per patient if applicable, see vitals. GENERAL: Alert and in no acute distress. CARDIOPULMONARY: No increased WOB. Speaking in clear sentences.  PSYCH: Pleasant and cooperative. Speech normal rate and rhythm. Affect is  appropriate. Insight and judgement are appropriate. Attention is focused, linear, and appropriate.  NEURO: Oriented as arrived to appointment on time with no prompting.   Lab Results  Component Value Date   CREATININE 0.8 04/04/2021   BUN 8 04/04/2021   NA 144 04/04/2021   K 4.6 04/04/2021   CL 105 04/04/2021   CO2 33 (A) 04/04/2021   Lab Results  Component Value Date   ALT 7 04/04/2021   AST 13 04/04/2021   ALKPHOS 63 04/04/2021   BILITOT 0.2 12/27/2020   Lab Results  Component Value Date   HGBA1C 5.2 12/27/2020   HGBA1C 4.6 (L) 07/15/2019   HGBA1C 4.6 (L) 03/25/2019   HGBA1C 4.7 (L) 11/26/2018   HGBA1C 4.9 11/07/2017   Lab Results  Component Value Date   INSULIN 3.0 12/27/2020   INSULIN 8.6 07/15/2019   INSULIN 9.7 03/25/2019   INSULIN 6.1 11/26/2018   INSULIN 6.7 11/07/2017   Lab Results  Component Value Date   TSH 0.94 01/06/2021   Lab Results  Component Value Date   CHOL 202 (A) 01/06/2021   HDL 82 (A) 01/06/2021   LDLCALC 109 01/06/2021   TRIG 57 01/06/2021   Lab Results  Component Value Date   WBC 4.6 04/04/2021   HGB 14.3 04/04/2021   HCT 43 04/04/2021   MCV 80 12/27/2020   PLT 281 04/04/2021   Lab Results  Component Value Date   FERRITIN 33.3 04/04/2021   Lab Results  Component Value Date   VD25OH 78.8 04/04/2021   VD25OH 49.3 12/27/2020   VD25OH 41.0 07/15/2019    Attestation Statements:   Reviewed by clinician on day of visit: allergies, medications, problem list, medical history, surgical history, family history, social history, and previous encounter notes.

## 2021-06-21 ENCOUNTER — Other Ambulatory Visit (HOSPITAL_BASED_OUTPATIENT_CLINIC_OR_DEPARTMENT_OTHER): Payer: Self-pay

## 2021-06-21 ENCOUNTER — Telehealth (INDEPENDENT_AMBULATORY_CARE_PROVIDER_SITE_OTHER): Payer: No Typology Code available for payment source | Admitting: Family Medicine

## 2021-06-21 ENCOUNTER — Encounter (INDEPENDENT_AMBULATORY_CARE_PROVIDER_SITE_OTHER): Payer: Self-pay | Admitting: Family Medicine

## 2021-06-21 VITALS — Ht 66.0 in | Wt 206.0 lb

## 2021-06-21 DIAGNOSIS — E559 Vitamin D deficiency, unspecified: Secondary | ICD-10-CM | POA: Diagnosis not present

## 2021-06-21 DIAGNOSIS — Z6833 Body mass index (BMI) 33.0-33.9, adult: Secondary | ICD-10-CM

## 2021-06-21 DIAGNOSIS — R632 Polyphagia: Secondary | ICD-10-CM

## 2021-06-21 DIAGNOSIS — D649 Anemia, unspecified: Secondary | ICD-10-CM

## 2021-06-21 DIAGNOSIS — E669 Obesity, unspecified: Secondary | ICD-10-CM

## 2021-06-21 MED ORDER — VITAMIN D 125 MCG (5000 UT) PO CAPS: 1.0000 | ORAL_CAPSULE | Freq: Every day | ORAL | 0 refills | Status: AC

## 2021-06-21 MED ORDER — WEGOVY 1.7 MG/0.75ML ~~LOC~~ SOAJ
1.7000 mg | SUBCUTANEOUS | 0 refills | Status: DC
  Filled 2021-06-21: qty 3, 28d supply, fill #0

## 2021-07-04 ENCOUNTER — Other Ambulatory Visit (HOSPITAL_COMMUNITY): Payer: Self-pay

## 2021-07-06 ENCOUNTER — Telehealth: Payer: No Typology Code available for payment source | Admitting: Physician Assistant

## 2021-07-06 DIAGNOSIS — M545 Low back pain, unspecified: Secondary | ICD-10-CM | POA: Diagnosis not present

## 2021-07-06 DIAGNOSIS — M79605 Pain in left leg: Secondary | ICD-10-CM | POA: Diagnosis not present

## 2021-07-06 MED ORDER — CYCLOBENZAPRINE HCL 10 MG PO TABS: 10.0000 mg | ORAL_TABLET | Freq: Three times a day (TID) | ORAL | 0 refills | Status: DC | PRN

## 2021-07-06 MED ORDER — NAPROXEN 500 MG PO TABS: 500.0000 mg | ORAL_TABLET | Freq: Two times a day (BID) | ORAL | 0 refills | Status: DC

## 2021-07-06 NOTE — Progress Notes (Signed)

## 2021-07-06 NOTE — Progress Notes (Signed)
I have spent 5 minutes in review of e-visit questionnaire, review and updating patient chart, medical decision making and response to patient.   Ashanna Heinsohn Cody Moria Brophy, PA-C    

## 2021-07-11 ENCOUNTER — Other Ambulatory Visit (HOSPITAL_BASED_OUTPATIENT_CLINIC_OR_DEPARTMENT_OTHER): Payer: Self-pay

## 2021-07-11 MED ORDER — VERAPAMIL HCL ER 120 MG PO TBCR
EXTENDED_RELEASE_TABLET | ORAL | 1 refills | Status: AC
  Filled 2021-07-11: qty 90, 90d supply, fill #0
  Filled 2021-10-18: qty 10, 10d supply, fill #1
  Filled 2021-10-18: qty 80, 80d supply, fill #1

## 2021-07-12 ENCOUNTER — Other Ambulatory Visit (HOSPITAL_BASED_OUTPATIENT_CLINIC_OR_DEPARTMENT_OTHER): Payer: Self-pay

## 2021-07-12 ENCOUNTER — Other Ambulatory Visit (HOSPITAL_COMMUNITY): Payer: Self-pay

## 2021-07-18 NOTE — Progress Notes (Unsigned)
TeleHealth Visit:  This visit was completed with telemedicine (audio/video) technology. Laura Brown has verbally consented to this TeleHealth visit. The patient is located at home, the provider is located at home. The participants in this visit include the listed provider and patient. The visit was conducted today via MyChart video.  OBESITY Laura Brown is here to discuss her progress with her obesity treatment plan along with follow-up of her obesity related diagnoses.   Today's visit was # 48 lbs Starting weight: 296 lbs Starting date: 11/07/2017 Weight reported at last virtual office visit: 206 lbs Today's reported weight: 204 lbs  Total weight loss: 92 lbs Weight change since last visit: -2  Nutrition Plan: keeping a food journal and adhering to recommended goals of 1200-1400 calories and 80 gms protein.  Hunger is well controlled. Cravings are well controlled.  Current exercise: walk for 20 minutes 3 times week  Interim History: [name] is on vacation this week so she is not journaling.  However she is generally very consistent with journaling.  She reports calories have been low-sometimes as low as 800/day.  Protein is sometimes as low as 40/day.  She cites lack of appetite as the reason.  She has to remind herself to eat.  Assessment/Plan:  1. Polyphagia Medication(s): Wegovy 1.7 mg weekly-tolerating well. Effects of medication:  well controlled. Cravings are well controlled.   Plan: Refill Wegovy 1.7 mg subcu weekly.  2. Vitamin D Deficiency Vitamin D is at goal of 50.  Recently switched to over-the-counter vitamin D3 5000 IU daily from Rx 50,000 weekly. Lab Results  Component Value Date   VD25OH 78.8 04/04/2021   VD25OH 49.3 12/27/2020   VD25OH 41.0 07/15/2019    Plan: Continue vitamin D3 5000 IU daily.  3. Obesity: Current BMI 32.94 Laura Brown is currently in the action stage of change. As such, her goal is to continue with weight loss efforts.  She has agreed to  keeping a food journal and adhering to recommended goals of 1200-1400 calories and 80 gms protein.   Advised her to get a minimum of 1200 cal/day.  She will also work on increasing her protein.  Exercise goals:  as is.  Behavioral modification strategies: increasing lean protein intake.  Laura Brown has agreed to follow-up with our clinic in 4 weeks.   No orders of the defined types were placed in this encounter.   Medications Discontinued During This Encounter  Medication Reason   verapamil (VERELAN) 100 MG 24 hr capsule Duplicate   verapamil (VERELAN) 100 MG 24 hr capsule Duplicate   Semaglutide-Weight Management (WEGOVY) 1.7 MG/0.75ML SOAJ Reorder     Meds ordered this encounter  Medications   Semaglutide-Weight Management (WEGOVY) 1.7 MG/0.75ML SOAJ    Sig: Inject 1.7 mg into the skin once a week.    Dispense:  3 mL    Refill:  0    Order Specific Question:   Supervising Provider    Answer:   Quillian Quince D [[id number]]      Objective:   VITALS: Per patient if applicable, see vitals. GENERAL: Alert and in no acute distress. CARDIOPULMONARY: No increased WOB. Speaking in clear sentences.  PSYCH: Pleasant and cooperative. Speech normal rate and rhythm. Affect is appropriate. Insight and judgement are appropriate. Attention is focused, linear, and appropriate.  NEURO: Oriented as arrived to appointment on time with no prompting.   Lab Results  Component Value Date   CREATININE 0.8 04/04/2021   BUN 8 04/04/2021   NA 144 04/04/2021   K 4.6  04/04/2021   CL 105 04/04/2021   CO2 33 (A) 04/04/2021   Lab Results  Component Value Date   ALT 7 04/04/2021   AST 13 04/04/2021   ALKPHOS 63 04/04/2021   BILITOT 0.2 12/27/2020   Lab Results  Component Value Date   HGBA1C 5.2 12/27/2020   HGBA1C 4.6 (L) 07/15/2019   HGBA1C 4.6 (L) 03/25/2019   HGBA1C 4.7 (L) 11/26/2018   HGBA1C 4.9 11/07/2017   Lab Results  Component Value Date   INSULIN 3.0 12/27/2020   INSULIN 8.6  07/15/2019   INSULIN 9.7 03/25/2019   INSULIN 6.1 11/26/2018   INSULIN 6.7 11/07/2017   Lab Results  Component Value Date   TSH 0.94 01/06/2021   Lab Results  Component Value Date   CHOL 202 (A) 01/06/2021   HDL 82 (A) 01/06/2021   LDLCALC 109 01/06/2021   TRIG 57 01/06/2021   Lab Results  Component Value Date   WBC 4.6 04/04/2021   HGB 14.3 04/04/2021   HCT 43 04/04/2021   MCV 80 12/27/2020   PLT 281 04/04/2021   Lab Results  Component Value Date   FERRITIN 33.3 04/04/2021   Lab Results  Component Value Date   VD25OH 78.8 04/04/2021   VD25OH 49.3 12/27/2020   VD25OH 41.0 07/15/2019    Attestation Statements:   Reviewed by clinician on day of visit: allergies, medications, problem list, medical history, surgical history, family history, social history, and previous encounter notes.

## 2021-07-19 ENCOUNTER — Other Ambulatory Visit (HOSPITAL_BASED_OUTPATIENT_CLINIC_OR_DEPARTMENT_OTHER): Payer: Self-pay

## 2021-07-19 ENCOUNTER — Telehealth (INDEPENDENT_AMBULATORY_CARE_PROVIDER_SITE_OTHER): Payer: No Typology Code available for payment source | Admitting: Family Medicine

## 2021-07-19 ENCOUNTER — Encounter (INDEPENDENT_AMBULATORY_CARE_PROVIDER_SITE_OTHER): Payer: Self-pay | Admitting: Family Medicine

## 2021-07-19 VITALS — Ht 66.0 in | Wt 204.0 lb

## 2021-07-19 DIAGNOSIS — R632 Polyphagia: Secondary | ICD-10-CM | POA: Diagnosis not present

## 2021-07-19 DIAGNOSIS — E669 Obesity, unspecified: Secondary | ICD-10-CM

## 2021-07-19 DIAGNOSIS — E559 Vitamin D deficiency, unspecified: Secondary | ICD-10-CM | POA: Diagnosis not present

## 2021-07-19 DIAGNOSIS — Z6832 Body mass index (BMI) 32.0-32.9, adult: Secondary | ICD-10-CM

## 2021-07-19 MED ORDER — WEGOVY 1.7 MG/0.75ML ~~LOC~~ SOAJ
1.7000 mg | SUBCUTANEOUS | 0 refills | Status: DC
  Filled 2021-07-19 – 2021-07-27 (×2): qty 3, 28d supply, fill #0

## 2021-07-27 ENCOUNTER — Other Ambulatory Visit (HOSPITAL_BASED_OUTPATIENT_CLINIC_OR_DEPARTMENT_OTHER): Payer: Self-pay

## 2021-08-04 ENCOUNTER — Other Ambulatory Visit (HOSPITAL_BASED_OUTPATIENT_CLINIC_OR_DEPARTMENT_OTHER): Payer: Self-pay

## 2021-08-04 ENCOUNTER — Other Ambulatory Visit (HOSPITAL_COMMUNITY): Payer: Self-pay

## 2021-08-07 ENCOUNTER — Other Ambulatory Visit (HOSPITAL_COMMUNITY): Payer: Self-pay

## 2021-08-10 ENCOUNTER — Other Ambulatory Visit (HOSPITAL_BASED_OUTPATIENT_CLINIC_OR_DEPARTMENT_OTHER): Payer: Self-pay

## 2021-08-11 ENCOUNTER — Other Ambulatory Visit (HOSPITAL_COMMUNITY): Payer: Self-pay

## 2021-08-15 NOTE — Progress Notes (Deleted)
TeleHealth Visit:  This visit was completed with telemedicine (audio/video) technology. Laura Brown has verbally consented to this TeleHealth visit. The patient is located at home, the provider is located at home. The participants in this visit include the listed provider and patient. The visit was conducted today via MyChart video.  OBESITY Laura Brown is here to discuss her progress with her obesity treatment plan along with follow-up of her obesity related diagnoses.   Today's visit was # 49 Starting weight: 296 lbs Starting date: 11/07/2017 Weight reported at last virtual office visit: 204 lbs Today's reported weight: *** lbs  Total weight loss: *** lbs Weight change since last visit: ***  Nutrition Plan: keeping a food journal and adhering to recommended goals of 1200-1400 calories and 80 gms protein.  Hunger is {EWCONTROLASSESSMENT:24261}. Cravings are {EWCONTROLASSESSMENT:24261}.  Current exercise: {exercise types:16438}  Interim History: ***  Assessment/Plan:  1. ***  2. ***  3. ***  Obesity: Current BMI *** Laura Brown {CHL AMB IS/IS NOT:210130109} currently in the action stage of change. As such, her goal is to {MWMwtloss#1:210800005}.  She has agreed to {MWMwtlossportion/plan2:23431}.   Exercise goals: {MWM EXERCISE RECS:23473}  Behavioral modification strategies: {MWMwtlossdietstrategies3:23432}.  Laura Brown has agreed to follow-up with our clinic in {NUMBER 1-10:22536} weeks.   No orders of the defined types were placed in this encounter.   There are no discontinued medications.   No orders of the defined types were placed in this encounter.     Objective:   VITALS: Per patient if applicable, see vitals. GENERAL: Alert and in no acute distress. CARDIOPULMONARY: No increased WOB. Speaking in clear sentences.  PSYCH: Pleasant and cooperative. Speech normal rate and rhythm. Affect is appropriate. Insight and judgement are appropriate. Attention is focused, linear,  and appropriate.  NEURO: Oriented as arrived to appointment on time with no prompting.   Lab Results  Component Value Date   CREATININE 0.8 04/04/2021   BUN 8 04/04/2021   NA 144 04/04/2021   K 4.6 04/04/2021   CL 105 04/04/2021   CO2 33 (A) 04/04/2021   Lab Results  Component Value Date   ALT 7 04/04/2021   AST 13 04/04/2021   ALKPHOS 63 04/04/2021   BILITOT 0.2 12/27/2020   Lab Results  Component Value Date   HGBA1C 5.2 12/27/2020   HGBA1C 4.6 (L) 07/15/2019   HGBA1C 4.6 (L) 03/25/2019   HGBA1C 4.7 (L) 11/26/2018   HGBA1C 4.9 11/07/2017   Lab Results  Component Value Date   INSULIN 3.0 12/27/2020   INSULIN 8.6 07/15/2019   INSULIN 9.7 03/25/2019   INSULIN 6.1 11/26/2018   INSULIN 6.7 11/07/2017   Lab Results  Component Value Date   TSH 0.94 01/06/2021   Lab Results  Component Value Date   CHOL 202 (A) 01/06/2021   HDL 82 (A) 01/06/2021   LDLCALC 109 01/06/2021   TRIG 57 01/06/2021   Lab Results  Component Value Date   WBC 4.6 04/04/2021   HGB 14.3 04/04/2021   HCT 43 04/04/2021   MCV 80 12/27/2020   PLT 281 04/04/2021   Lab Results  Component Value Date   FERRITIN 33.3 04/04/2021   Lab Results  Component Value Date   VD25OH 78.8 04/04/2021   VD25OH 49.3 12/27/2020   VD25OH 41.0 07/15/2019    Attestation Statements:   Reviewed by clinician on day of visit: allergies, medications, problem list, medical history, surgical history, family history, social history, and previous encounter notes.  ***(delete if time-based billing not used) Time spent on  visit including the items listed below was *** minutes.  -preparing to see the patient (e.g., review of tests, history, previous notes) -obtaining and/or reviewing separately obtained history -counseling and educating the patient/family/caregiver -documenting clinical information in the electronic or other health record -ordering medications, tests, or procedures -independently interpreting results  and communicating results to the patient/ family/caregiver -referring and communicating with other health care professionals  -care coordination

## 2021-08-16 ENCOUNTER — Other Ambulatory Visit (HOSPITAL_BASED_OUTPATIENT_CLINIC_OR_DEPARTMENT_OTHER): Payer: Self-pay

## 2021-08-16 ENCOUNTER — Encounter (INDEPENDENT_AMBULATORY_CARE_PROVIDER_SITE_OTHER): Payer: Self-pay | Admitting: Family Medicine

## 2021-08-16 ENCOUNTER — Telehealth (INDEPENDENT_AMBULATORY_CARE_PROVIDER_SITE_OTHER): Payer: No Typology Code available for payment source | Admitting: Family Medicine

## 2021-08-16 VITALS — Ht 66.0 in | Wt 201.0 lb

## 2021-08-16 DIAGNOSIS — Z6832 Body mass index (BMI) 32.0-32.9, adult: Secondary | ICD-10-CM

## 2021-08-16 DIAGNOSIS — R632 Polyphagia: Secondary | ICD-10-CM | POA: Diagnosis not present

## 2021-08-16 DIAGNOSIS — E669 Obesity, unspecified: Secondary | ICD-10-CM | POA: Diagnosis not present

## 2021-08-16 DIAGNOSIS — D509 Iron deficiency anemia, unspecified: Secondary | ICD-10-CM | POA: Diagnosis not present

## 2021-08-16 MED ORDER — WEGOVY 1.7 MG/0.75ML ~~LOC~~ SOAJ
1.7000 mg | SUBCUTANEOUS | 0 refills | Status: DC
  Filled 2021-08-16 – 2021-08-18 (×2): qty 3, 28d supply, fill #0

## 2021-08-16 NOTE — Progress Notes (Signed)
TeleHealth Visit:  This visit was completed with telemedicine (audio/video) technology. Laura Brown has verbally consented to this TeleHealth visit. The patient is located at home, the provider is located at home. The participants in this visit include the listed provider and patient. The visit was conducted today via MyChart video.  OBESITY Laura Brown is here to discuss her progress with her obesity treatment plan along with follow-up of her obesity related diagnoses.   Today's visit was # 49 Starting weight: 296 lbs Starting date: 11/07/2017 Weight reported at last virtual office visit: 204 lbs Today's reported weight: 201 lbs  Total weight loss: 95  lbs Weight change since last visit: -3  Nutrition Plan: keeping a food journal and adhering to recommended goals of 1200-1400 calories and 80 gms protein.  Hunger is well controlled.  Current exercise:  walk for 20 minutes 3 times week  Interim History: Laura Brown continues to do very well with weight loss although she feels it is very slow.  She is very close to losing 100 pounds over the course of her time with our program. She reports that she meets her calorie goals about 5 out of 7 days/week and meets her protein goals less than that.  She is not finding meat appealing recently so she has been looking for other foods to increase her protein intake.  When she does not meet her calorie goal she is usually under 1200/day rather than over 1400/day.  Assessment/Plan:  1. Polyphagia Hunger is very well controlled-sometimes to the point that she does not feel like eating. Medication(s): Wegovy 1.7 mg weekly.  Continues to have occasional mild nausea with the [facility].  Sometimes she takes the injection every 8 or 9 days rather than every 7 days.  Plan: Refill Wegovy 1.7 mg weekly. She may stretch out time between injections if she desires.  2. Iron deficiency anemia Anemia is stable.  Last hemoglobin was 14.3 and ferritin was 33.3.  She has  history of sleeve gastrectomy which is likely a contributing factor. Iron supplementation: Iron 325 mg by mouth daily a multivitamin with iron. Lab Results  Component Value Date   WBC 4.6 04/04/2021   HGB 14.3 04/04/2021   HCT 43 04/04/2021   MCV 80 12/27/2020   PLT 281 04/04/2021   Lab Results  Component Value Date   FERRITIN 33.3 04/04/2021     Plan: Continue supplementation at current dose Continue multivitamin with iron daily.   3. Obesity: Current BMI 32.46 Laura Brown is currently in the action stage of change. As such, her goal is to continue with weight loss efforts.  She has agreed to keeping a food journal and adhering to recommended goals of 1200-1400 calories and 80 gms protein. [name] Kitchen   Exercise goals: as is Increase calories to minimum of 1200/day Discussed options for non meat protein rich foods.  Behavioral modification strategies: increasing lean protein intake, meal planning and cooking strategies, and better snacking choices.  Laura Brown has agreed to follow-up with our clinic in 4 weeks.   No orders of the defined types were placed in this encounter.   Medications Discontinued During This Encounter  Medication Reason   buPROPion (WELLBUTRIN SR) 200 MG 12 hr tablet Completed Course   cyclobenzaprine (FLEXERIL) 10 MG tablet Completed Course   naproxen (NAPROSYN) 500 MG tablet Completed Course   Semaglutide-Weight Management (WEGOVY) 1.7 MG/0.75ML SOAJ Reorder     Meds ordered this encounter  Medications   Semaglutide-Weight Management (WEGOVY) 1.7 MG/0.75ML SOAJ    Sig: Inject 1.7  mg into the skin once a week.    Dispense:  3 mL    Refill:  0    Order Specific Question:   Supervising Provider    Answer:   Quillian Quince D [[id number]]      Objective:   VITALS: Per patient if applicable, see vitals. GENERAL: Alert and in no acute distress. CARDIOPULMONARY: No increased WOB. Speaking in clear sentences.  PSYCH: Pleasant and cooperative. Speech normal rate and  rhythm. Affect is appropriate. Insight and judgement are appropriate. Attention is focused, linear, and appropriate.  NEURO: Oriented as arrived to appointment on time with no prompting.   Lab Results  Component Value Date   CREATININE 0.8 04/04/2021   BUN 8 04/04/2021   NA 144 04/04/2021   K 4.6 04/04/2021   CL 105 04/04/2021   CO2 33 (A) 04/04/2021   Lab Results  Component Value Date   ALT 7 04/04/2021   AST 13 04/04/2021   ALKPHOS 63 04/04/2021   BILITOT 0.2 12/27/2020   Lab Results  Component Value Date   HGBA1C 5.2 12/27/2020   HGBA1C 4.6 (L) 07/15/2019   HGBA1C 4.6 (L) 03/25/2019   HGBA1C 4.7 (L) 11/26/2018   HGBA1C 4.9 11/07/2017   Lab Results  Component Value Date   INSULIN 3.0 12/27/2020   INSULIN 8.6 07/15/2019   INSULIN 9.7 03/25/2019   INSULIN 6.1 11/26/2018   INSULIN 6.7 11/07/2017   Lab Results  Component Value Date   TSH 0.94 01/06/2021   Lab Results  Component Value Date   CHOL 202 (A) 01/06/2021   HDL 82 (A) 01/06/2021   LDLCALC 109 01/06/2021   TRIG 57 01/06/2021   Lab Results  Component Value Date   WBC 4.6 04/04/2021   HGB 14.3 04/04/2021   HCT 43 04/04/2021   MCV 80 12/27/2020   PLT 281 04/04/2021   Lab Results  Component Value Date   FERRITIN 33.3 04/04/2021   Lab Results  Component Value Date   VD25OH 78.8 04/04/2021   VD25OH 49.3 12/27/2020   VD25OH 41.0 07/15/2019    Attestation Statements:   Reviewed by clinician on day of visit: allergies, medications, problem list, medical history, surgical history, family history, social history, and previous encounter notes.

## 2021-08-18 ENCOUNTER — Other Ambulatory Visit (HOSPITAL_BASED_OUTPATIENT_CLINIC_OR_DEPARTMENT_OTHER): Payer: Self-pay

## 2021-09-01 ENCOUNTER — Other Ambulatory Visit (HOSPITAL_COMMUNITY): Payer: Self-pay

## 2021-09-04 ENCOUNTER — Other Ambulatory Visit (HOSPITAL_BASED_OUTPATIENT_CLINIC_OR_DEPARTMENT_OTHER): Payer: Self-pay

## 2021-09-06 ENCOUNTER — Encounter (INDEPENDENT_AMBULATORY_CARE_PROVIDER_SITE_OTHER): Payer: Self-pay

## 2021-09-11 ENCOUNTER — Other Ambulatory Visit (HOSPITAL_COMMUNITY): Payer: Self-pay

## 2021-09-12 NOTE — Progress Notes (Unsigned)
TeleHealth Visit:  This visit was completed with telemedicine (audio/video) technology. Laura Brown has verbally consented to this TeleHealth visit. The patient is located at home, the provider is located at home. The participants in this visit include the listed provider and patient. The visit was conducted today via MyChart video.  OBESITY Laura Brown is here to discuss her progress with her obesity treatment plan along with follow-up of her obesity related diagnoses.   Today's visit was # 50 Starting weight: 296 lbs Starting date: 11/07/2017 Weight reported at last virtual office visit: 201 lbs Today's reported weight: 199 lbs  Total weight loss: 97  lbs Weight change since last visit: -2 Goal weight 185 lbs.  Nutrition Plan: keeping a food journal and adhering to recommended goals of 1200-1400 calories and 80 gms protein.  Hunger is well controlled. Cravings are well controlled.  Current exercise:  walking for 20 minutes 3-4 times week  Interim History: Laura Brown still struggling to get in enough calories and protein.  Calories average 700 to 800/day and protein averages 50 to 80 g/day.  She does have intermittent nausea from the [facility]. She is unable to eat chicken-she is very tired of it and she cannot stomach it.  Assessment/Plan:  1.  Nausea Has intermittent nausea from the [facility] despite being on it for several months now.  She feels this is made worse by having an empty stomach.  Plan: She will try very hard to make sure she has 3 meals per day, especially something for breakfast. Refill Zofran 8 mg tablet every 8 hours as needed for nausea  2.  Polyphagia Her appetite is very well controlled with Wegovy 1.7 but she struggles to get in enough calories and protein.  Reducing the dose is not an option because of availability issues.  Plan: Refill Wegovy 1.7 mg weekly. She will eat regular meals. Consider switching to [facility] for easier titration if she is unable to increase  her calories and protein.  3. Obesity: Current BMI 32.13 Laura Brown is currently in the action stage of change. As such, her goal is to continue with weight loss efforts.  She has agreed to keeping a food journal and adhering to recommended goals of 1200-1400 calories and 80 gms protein.   1.  She will work very hard to increase calories to 1200/day and protein to 80 g/day. 2.  We discussed several meal options that are appealing to her including options with beef.  Exercise goals: as is  Behavioral modification strategies: increasing lean protein intake and meal planning and cooking strategies.  Laura Brown has agreed to follow-up with our clinic in 4 weeks.   No orders of the defined types were placed in this encounter.   Medications Discontinued During This Encounter  Medication Reason   ondansetron (ZOFRAN) 8 MG tablet Reorder   Semaglutide-Weight Management (WEGOVY) 1.7 MG/0.75ML SOAJ Reorder     Meds ordered this encounter  Medications   ondansetron (ZOFRAN) 8 MG tablet    Sig: Take 1 tablet (8 mg total) by mouth every 8 (eight) hours as needed for nausea or vomiting.    Dispense:  20 tablet    Refill:  0    Order Specific Question:   Supervising Provider    Answer:   Wilder Glade [[id number]]   Semaglutide-Weight Management (WEGOVY) 1.7 MG/0.75ML SOAJ    Sig: Inject 1.7 mg into the skin once a week.    Dispense:  3 mL    Refill:  0    Order Specific  Question:   Supervising Provider    Answer:   Quillian Quince D [[id number]]      Objective:   VITALS: Per patient if applicable, see vitals. GENERAL: Alert and in no acute distress. CARDIOPULMONARY: No increased WOB. Speaking in clear sentences.  PSYCH: Pleasant and cooperative. Speech normal rate and rhythm. Affect is appropriate. Insight and judgement are appropriate. Attention is focused, linear, and appropriate.  NEURO: Oriented as arrived to appointment on time with no prompting.   Lab Results  Component Value Date    CREATININE 0.8 04/04/2021   BUN 8 04/04/2021   NA 144 04/04/2021   K 4.6 04/04/2021   CL 105 04/04/2021   CO2 33 (A) 04/04/2021   Lab Results  Component Value Date   ALT 7 04/04/2021   AST 13 04/04/2021   ALKPHOS 63 04/04/2021   BILITOT 0.2 12/27/2020   Lab Results  Component Value Date   HGBA1C 5.2 12/27/2020   HGBA1C 4.6 (L) 07/15/2019   HGBA1C 4.6 (L) 03/25/2019   HGBA1C 4.7 (L) 11/26/2018   HGBA1C 4.9 11/07/2017   Lab Results  Component Value Date   INSULIN 3.0 12/27/2020   INSULIN 8.6 07/15/2019   INSULIN 9.7 03/25/2019   INSULIN 6.1 11/26/2018   INSULIN 6.7 11/07/2017   Lab Results  Component Value Date   TSH 0.94 01/06/2021   Lab Results  Component Value Date   CHOL 202 (A) 01/06/2021   HDL 82 (A) 01/06/2021   LDLCALC 109 01/06/2021   TRIG 57 01/06/2021   Lab Results  Component Value Date   WBC 4.6 04/04/2021   HGB 14.3 04/04/2021   HCT 43 04/04/2021   MCV 80 12/27/2020   PLT 281 04/04/2021   Lab Results  Component Value Date   FERRITIN 33.3 04/04/2021   Lab Results  Component Value Date   VD25OH 78.8 04/04/2021   VD25OH 49.3 12/27/2020   VD25OH 41.0 07/15/2019    Attestation Statements:   Reviewed by clinician on day of visit: allergies, medications, problem list, medical history, surgical history, family history, social history, and previous encounter notes.

## 2021-09-13 ENCOUNTER — Other Ambulatory Visit: Payer: Self-pay | Admitting: Family Medicine

## 2021-09-13 ENCOUNTER — Telehealth (INDEPENDENT_AMBULATORY_CARE_PROVIDER_SITE_OTHER): Payer: No Typology Code available for payment source | Admitting: Family Medicine

## 2021-09-13 ENCOUNTER — Encounter (INDEPENDENT_AMBULATORY_CARE_PROVIDER_SITE_OTHER): Payer: Self-pay | Admitting: Family Medicine

## 2021-09-13 ENCOUNTER — Other Ambulatory Visit (HOSPITAL_BASED_OUTPATIENT_CLINIC_OR_DEPARTMENT_OTHER): Payer: Self-pay

## 2021-09-13 VITALS — Ht 66.0 in | Wt 199.0 lb

## 2021-09-13 DIAGNOSIS — R110 Nausea: Secondary | ICD-10-CM

## 2021-09-13 DIAGNOSIS — Z6832 Body mass index (BMI) 32.0-32.9, adult: Secondary | ICD-10-CM

## 2021-09-13 DIAGNOSIS — I75022 Atheroembolism of left lower extremity: Secondary | ICD-10-CM

## 2021-09-13 DIAGNOSIS — E669 Obesity, unspecified: Secondary | ICD-10-CM | POA: Diagnosis not present

## 2021-09-13 DIAGNOSIS — R632 Polyphagia: Secondary | ICD-10-CM

## 2021-09-13 MED ORDER — WEGOVY 1.7 MG/0.75ML ~~LOC~~ SOAJ
1.7000 mg | SUBCUTANEOUS | 0 refills | Status: DC
  Filled 2021-09-13 – 2021-10-05 (×2): qty 3, 28d supply, fill #0

## 2021-09-13 MED ORDER — PREDNISONE 50 MG PO TABS
50.0000 mg | ORAL_TABLET | Freq: Every day | ORAL | 0 refills | Status: DC
  Filled 2021-09-13: qty 5, 5d supply, fill #0

## 2021-09-13 MED ORDER — CLOBETASOL PROPIONATE 0.05 % EX OINT
TOPICAL_OINTMENT | CUTANEOUS | 2 refills | Status: AC
  Filled 2021-09-13: qty 30, 10d supply, fill #0

## 2021-09-13 MED ORDER — ONDANSETRON HCL 8 MG PO TABS
8.0000 mg | ORAL_TABLET | Freq: Three times a day (TID) | ORAL | 0 refills | Status: DC | PRN
  Filled 2021-09-13: qty 20, 7d supply, fill #0

## 2021-09-14 ENCOUNTER — Ambulatory Visit (INDEPENDENT_AMBULATORY_CARE_PROVIDER_SITE_OTHER): Payer: No Typology Code available for payment source

## 2021-09-14 DIAGNOSIS — I75023 Atheroembolism of bilateral lower extremities: Secondary | ICD-10-CM | POA: Diagnosis not present

## 2021-09-14 DIAGNOSIS — I75022 Atheroembolism of left lower extremity: Secondary | ICD-10-CM

## 2021-09-26 ENCOUNTER — Other Ambulatory Visit (HOSPITAL_BASED_OUTPATIENT_CLINIC_OR_DEPARTMENT_OTHER): Payer: Self-pay

## 2021-09-27 ENCOUNTER — Other Ambulatory Visit (HOSPITAL_BASED_OUTPATIENT_CLINIC_OR_DEPARTMENT_OTHER): Payer: Self-pay

## 2021-10-03 ENCOUNTER — Other Ambulatory Visit (HOSPITAL_COMMUNITY): Payer: Self-pay

## 2021-10-05 ENCOUNTER — Other Ambulatory Visit (HOSPITAL_BASED_OUTPATIENT_CLINIC_OR_DEPARTMENT_OTHER): Payer: Self-pay

## 2021-10-09 ENCOUNTER — Other Ambulatory Visit (HOSPITAL_COMMUNITY): Payer: Self-pay

## 2021-10-10 ENCOUNTER — Telehealth (INDEPENDENT_AMBULATORY_CARE_PROVIDER_SITE_OTHER): Payer: No Typology Code available for payment source | Admitting: Family Medicine

## 2021-10-18 ENCOUNTER — Other Ambulatory Visit (HOSPITAL_BASED_OUTPATIENT_CLINIC_OR_DEPARTMENT_OTHER): Payer: Self-pay

## 2021-10-18 ENCOUNTER — Telehealth (INDEPENDENT_AMBULATORY_CARE_PROVIDER_SITE_OTHER): Payer: No Typology Code available for payment source | Admitting: Family Medicine

## 2021-10-19 ENCOUNTER — Other Ambulatory Visit (HOSPITAL_BASED_OUTPATIENT_CLINIC_OR_DEPARTMENT_OTHER): Payer: Self-pay

## 2021-10-20 ENCOUNTER — Other Ambulatory Visit (HOSPITAL_BASED_OUTPATIENT_CLINIC_OR_DEPARTMENT_OTHER): Payer: Self-pay

## 2021-10-20 MED ORDER — DULOXETINE HCL 30 MG PO CPEP
ORAL_CAPSULE | ORAL | 0 refills | Status: AC
  Filled 2021-10-20: qty 90, 90d supply, fill #0

## 2021-10-20 MED ORDER — DULOXETINE HCL 30 MG PO CPEP
30.0000 mg | ORAL_CAPSULE | Freq: Every day | ORAL | 2 refills | Status: AC
  Filled 2021-10-20: qty 90, 90d supply, fill #0

## 2021-10-20 MED ORDER — MUPIROCIN 2 % EX OINT
1.0000 | TOPICAL_OINTMENT | Freq: Two times a day (BID) | CUTANEOUS | 2 refills | Status: AC | PRN
  Filled 2021-10-20: qty 22, 7d supply, fill #0
  Filled 2021-10-20: qty 22, 10d supply, fill #0

## 2021-10-26 NOTE — Progress Notes (Signed)
TeleHealth Visit:  This visit was completed with telemedicine (audio/video) technology. [name] has verbally consented to this TeleHealth visit. The patient is located at home, the provider is located at home. The participants in this visit include the listed provider and patient. The visit was conducted today via MyChart video.  OBESITY Laura Brown is here to discuss her progress with her obesity treatment plan along with follow-up of her obesity related diagnoses.    Today's visit was # 17  Starting weight: 296 lbs Starting date: 11/07/2017 Weight reported at last virtual office visit: 199 lbs on 09/13/21 Today's reported weight: 197 lbs  Total weight loss: 99 lbs Weight change since last visit: -2  Goal weight 185 lbs  Nutrition Plan: keeping a food journal and adhering to recommended goals of 1200-1400 calories and 80 gms protein. .   Current exercise: walking for 30 minutes 3 times week  Interim History: She is a bit disappointed not to meet her goal of 100 pounds of weight loss today but happy overall with her progress.  She is lost 99 pounds since October 2019! She is doing much better with meeting protein and calorie goals and estimates that she meets these about 75% of the time.  She is generally low on calories and protein. Appetite very well controlled with Wegovy 1.7 mg weekly.  Over the next 3 months she will be having several events with work and personally-going to the state fair, trunk or treat at work, Social research officer, government.  She plans on allowing herself to have some of the foods that she wants within reason.  She has finally bought some new clothes that fit better.  Assessment/Plan:  1. Polyphagia Medication(s): Wegovy 1.7 mg weekly.  She is having nausea less frequently and rarely needs to use Zofran. Effects of medication:  well controlled. Cravings are well controlled.   Plan: Refill Wegovy 1.7 mg weekly  2. Vitamin D Deficiency Vitamin D is at goal of 50.  She is on  OTC vitamin D 5000 IU daily. Lab Results  Component Value Date   VD25OH 78.8 04/04/2021   VD25OH 49.3 12/27/2020   VD25OH 41.0 07/15/2019    Plan: Continue OTC vitamin D 5000 IU daily.   3. Obesity: Current BMI 31.81 [name] is currently in the action stage of change. As such, her goal is to continue with weight loss efforts.  She has agreed to  keeping a food journal and adhering to recommended goals of 1200-1400 calories and 80 gms protein. . .   Exercise goals: Increase exercise to 150 minutes of cardio weekly.  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  Behavioral modification strategies: increasing lean protein intake, celebration eating strategies, and planning for success.  [name] has agreed to follow-up with our clinic in 5 weeks.   No orders of the defined types were placed in this encounter.   Medications Discontinued During This Encounter  Medication Reason   Semaglutide-Weight Management (WEGOVY) 1.7 MG/0.75ML SOAJ Reorder     Meds ordered this encounter  Medications   Semaglutide-Weight Management (WEGOVY) 1.7 MG/0.75ML SOAJ    Sig: Inject 1.7 mg into the skin once a week.    Dispense:  3 mL    Refill:  0    Order Specific Question:   Supervising Provider    Answer:   Dell Ponto [[id number]]      Objective:   VITALS: Per patient if applicable, see vitals. GENERAL: Alert and in  no acute distress. CARDIOPULMONARY: No increased WOB. Speaking in clear sentences.  PSYCH: Pleasant and cooperative. Speech normal rate and rhythm. Affect is appropriate. Insight and judgement are appropriate. Attention is focused, linear, and appropriate.  NEURO: Oriented as arrived to appointment on time with no prompting.   Lab Results  Component Value Date   CREATININE 0.8 04/04/2021   BUN 8 04/04/2021   NA 144 04/04/2021   K 4.6 04/04/2021   CL 105 04/04/2021   CO2 33 (A) 04/04/2021   Lab Results  Component  Value Date   ALT 7 04/04/2021   AST 13 04/04/2021   ALKPHOS 63 04/04/2021   BILITOT 0.2 12/27/2020   Lab Results  Component Value Date   HGBA1C 5.2 12/27/2020   HGBA1C 4.6 (L) 07/15/2019   HGBA1C 4.6 (L) 03/25/2019   HGBA1C 4.7 (L) 11/26/2018   HGBA1C 4.9 11/07/2017   Lab Results  Component Value Date   INSULIN 3.0 12/27/2020   INSULIN 8.6 07/15/2019   INSULIN 9.7 03/25/2019   INSULIN 6.1 11/26/2018   INSULIN 6.7 11/07/2017   Lab Results  Component Value Date   TSH 0.94 01/06/2021   Lab Results  Component Value Date   CHOL 202 (A) 01/06/2021   HDL 82 (A) 01/06/2021   LDLCALC 109 01/06/2021   TRIG 57 01/06/2021   Lab Results  Component Value Date   WBC 4.6 04/04/2021   HGB 14.3 04/04/2021   HCT 43 04/04/2021   MCV 80 12/27/2020   PLT 281 04/04/2021   Lab Results  Component Value Date   FERRITIN 33.3 04/04/2021   Lab Results  Component Value Date   VD25OH 78.8 04/04/2021   VD25OH 49.3 12/27/2020   VD25OH 41.0 07/15/2019    Attestation Statements:   Reviewed by clinician on day of visit: allergies, medications, problem list, medical history, surgical history, family history, social history, and previous encounter notes.

## 2021-10-30 ENCOUNTER — Other Ambulatory Visit (HOSPITAL_BASED_OUTPATIENT_CLINIC_OR_DEPARTMENT_OTHER): Payer: Self-pay

## 2021-10-30 ENCOUNTER — Telehealth (INDEPENDENT_AMBULATORY_CARE_PROVIDER_SITE_OTHER): Payer: No Typology Code available for payment source | Admitting: Family Medicine

## 2021-10-30 ENCOUNTER — Encounter (INDEPENDENT_AMBULATORY_CARE_PROVIDER_SITE_OTHER): Payer: Self-pay | Admitting: Family Medicine

## 2021-10-30 ENCOUNTER — Other Ambulatory Visit (HOSPITAL_COMMUNITY): Payer: Self-pay

## 2021-10-30 VITALS — Ht 66.0 in | Wt 197.0 lb

## 2021-10-30 DIAGNOSIS — R632 Polyphagia: Secondary | ICD-10-CM | POA: Diagnosis not present

## 2021-10-30 DIAGNOSIS — E669 Obesity, unspecified: Secondary | ICD-10-CM | POA: Diagnosis not present

## 2021-10-30 DIAGNOSIS — Z6831 Body mass index (BMI) 31.0-31.9, adult: Secondary | ICD-10-CM

## 2021-10-30 DIAGNOSIS — E559 Vitamin D deficiency, unspecified: Secondary | ICD-10-CM

## 2021-10-30 MED ORDER — WEGOVY 1.7 MG/0.75ML ~~LOC~~ SOAJ
1.7000 mg | SUBCUTANEOUS | 0 refills | Status: DC
  Filled 2021-10-30: qty 3, 28d supply, fill #0

## 2021-11-09 ENCOUNTER — Ambulatory Visit: Payer: No Typology Code available for payment source | Attending: Family Medicine | Admitting: Pharmacist

## 2021-11-09 DIAGNOSIS — Z79899 Other long term (current) drug therapy: Secondary | ICD-10-CM

## 2021-11-09 NOTE — Progress Notes (Signed)
  S: Patient presents for review of their specialty medication therapy.  Patient is currently taking Otezla for psoriasis. Patient is managed by Dr. Ernie Hew for this.   Adherence: confirms   Efficacy: confirms that the medication is working well for her.  Dosing:  Active psoriatic arthritis or plaque psoriasis (moderate to severe): Oral: 30 mg BID  Current adverse effects: Headache: none  GI upset: none  Weight loss: none  Neuropsychiatric effects: none   O:     Lab Results  Component Value Date   WBC 4.6 04/04/2021   HGB 14.3 04/04/2021   HCT 43 04/04/2021   MCV 80 12/27/2020   PLT 281 04/04/2021      Chemistry      Component Value Date/Time   NA 144 04/04/2021 0000   NA 143 03/22/2011 1014   K 4.6 04/04/2021 0000   K 3.9 03/22/2011 1014   CL 105 04/04/2021 0000   CL 107 03/22/2011 1014   CO2 33 (A) 04/04/2021 0000   CO2 26 03/22/2011 1014   BUN 8 04/04/2021 0000   BUN 14 03/22/2011 1014   CREATININE 0.8 04/04/2021 0000   CREATININE 0.76 12/27/2020 0927   CREATININE 0.73 03/22/2011 1014   GLU 80 04/04/2021 0000      Component Value Date/Time   CALCIUM 9.4 04/04/2021 0000   CALCIUM 8.7 03/22/2011 1014   ALKPHOS 63 04/04/2021 0000   ALKPHOS 54 03/22/2011 1014   AST 13 04/04/2021 0000   AST 21 03/22/2011 1014   ALT 7 04/04/2021 0000   ALT 15 03/22/2011 1014   BILITOT 0.2 12/27/2020 0927   BILITOT 0.5 03/22/2011 1014       A/P: 1. Medication review: patient is currently on [place] for psoriasis and is tolerating it well. Reviewed the medication including the following: apremilast inhibits phosphodiesterase 4 (PDE4) specific for cyclic adenosine monophosphate (cAMP) which results in increased intracellular cAMP levels and regulation of numerous inflammatory mediators (eg, decreased expression of nitric oxide synthase, TNF-alpha, and interleukin [IL]-23, as well as increased IL-10. Patient educated on purpose, proper use and potential adverse effects of Otezla.  Possible adverse effects include weight loss, GI upset, headache, and mood changes. Renal function should be routinely monitored. Administer without regard to food. Do not crush, chew, or split tablets. No recommendations for any changes at this time.   Benard Halsted, PharmD, [place], [place] [phone]

## 2021-11-10 ENCOUNTER — Other Ambulatory Visit (HOSPITAL_COMMUNITY): Payer: Self-pay

## 2021-11-23 ENCOUNTER — Other Ambulatory Visit (HOSPITAL_COMMUNITY): Payer: Self-pay

## 2021-11-30 ENCOUNTER — Other Ambulatory Visit (HOSPITAL_COMMUNITY): Payer: Self-pay

## 2021-12-04 NOTE — Progress Notes (Unsigned)
TeleHealth Visit:  This visit was completed with telemedicine (audio/video) technology. Quamesha has verbally consented to this TeleHealth visit. The patient is located at home, the provider is located at home. The participants in this visit include the listed provider and patient. The visit was conducted today via MyChart video.  OBESITY Lizbett is here to discuss her progress with her obesity treatment plan along with follow-up of her obesity related diagnoses.    Today's visit was # 75  Starting weight: 296 lbs Starting date: 11/07/2017 Weight reported at last virtual office visit: 197 lbs on 10/30/21 Today's reported weight: 197 lbs  Total weight loss: 99 lbs Weight change since last visit: 0  Nutrition Plan: keeping a food journal and adhering to recommended goals of 1200-1400 calories and 80 gms protein .   Current exercise: walking for 30 minutes 3 times week   Interim History: Meganne has give herself a break over the last month.  She has not been journaling and she has allowed herself some treats over [place].  She has maintained her weight.  She would like to get back to what she calls "lean and green". She does not feel that the holidays will pose a problem for her as far as the meal plan. She reports water intake could be better. Goal is 185 lbs. Assessment/Plan:  1. Polyphagia Appetite well controlled with Wegovy 1.7 mg weekly.  Tolerating well.  Plan: Refill Wegovy 1.7 mg weekly.   2. Eating depression/emotional eating Cravings are well controlled with Wegovy.  Mood is stable.  On Cymbalta per PCP.  Plan: Continue Cymbalta and Wegovy.  3. Obesity: Current BMI 31.81 Shamirah is currently in the action stage of change. As such, her goal is to continue with weight loss efforts.  She has agreed to following a lower carbohydrate, vegetable and lean protein rich diet plan.   Exercise goals: as is  Low-carb plan sent via MyChart.  Behavioral modification  strategies: increasing lean protein intake, decreasing simple carbohydrates, increasing vegetables, increasing water intake, meal planning and cooking strategies, and planning for success.  Jaaliyah has agreed to follow-up with our clinic in 4 weeks.   No orders of the defined types were placed in this encounter.   Medications Discontinued During This Encounter  Medication Reason   Semaglutide-Weight Management (WEGOVY) 1.7 MG/0.75ML SOAJ Reorder   predniSONE (DELTASONE) 50 MG tablet Completed Course     Meds ordered this encounter  Medications   Semaglutide-Weight Management (WEGOVY) 1.7 MG/0.75ML SOAJ    Sig: Inject 1.7 mg into the skin once a week.    Dispense:  3 mL    Refill:  0    Order Specific Question:   Supervising Provider    Answer:   Dell Ponto [[id number]]      Objective:   VITALS: Per patient if applicable, see vitals. GENERAL: Alert and in no acute distress. CARDIOPULMONARY: No increased WOB. Speaking in clear sentences.  PSYCH: Pleasant and cooperative. Speech normal rate and rhythm. Affect is appropriate. Insight and judgement are appropriate. Attention is focused, linear, and appropriate.  NEURO: Oriented as arrived to appointment on time with no prompting.   Lab Results  Component Value Date   CREATININE 0.8 04/04/2021   BUN 8 04/04/2021   NA 144 04/04/2021   K 4.6 04/04/2021   CL 105 04/04/2021   CO2 33 (A) 04/04/2021   Lab Results  Component Value Date   ALT 7 04/04/2021   AST 13 04/04/2021   ALKPHOS 63 04/04/2021  BILITOT 0.2 12/27/2020   Lab Results  Component Value Date   HGBA1C 5.2 12/27/2020   HGBA1C 4.6 (L) 07/15/2019   HGBA1C 4.6 (L) 03/25/2019   HGBA1C 4.7 (L) 11/26/2018   HGBA1C 4.9 11/07/2017   Lab Results  Component Value Date   INSULIN 3.0 12/27/2020   INSULIN 8.6 07/15/2019   INSULIN 9.7 03/25/2019   INSULIN 6.1 11/26/2018   INSULIN 6.7 11/07/2017   Lab Results  Component Value Date   TSH 0.94 01/06/2021   Lab Results   Component Value Date   CHOL 202 (A) 01/06/2021   HDL 82 (A) 01/06/2021   LDLCALC 109 01/06/2021   TRIG 57 01/06/2021   Lab Results  Component Value Date   WBC 4.6 04/04/2021   HGB 14.3 04/04/2021   HCT 43 04/04/2021   MCV 80 12/27/2020   PLT 281 04/04/2021   Lab Results  Component Value Date   FERRITIN 33.3 04/04/2021   Lab Results  Component Value Date   VD25OH 78.8 04/04/2021   VD25OH 49.3 12/27/2020   VD25OH 41.0 07/15/2019    Attestation Statements:   Reviewed by clinician on day of visit: allergies, medications, problem list, medical history, surgical history, family history, social history, and previous encounter notes.

## 2021-12-05 ENCOUNTER — Other Ambulatory Visit (HOSPITAL_BASED_OUTPATIENT_CLINIC_OR_DEPARTMENT_OTHER): Payer: Self-pay

## 2021-12-05 ENCOUNTER — Encounter (INDEPENDENT_AMBULATORY_CARE_PROVIDER_SITE_OTHER): Payer: Self-pay | Admitting: Family Medicine

## 2021-12-05 ENCOUNTER — Telehealth (INDEPENDENT_AMBULATORY_CARE_PROVIDER_SITE_OTHER): Payer: No Typology Code available for payment source | Admitting: Family Medicine

## 2021-12-05 VITALS — Ht 66.0 in | Wt 197.0 lb

## 2021-12-05 DIAGNOSIS — E669 Obesity, unspecified: Secondary | ICD-10-CM | POA: Diagnosis not present

## 2021-12-05 DIAGNOSIS — F3289 Other specified depressive episodes: Secondary | ICD-10-CM

## 2021-12-05 DIAGNOSIS — R632 Polyphagia: Secondary | ICD-10-CM | POA: Diagnosis not present

## 2021-12-05 DIAGNOSIS — Z6831 Body mass index (BMI) 31.0-31.9, adult: Secondary | ICD-10-CM | POA: Diagnosis not present

## 2021-12-05 MED ORDER — WEGOVY 1.7 MG/0.75ML ~~LOC~~ SOAJ
1.7000 mg | SUBCUTANEOUS | 0 refills | Status: DC
  Filled 2021-12-05: qty 3, 28d supply, fill #0

## 2021-12-08 ENCOUNTER — Other Ambulatory Visit (HOSPITAL_COMMUNITY): Payer: Self-pay

## 2022-01-01 NOTE — Progress Notes (Deleted)
TeleHealth Visit:  This visit was completed with telemedicine (audio/video) technology. [place] has verbally consented to this TeleHealth visit. The patient is located at home, the provider is located at home. The participants in this visit include the listed provider and patient. The visit was conducted today via MyChart video.  OBESITY Laura Brown is here to discuss her progress with her obesity treatment plan along with follow-up of her obesity related diagnoses.    Today's visit was # 27  Starting weight: 296 lbs Starting date: 11/07/2017 Weight reported at last virtual office visit: 197 lbs on 12/05/21 Today's reported weight: *** lbs No weight reported. Weight at clinic on ***: *** lbs Total weight loss: *** Weight change since last visit: ***   Nutrition Plan: low carb   Current exercise: *** walking for 30 minutes 3 times week   Interim History: ***  Assessment/Plan:  1. ***  2. ***  3. ***  Obesity: Current BMI *** Vara {CHL AMB IS/IS NOT:210130109} currently in the action stage of change. As such, her goal is to {MWMwtloss#1:210800005}.  She has agreed to {MWMwtlossportion/plan2:23431}.   Exercise goals: {MWM EXERCISE RECS:23473}  Behavioral modification strategies: {MWMwtlossdietstrategies3:23432}.  Kirston has agreed to follow-up with our clinic in {NUMBER 1-10:22536} weeks.   No orders of the defined types were placed in this encounter.   There are no discontinued medications.   No orders of the defined types were placed in this encounter.     Objective:   VITALS: Per patient if applicable, see vitals. GENERAL: Alert and in no acute distress. CARDIOPULMONARY: No increased WOB. Speaking in clear sentences.  PSYCH: Pleasant and cooperative. Speech normal rate and rhythm. Affect is appropriate. Insight and judgement are appropriate. Attention is focused, linear, and appropriate.  NEURO: Oriented as arrived to appointment on time with no prompting.    Lab Results  Component Value Date   CREATININE 0.8 04/04/2021   BUN 8 04/04/2021   NA 144 04/04/2021   K 4.6 04/04/2021   CL 105 04/04/2021   CO2 33 (A) 04/04/2021   Lab Results  Component Value Date   ALT 7 04/04/2021   AST 13 04/04/2021   ALKPHOS 63 04/04/2021   BILITOT 0.2 12/27/2020   Lab Results  Component Value Date   HGBA1C 5.2 12/27/2020   HGBA1C 4.6 (L) 07/15/2019   HGBA1C 4.6 (L) 03/25/2019   HGBA1C 4.7 (L) 11/26/2018   HGBA1C 4.9 11/07/2017   Lab Results  Component Value Date   INSULIN 3.0 12/27/2020   INSULIN 8.6 07/15/2019   INSULIN 9.7 03/25/2019   INSULIN 6.1 11/26/2018   INSULIN 6.7 11/07/2017   Lab Results  Component Value Date   TSH 0.94 01/06/2021   Lab Results  Component Value Date   CHOL 202 (A) 01/06/2021   HDL 82 (A) 01/06/2021   LDLCALC 109 01/06/2021   TRIG 57 01/06/2021   Lab Results  Component Value Date   WBC 4.6 04/04/2021   HGB 14.3 04/04/2021   HCT 43 04/04/2021   MCV 80 12/27/2020   PLT 281 04/04/2021   Lab Results  Component Value Date   FERRITIN 33.3 04/04/2021   Lab Results  Component Value Date   VD25OH 78.8 04/04/2021   VD25OH 49.3 12/27/2020   VD25OH 41.0 07/15/2019    Attestation Statements:   Reviewed by clinician on day of visit: allergies, medications, problem list, medical history, surgical history, family history, social history, and previous encounter notes.  ***(delete if time-based billing not used) Time spent on  visit including the items listed below was *** minutes.  -preparing to see the patient (e.g., review of tests, history, previous notes) -obtaining and/or reviewing separately obtained history -counseling and educating the patient/family/caregiver -documenting clinical information in the electronic or other health record -ordering medications, tests, or procedures -independently interpreting results and communicating results to the patient/ family/caregiver -referring and communicating  with other health care professionals  -care coordination

## 2022-01-02 ENCOUNTER — Other Ambulatory Visit (HOSPITAL_COMMUNITY): Payer: Self-pay

## 2022-01-02 ENCOUNTER — Telehealth (INDEPENDENT_AMBULATORY_CARE_PROVIDER_SITE_OTHER): Payer: No Typology Code available for payment source | Admitting: Family Medicine

## 2022-01-04 ENCOUNTER — Other Ambulatory Visit (HOSPITAL_COMMUNITY): Payer: Self-pay

## 2022-01-05 ENCOUNTER — Other Ambulatory Visit (HOSPITAL_COMMUNITY): Payer: Self-pay

## 2022-01-08 ENCOUNTER — Other Ambulatory Visit (HOSPITAL_COMMUNITY): Payer: Self-pay

## 2022-01-11 ENCOUNTER — Other Ambulatory Visit (HOSPITAL_COMMUNITY): Payer: Self-pay

## 2022-01-12 ENCOUNTER — Other Ambulatory Visit (HOSPITAL_BASED_OUTPATIENT_CLINIC_OR_DEPARTMENT_OTHER): Payer: Self-pay

## 2022-01-12 ENCOUNTER — Other Ambulatory Visit (INDEPENDENT_AMBULATORY_CARE_PROVIDER_SITE_OTHER): Payer: Self-pay | Admitting: Family Medicine

## 2022-01-12 DIAGNOSIS — R632 Polyphagia: Secondary | ICD-10-CM

## 2022-01-15 ENCOUNTER — Other Ambulatory Visit: Payer: Self-pay

## 2022-01-15 ENCOUNTER — Other Ambulatory Visit (HOSPITAL_BASED_OUTPATIENT_CLINIC_OR_DEPARTMENT_OTHER): Payer: Self-pay

## 2022-01-17 ENCOUNTER — Other Ambulatory Visit (HOSPITAL_COMMUNITY): Payer: Self-pay

## 2022-01-19 ENCOUNTER — Other Ambulatory Visit (HOSPITAL_COMMUNITY): Payer: Self-pay

## 2022-01-24 ENCOUNTER — Other Ambulatory Visit (HOSPITAL_COMMUNITY): Payer: Self-pay

## 2022-01-26 ENCOUNTER — Other Ambulatory Visit (HOSPITAL_COMMUNITY): Payer: Self-pay

## 2022-01-30 ENCOUNTER — Other Ambulatory Visit (INDEPENDENT_AMBULATORY_CARE_PROVIDER_SITE_OTHER): Payer: Self-pay | Admitting: Family Medicine

## 2022-01-30 ENCOUNTER — Other Ambulatory Visit: Payer: Self-pay | Admitting: Internal Medicine

## 2022-01-30 ENCOUNTER — Other Ambulatory Visit (HOSPITAL_BASED_OUTPATIENT_CLINIC_OR_DEPARTMENT_OTHER): Payer: Self-pay

## 2022-01-30 DIAGNOSIS — R632 Polyphagia: Secondary | ICD-10-CM

## 2022-01-31 ENCOUNTER — Other Ambulatory Visit (HOSPITAL_BASED_OUTPATIENT_CLINIC_OR_DEPARTMENT_OTHER): Payer: Self-pay

## 2022-01-31 ENCOUNTER — Encounter (HOSPITAL_BASED_OUTPATIENT_CLINIC_OR_DEPARTMENT_OTHER): Payer: Self-pay

## 2022-02-01 ENCOUNTER — Other Ambulatory Visit (HOSPITAL_BASED_OUTPATIENT_CLINIC_OR_DEPARTMENT_OTHER): Payer: Self-pay

## 2022-02-01 ENCOUNTER — Other Ambulatory Visit: Payer: Self-pay

## 2022-02-01 DIAGNOSIS — L409 Psoriasis, unspecified: Secondary | ICD-10-CM | POA: Diagnosis not present

## 2022-02-01 DIAGNOSIS — G43109 Migraine with aura, not intractable, without status migrainosus: Secondary | ICD-10-CM | POA: Diagnosis not present

## 2022-02-01 DIAGNOSIS — R519 Headache, unspecified: Secondary | ICD-10-CM | POA: Diagnosis not present

## 2022-02-01 MED ORDER — VERAPAMIL HCL ER 120 MG PO CP24
120.0000 mg | ORAL_CAPSULE | Freq: Every day | ORAL | 3 refills | Status: DC
  Filled 2022-02-01: qty 90, 90d supply, fill #0
  Filled 2022-05-15: qty 90, 90d supply, fill #1
  Filled 2022-08-31: qty 90, 90d supply, fill #2
  Filled 2022-12-06: qty 90, 90d supply, fill #3

## 2022-02-01 MED ORDER — OTEZLA 30 MG PO TABS
30.0000 mg | ORAL_TABLET | Freq: Two times a day (BID) | ORAL | 11 refills | Status: DC
  Filled 2022-02-01: qty 60, 30d supply, fill #0

## 2022-02-01 MED ORDER — WEGOVY 1.7 MG/0.75ML ~~LOC~~ SOAJ
SUBCUTANEOUS | 4 refills | Status: AC
  Filled 2022-02-01: qty 3, 28d supply, fill #0

## 2022-02-02 ENCOUNTER — Other Ambulatory Visit (HOSPITAL_BASED_OUTPATIENT_CLINIC_OR_DEPARTMENT_OTHER): Payer: Self-pay

## 2022-02-05 ENCOUNTER — Other Ambulatory Visit: Payer: Self-pay | Admitting: Pharmacist

## 2022-02-05 ENCOUNTER — Other Ambulatory Visit (HOSPITAL_COMMUNITY): Payer: Self-pay

## 2022-02-05 MED ORDER — OTEZLA 30 MG PO TABS
30.0000 mg | ORAL_TABLET | Freq: Two times a day (BID) | ORAL | 11 refills | Status: DC
  Filled 2022-02-05 – 2022-02-22 (×2): qty 60, 30d supply, fill #0
  Filled 2022-03-27: qty 60, 30d supply, fill #1
  Filled 2022-04-17: qty 60, 30d supply, fill #2
  Filled 2022-05-15: qty 60, 30d supply, fill #3
  Filled 2022-06-20: qty 60, 30d supply, fill #4
  Filled 2022-07-17: qty 60, 30d supply, fill #5
  Filled 2022-08-20: qty 60, 30d supply, fill #6
  Filled 2022-09-11: qty 60, 30d supply, fill #7
  Filled 2022-10-09: qty 60, 30d supply, fill #8
  Filled 2022-11-07: qty 60, 30d supply, fill #9
  Filled 2022-12-07: qty 60, 30d supply, fill #10
  Filled 2023-01-14: qty 60, 30d supply, fill #11

## 2022-02-06 ENCOUNTER — Other Ambulatory Visit (HOSPITAL_COMMUNITY): Payer: Self-pay

## 2022-02-07 ENCOUNTER — Other Ambulatory Visit (HOSPITAL_BASED_OUTPATIENT_CLINIC_OR_DEPARTMENT_OTHER): Payer: Self-pay

## 2022-02-09 ENCOUNTER — Other Ambulatory Visit (HOSPITAL_BASED_OUTPATIENT_CLINIC_OR_DEPARTMENT_OTHER): Payer: Self-pay

## 2022-02-10 ENCOUNTER — Other Ambulatory Visit (HOSPITAL_BASED_OUTPATIENT_CLINIC_OR_DEPARTMENT_OTHER): Payer: Self-pay

## 2022-02-13 DIAGNOSIS — Z13220 Encounter for screening for lipoid disorders: Secondary | ICD-10-CM | POA: Diagnosis not present

## 2022-02-13 DIAGNOSIS — Z114 Encounter for screening for human immunodeficiency virus [HIV]: Secondary | ICD-10-CM | POA: Diagnosis not present

## 2022-02-13 DIAGNOSIS — Z0000 Encounter for general adult medical examination without abnormal findings: Secondary | ICD-10-CM | POA: Diagnosis not present

## 2022-02-13 DIAGNOSIS — E559 Vitamin D deficiency, unspecified: Secondary | ICD-10-CM | POA: Diagnosis not present

## 2022-02-14 ENCOUNTER — Other Ambulatory Visit (HOSPITAL_BASED_OUTPATIENT_CLINIC_OR_DEPARTMENT_OTHER): Payer: Self-pay

## 2022-02-19 ENCOUNTER — Other Ambulatory Visit (HOSPITAL_COMMUNITY): Payer: Self-pay

## 2022-02-20 ENCOUNTER — Other Ambulatory Visit (HOSPITAL_COMMUNITY): Payer: Self-pay

## 2022-02-22 ENCOUNTER — Other Ambulatory Visit (HOSPITAL_COMMUNITY): Payer: Self-pay

## 2022-02-22 ENCOUNTER — Other Ambulatory Visit (HOSPITAL_BASED_OUTPATIENT_CLINIC_OR_DEPARTMENT_OTHER): Payer: Self-pay

## 2022-02-22 ENCOUNTER — Other Ambulatory Visit (INDEPENDENT_AMBULATORY_CARE_PROVIDER_SITE_OTHER): Payer: Self-pay | Admitting: Family Medicine

## 2022-02-22 DIAGNOSIS — Z118 Encounter for screening for other infectious and parasitic diseases: Secondary | ICD-10-CM | POA: Diagnosis not present

## 2022-02-22 DIAGNOSIS — Z23 Encounter for immunization: Secondary | ICD-10-CM | POA: Diagnosis not present

## 2022-02-22 DIAGNOSIS — Z01419 Encounter for gynecological examination (general) (routine) without abnormal findings: Secondary | ICD-10-CM | POA: Diagnosis not present

## 2022-02-22 DIAGNOSIS — Z1211 Encounter for screening for malignant neoplasm of colon: Secondary | ICD-10-CM | POA: Diagnosis not present

## 2022-02-22 DIAGNOSIS — R632 Polyphagia: Secondary | ICD-10-CM

## 2022-02-22 DIAGNOSIS — Z113 Encounter for screening for infections with a predominantly sexual mode of transmission: Secondary | ICD-10-CM | POA: Diagnosis not present

## 2022-02-22 DIAGNOSIS — Z0000 Encounter for general adult medical examination without abnormal findings: Secondary | ICD-10-CM | POA: Diagnosis not present

## 2022-02-22 MED ORDER — ONDANSETRON HCL 8 MG PO TABS
8.0000 mg | ORAL_TABLET | Freq: Three times a day (TID) | ORAL | 2 refills | Status: AC | PRN
  Filled 2022-02-22: qty 20, 7d supply, fill #0

## 2022-03-02 ENCOUNTER — Other Ambulatory Visit (HOSPITAL_BASED_OUTPATIENT_CLINIC_OR_DEPARTMENT_OTHER): Payer: Self-pay

## 2022-03-02 ENCOUNTER — Other Ambulatory Visit (HOSPITAL_COMMUNITY): Payer: Self-pay

## 2022-03-15 ENCOUNTER — Other Ambulatory Visit (HOSPITAL_COMMUNITY): Payer: Self-pay

## 2022-03-22 ENCOUNTER — Other Ambulatory Visit (HOSPITAL_COMMUNITY): Payer: Self-pay

## 2022-03-23 ENCOUNTER — Other Ambulatory Visit (HOSPITAL_BASED_OUTPATIENT_CLINIC_OR_DEPARTMENT_OTHER): Payer: Self-pay

## 2022-03-23 MED ORDER — ZEPBOUND 12.5 MG/0.5ML ~~LOC~~ SOAJ
12.5000 mg | SUBCUTANEOUS | 2 refills | Status: DC
  Filled 2022-03-23: qty 6, 84d supply, fill #0

## 2022-03-26 ENCOUNTER — Other Ambulatory Visit (HOSPITAL_BASED_OUTPATIENT_CLINIC_OR_DEPARTMENT_OTHER): Payer: Self-pay

## 2022-03-27 ENCOUNTER — Other Ambulatory Visit (HOSPITAL_BASED_OUTPATIENT_CLINIC_OR_DEPARTMENT_OTHER): Payer: Self-pay

## 2022-03-27 ENCOUNTER — Other Ambulatory Visit (HOSPITAL_COMMUNITY): Payer: Self-pay

## 2022-03-28 ENCOUNTER — Other Ambulatory Visit (HOSPITAL_BASED_OUTPATIENT_CLINIC_OR_DEPARTMENT_OTHER): Payer: Self-pay

## 2022-03-28 MED ORDER — MOUNJARO 10 MG/0.5ML ~~LOC~~ SOAJ: 10.0000 mg | SUBCUTANEOUS | 0 refills | Status: AC

## 2022-03-28 MED ORDER — ZEPBOUND 15 MG/0.5ML ~~LOC~~ SOAJ
15.0000 mg | SUBCUTANEOUS | 0 refills | Status: AC
  Filled 2022-03-29: qty 6, 84d supply, fill #0

## 2022-03-28 MED ORDER — ZEPBOUND 10 MG/0.5ML ~~LOC~~ SOAJ
10.0000 mg | SUBCUTANEOUS | 0 refills | Status: AC
  Filled 2022-03-28: qty 6, 84d supply, fill #0

## 2022-03-29 ENCOUNTER — Other Ambulatory Visit (HOSPITAL_BASED_OUTPATIENT_CLINIC_OR_DEPARTMENT_OTHER): Payer: Self-pay

## 2022-04-06 ENCOUNTER — Other Ambulatory Visit: Payer: Self-pay

## 2022-04-06 ENCOUNTER — Other Ambulatory Visit (HOSPITAL_COMMUNITY): Payer: Self-pay

## 2022-04-17 ENCOUNTER — Other Ambulatory Visit (HOSPITAL_COMMUNITY): Payer: Self-pay

## 2022-04-19 ENCOUNTER — Other Ambulatory Visit (HOSPITAL_COMMUNITY): Payer: Self-pay

## 2022-04-20 ENCOUNTER — Other Ambulatory Visit: Payer: Self-pay

## 2022-04-23 ENCOUNTER — Other Ambulatory Visit (HOSPITAL_BASED_OUTPATIENT_CLINIC_OR_DEPARTMENT_OTHER): Payer: Self-pay

## 2022-04-23 DIAGNOSIS — E663 Overweight: Secondary | ICD-10-CM | POA: Diagnosis not present

## 2022-04-23 DIAGNOSIS — G4700 Insomnia, unspecified: Secondary | ICD-10-CM | POA: Diagnosis not present

## 2022-04-23 DIAGNOSIS — F331 Major depressive disorder, recurrent, moderate: Secondary | ICD-10-CM | POA: Diagnosis not present

## 2022-04-23 DIAGNOSIS — F411 Generalized anxiety disorder: Secondary | ICD-10-CM | POA: Diagnosis not present

## 2022-04-23 DIAGNOSIS — R5383 Other fatigue: Secondary | ICD-10-CM | POA: Diagnosis not present

## 2022-04-23 MED ORDER — ZEPBOUND 12.5 MG/0.5ML ~~LOC~~ SOAJ
12.5000 mg | SUBCUTANEOUS | 2 refills | Status: AC
  Filled 2022-04-23: qty 6, 84d supply, fill #0

## 2022-04-23 MED ORDER — DULOXETINE HCL 30 MG PO CPEP
30.0000 mg | ORAL_CAPSULE | Freq: Every day | ORAL | 2 refills | Status: AC
  Filled 2022-04-23: qty 90, 90d supply, fill #0

## 2022-04-26 ENCOUNTER — Other Ambulatory Visit (HOSPITAL_COMMUNITY): Payer: Self-pay

## 2022-04-26 ENCOUNTER — Other Ambulatory Visit (HOSPITAL_BASED_OUTPATIENT_CLINIC_OR_DEPARTMENT_OTHER): Payer: Self-pay

## 2022-04-30 ENCOUNTER — Other Ambulatory Visit (HOSPITAL_BASED_OUTPATIENT_CLINIC_OR_DEPARTMENT_OTHER): Payer: Self-pay

## 2022-05-15 ENCOUNTER — Other Ambulatory Visit (HOSPITAL_COMMUNITY): Payer: Self-pay

## 2022-05-15 ENCOUNTER — Other Ambulatory Visit: Payer: Self-pay

## 2022-05-25 ENCOUNTER — Other Ambulatory Visit (HOSPITAL_COMMUNITY): Payer: Self-pay

## 2022-05-28 ENCOUNTER — Other Ambulatory Visit: Payer: Self-pay | Admitting: Family Medicine

## 2022-05-28 DIAGNOSIS — Z1231 Encounter for screening mammogram for malignant neoplasm of breast: Secondary | ICD-10-CM

## 2022-05-30 ENCOUNTER — Ambulatory Visit (INDEPENDENT_AMBULATORY_CARE_PROVIDER_SITE_OTHER): Payer: 59

## 2022-05-30 DIAGNOSIS — Z1231 Encounter for screening mammogram for malignant neoplasm of breast: Secondary | ICD-10-CM

## 2022-06-01 ENCOUNTER — Other Ambulatory Visit: Payer: Self-pay | Admitting: Family Medicine

## 2022-06-01 DIAGNOSIS — R928 Other abnormal and inconclusive findings on diagnostic imaging of breast: Secondary | ICD-10-CM

## 2022-06-14 ENCOUNTER — Ambulatory Visit
Admission: RE | Admit: 2022-06-14 | Discharge: 2022-06-14 | Disposition: A | Discharge status: Home / Self Care | Payer: 59 | Source: Ambulatory Visit | Attending: Family Medicine | Admitting: Family Medicine

## 2022-06-14 DIAGNOSIS — R928 Other abnormal and inconclusive findings on diagnostic imaging of breast: Secondary | ICD-10-CM

## 2022-06-14 DIAGNOSIS — N6091 Unspecified benign mammary dysplasia of right breast: Secondary | ICD-10-CM | POA: Diagnosis not present

## 2022-06-20 ENCOUNTER — Other Ambulatory Visit (HOSPITAL_COMMUNITY): Payer: Self-pay

## 2022-06-22 ENCOUNTER — Other Ambulatory Visit (HOSPITAL_COMMUNITY): Payer: Self-pay

## 2022-06-27 DIAGNOSIS — E559 Vitamin D deficiency, unspecified: Secondary | ICD-10-CM | POA: Diagnosis not present

## 2022-06-27 DIAGNOSIS — D649 Anemia, unspecified: Secondary | ICD-10-CM | POA: Diagnosis not present

## 2022-06-27 DIAGNOSIS — E669 Obesity, unspecified: Secondary | ICD-10-CM | POA: Diagnosis not present

## 2022-07-02 ENCOUNTER — Other Ambulatory Visit (HOSPITAL_BASED_OUTPATIENT_CLINIC_OR_DEPARTMENT_OTHER): Payer: Self-pay

## 2022-07-02 DIAGNOSIS — M19041 Primary osteoarthritis, right hand: Secondary | ICD-10-CM | POA: Diagnosis not present

## 2022-07-02 DIAGNOSIS — J309 Allergic rhinitis, unspecified: Secondary | ICD-10-CM | POA: Diagnosis not present

## 2022-07-02 DIAGNOSIS — E559 Vitamin D deficiency, unspecified: Secondary | ICD-10-CM | POA: Diagnosis not present

## 2022-07-02 DIAGNOSIS — Z23 Encounter for immunization: Secondary | ICD-10-CM | POA: Diagnosis not present

## 2022-07-02 DIAGNOSIS — L409 Psoriasis, unspecified: Secondary | ICD-10-CM | POA: Diagnosis not present

## 2022-07-02 DIAGNOSIS — D509 Iron deficiency anemia, unspecified: Secondary | ICD-10-CM | POA: Diagnosis not present

## 2022-07-02 MED ORDER — DICLOFENAC SODIUM 1 % EX GEL
2.0000 g | Freq: Four times a day (QID) | CUTANEOUS | 1 refills | Status: AC
  Filled 2022-07-02: qty 500, 60d supply, fill #0

## 2022-07-02 MED ORDER — DULOXETINE HCL 30 MG PO CPEP
30.0000 mg | ORAL_CAPSULE | Freq: Every day | ORAL | 23 refills | Status: AC
  Filled 2022-07-02: qty 90, 90d supply, fill #0
  Filled 2022-08-20: qty 90, 90d supply, fill #1

## 2022-07-02 MED ORDER — OTEZLA 30 MG PO TABS
30.0000 mg | ORAL_TABLET | Freq: Two times a day (BID) | ORAL | 11 refills | Status: DC
  Filled 2022-07-02: qty 60, 30d supply, fill #0

## 2022-07-02 MED ORDER — CLOBETASOL PROPIONATE 0.05 % EX OINT
1.0000 | TOPICAL_OINTMENT | Freq: Two times a day (BID) | CUTANEOUS | 4 refills | Status: DC
  Filled 2022-07-02: qty 30, 15d supply, fill #0
  Filled 2022-11-12: qty 30, 15d supply, fill #1

## 2022-07-03 ENCOUNTER — Institutional Professional Consult (permissible substitution): Payer: 59 | Admitting: Plastic Surgery

## 2022-07-17 ENCOUNTER — Other Ambulatory Visit (HOSPITAL_COMMUNITY): Payer: Self-pay

## 2022-07-18 ENCOUNTER — Other Ambulatory Visit (HOSPITAL_COMMUNITY): Payer: Self-pay

## 2022-08-06 DIAGNOSIS — L409 Psoriasis, unspecified: Secondary | ICD-10-CM | POA: Diagnosis not present

## 2022-08-06 DIAGNOSIS — M19049 Primary osteoarthritis, unspecified hand: Secondary | ICD-10-CM | POA: Diagnosis not present

## 2022-08-15 ENCOUNTER — Other Ambulatory Visit (HOSPITAL_COMMUNITY): Payer: Self-pay

## 2022-08-17 ENCOUNTER — Other Ambulatory Visit (HOSPITAL_COMMUNITY): Payer: Self-pay

## 2022-08-20 ENCOUNTER — Encounter (HOSPITAL_COMMUNITY): Payer: Self-pay

## 2022-08-20 ENCOUNTER — Other Ambulatory Visit (HOSPITAL_COMMUNITY): Payer: Self-pay

## 2022-08-20 ENCOUNTER — Other Ambulatory Visit (HOSPITAL_BASED_OUTPATIENT_CLINIC_OR_DEPARTMENT_OTHER): Payer: Self-pay

## 2022-08-20 MED ORDER — AZELASTINE HCL 0.1 % NA SOLN
NASAL | 11 refills | Status: AC
  Filled 2022-08-20: qty 30, 30d supply, fill #0
  Filled 2022-10-18: qty 30, 30d supply, fill #1

## 2022-08-21 ENCOUNTER — Other Ambulatory Visit (HOSPITAL_BASED_OUTPATIENT_CLINIC_OR_DEPARTMENT_OTHER): Payer: Self-pay

## 2022-08-28 ENCOUNTER — Other Ambulatory Visit (HOSPITAL_BASED_OUTPATIENT_CLINIC_OR_DEPARTMENT_OTHER): Payer: Self-pay

## 2022-08-28 DIAGNOSIS — F411 Generalized anxiety disorder: Secondary | ICD-10-CM | POA: Diagnosis not present

## 2022-08-28 DIAGNOSIS — G4700 Insomnia, unspecified: Secondary | ICD-10-CM | POA: Diagnosis not present

## 2022-08-28 DIAGNOSIS — R5383 Other fatigue: Secondary | ICD-10-CM | POA: Diagnosis not present

## 2022-08-28 DIAGNOSIS — F331 Major depressive disorder, recurrent, moderate: Secondary | ICD-10-CM | POA: Diagnosis not present

## 2022-08-28 MED ORDER — DULOXETINE HCL 60 MG PO CPEP
60.0000 mg | ORAL_CAPSULE | Freq: Every day | ORAL | 0 refills | Status: DC
  Filled 2022-08-28: qty 90, 90d supply, fill #0

## 2022-08-31 ENCOUNTER — Other Ambulatory Visit (HOSPITAL_BASED_OUTPATIENT_CLINIC_OR_DEPARTMENT_OTHER): Payer: Self-pay

## 2022-09-11 ENCOUNTER — Other Ambulatory Visit (HOSPITAL_COMMUNITY): Payer: Self-pay

## 2022-09-17 ENCOUNTER — Other Ambulatory Visit (HOSPITAL_COMMUNITY): Payer: Self-pay

## 2022-09-19 ENCOUNTER — Other Ambulatory Visit (HOSPITAL_BASED_OUTPATIENT_CLINIC_OR_DEPARTMENT_OTHER): Payer: Self-pay

## 2022-09-19 DIAGNOSIS — G4700 Insomnia, unspecified: Secondary | ICD-10-CM | POA: Diagnosis not present

## 2022-09-19 DIAGNOSIS — F331 Major depressive disorder, recurrent, moderate: Secondary | ICD-10-CM | POA: Diagnosis not present

## 2022-09-19 DIAGNOSIS — F411 Generalized anxiety disorder: Secondary | ICD-10-CM | POA: Diagnosis not present

## 2022-09-19 MED ORDER — DULOXETINE HCL 60 MG PO CPEP
60.0000 mg | ORAL_CAPSULE | Freq: Every day | ORAL | 3 refills | Status: DC
  Filled 2022-09-19 – 2022-12-06 (×2): qty 90, 90d supply, fill #0
  Filled 2023-03-18: qty 90, 90d supply, fill #1
  Filled 2023-07-01: qty 90, 90d supply, fill #2

## 2022-09-19 MED ORDER — ONDANSETRON HCL 8 MG PO TABS
8.0000 mg | ORAL_TABLET | Freq: Three times a day (TID) | ORAL | 2 refills | Status: AC | PRN
  Filled 2022-09-19: qty 20, 7d supply, fill #0
  Filled 2022-11-12: qty 20, 7d supply, fill #1

## 2022-09-19 MED ORDER — ONDANSETRON 4 MG PO TBDP
4.0000 mg | ORAL_TABLET | Freq: Two times a day (BID) | ORAL | 2 refills | Status: AC | PRN
  Filled 2022-09-19: qty 30, 8d supply, fill #0

## 2022-10-09 ENCOUNTER — Other Ambulatory Visit: Payer: Self-pay

## 2022-10-12 ENCOUNTER — Other Ambulatory Visit (HOSPITAL_COMMUNITY): Payer: Self-pay

## 2022-10-18 ENCOUNTER — Other Ambulatory Visit (HOSPITAL_BASED_OUTPATIENT_CLINIC_OR_DEPARTMENT_OTHER): Payer: Self-pay

## 2022-10-20 ENCOUNTER — Encounter (HOSPITAL_COMMUNITY): Payer: Self-pay

## 2022-10-30 DIAGNOSIS — G4700 Insomnia, unspecified: Secondary | ICD-10-CM | POA: Diagnosis not present

## 2022-10-30 DIAGNOSIS — F331 Major depressive disorder, recurrent, moderate: Secondary | ICD-10-CM | POA: Diagnosis not present

## 2022-10-30 DIAGNOSIS — F411 Generalized anxiety disorder: Secondary | ICD-10-CM | POA: Diagnosis not present

## 2022-11-07 ENCOUNTER — Other Ambulatory Visit (HOSPITAL_COMMUNITY): Payer: Self-pay | Admitting: Pharmacy Technician

## 2022-11-07 ENCOUNTER — Other Ambulatory Visit (HOSPITAL_COMMUNITY): Payer: Self-pay

## 2022-11-07 NOTE — Progress Notes (Signed)
Specialty Pharmacy Refill Coordination Note  Laura Brown is a 52 y.o. female contacted today regarding refills of specialty medication(s) Apremilast   Patient requested Delivery   Delivery date: 11/14/22   Verified address: [address] [name]   Medication will be filled on 11/13/22.

## 2022-11-12 ENCOUNTER — Other Ambulatory Visit (HOSPITAL_BASED_OUTPATIENT_CLINIC_OR_DEPARTMENT_OTHER): Payer: Self-pay

## 2022-11-23 ENCOUNTER — Ambulatory Visit: Payer: 59 | Attending: Family Medicine | Admitting: Pharmacist

## 2022-11-23 DIAGNOSIS — Z79899 Other long term (current) drug therapy: Secondary | ICD-10-CM

## 2022-11-23 NOTE — Progress Notes (Signed)
  S: Patient presents for review of their specialty medication therapy.  Patient is currently taking Otezla for psoriasis. Patient is managed by Dr. Ernie Hew for this.   Adherence: confirms   Efficacy: confirms that the medication is working well for her.  Dosing:  Active psoriatic arthritis or plaque psoriasis (moderate to severe): Oral: 30 mg BID  Current adverse effects: Headache: none  GI upset: none  Weight loss: none  Neuropsychiatric effects: none   O:     Lab Results  Component Value Date   WBC 4.6 04/04/2021   HGB 14.3 04/04/2021   HCT 43 04/04/2021   MCV 80 12/27/2020   PLT 281 04/04/2021      Chemistry      Component Value Date/Time   NA 144 04/04/2021 0000   NA 143 03/22/2011 1014   K 4.6 04/04/2021 0000   K 3.9 03/22/2011 1014   CL 105 04/04/2021 0000   CL 107 03/22/2011 1014   CO2 33 (A) 04/04/2021 0000   CO2 26 03/22/2011 1014   BUN 8 04/04/2021 0000   BUN 14 03/22/2011 1014   CREATININE 0.8 04/04/2021 0000   CREATININE 0.76 12/27/2020 0927   CREATININE 0.73 03/22/2011 1014   GLU 80 04/04/2021 0000      Component Value Date/Time   CALCIUM 9.4 04/04/2021 0000   CALCIUM 8.7 03/22/2011 1014   ALKPHOS 63 04/04/2021 0000   ALKPHOS 54 03/22/2011 1014   AST 13 04/04/2021 0000   AST 21 03/22/2011 1014   ALT 7 04/04/2021 0000   ALT 15 03/22/2011 1014   BILITOT 0.2 12/27/2020 0927   BILITOT 0.5 03/22/2011 1014       A/P: 1. Medication review: patient is currently on [place] for psoriasis and is tolerating it well. Reviewed the medication including the following: apremilast inhibits phosphodiesterase 4 (PDE4) specific for cyclic adenosine monophosphate (cAMP) which results in increased intracellular cAMP levels and regulation of numerous inflammatory mediators (eg, decreased expression of nitric oxide synthase, TNF-alpha, and interleukin [IL]-23, as well as increased IL-10. Patient educated on purpose, proper use and potential adverse effects of Otezla.  Possible adverse effects include weight loss, GI upset, headache, and mood changes. Renal function should be routinely monitored. Administer without regard to food. Do not crush, chew, or split tablets. No recommendations for any changes at this time.   Benard Halsted, PharmD, [place], [place] [phone]

## 2022-12-03 DIAGNOSIS — R5383 Other fatigue: Secondary | ICD-10-CM | POA: Diagnosis not present

## 2022-12-03 DIAGNOSIS — R110 Nausea: Secondary | ICD-10-CM | POA: Diagnosis not present

## 2022-12-05 ENCOUNTER — Other Ambulatory Visit: Payer: Self-pay

## 2022-12-06 ENCOUNTER — Other Ambulatory Visit (HOSPITAL_BASED_OUTPATIENT_CLINIC_OR_DEPARTMENT_OTHER): Payer: Self-pay

## 2022-12-07 ENCOUNTER — Other Ambulatory Visit (HOSPITAL_COMMUNITY): Payer: Self-pay

## 2022-12-07 ENCOUNTER — Other Ambulatory Visit (HOSPITAL_COMMUNITY): Payer: Self-pay | Admitting: Pharmacy Technician

## 2022-12-07 ENCOUNTER — Other Ambulatory Visit: Payer: Self-pay

## 2022-12-07 NOTE — Progress Notes (Signed)
Specialty Pharmacy Refill Coordination Note  Laura Brown is a 52 y.o. female contacted today regarding refills of specialty medication(s) Apremilast   Patient requested Delivery   Delivery date: 12/17/22   Verified address: [address], [place], [place]   Medication will be filled on 12/14/22.

## 2022-12-14 ENCOUNTER — Other Ambulatory Visit: Payer: Self-pay

## 2022-12-14 NOTE — Progress Notes (Signed)
Patient was contacted regarding her copay of $142 for [facility]. Patient to contact copay card program at [phone] for any further assistance. Medication will be on hold until patient can update the speciality pharmacy.

## 2022-12-17 ENCOUNTER — Other Ambulatory Visit: Payer: Self-pay

## 2022-12-19 ENCOUNTER — Other Ambulatory Visit (HOSPITAL_COMMUNITY): Payer: Self-pay

## 2022-12-19 ENCOUNTER — Other Ambulatory Visit: Payer: Self-pay

## 2022-12-19 ENCOUNTER — Other Ambulatory Visit (HOSPITAL_BASED_OUTPATIENT_CLINIC_OR_DEPARTMENT_OTHER): Payer: Self-pay

## 2022-12-19 DIAGNOSIS — J309 Allergic rhinitis, unspecified: Secondary | ICD-10-CM | POA: Diagnosis not present

## 2022-12-19 DIAGNOSIS — L409 Psoriasis, unspecified: Secondary | ICD-10-CM | POA: Diagnosis not present

## 2022-12-19 MED ORDER — AZELASTINE HCL 0.15 % NA SOLN
1.0000 | Freq: Two times a day (BID) | NASAL | 11 refills | Status: AC
  Filled 2022-12-19: qty 30, 75d supply, fill #0

## 2022-12-19 MED ORDER — PHENTERMINE HCL 37.5 MG PO TABS
37.5000 mg | ORAL_TABLET | Freq: Every day | ORAL | 0 refills | Status: AC
  Filled 2022-12-19: qty 30, 30d supply, fill #0

## 2022-12-19 NOTE — Progress Notes (Signed)
Spoke with patient aware of new shipping date 11/20 for 11/21.  Patient has focus Plan [id number].  Was able to waive copay $142 per Tiffany p. & Kim P.

## 2022-12-31 ENCOUNTER — Other Ambulatory Visit (HOSPITAL_BASED_OUTPATIENT_CLINIC_OR_DEPARTMENT_OTHER): Payer: Self-pay

## 2023-01-08 ENCOUNTER — Other Ambulatory Visit: Payer: Self-pay

## 2023-01-09 DIAGNOSIS — L409 Psoriasis, unspecified: Secondary | ICD-10-CM | POA: Diagnosis not present

## 2023-01-09 DIAGNOSIS — J309 Allergic rhinitis, unspecified: Secondary | ICD-10-CM | POA: Diagnosis not present

## 2023-01-11 ENCOUNTER — Other Ambulatory Visit: Payer: Self-pay

## 2023-01-14 ENCOUNTER — Other Ambulatory Visit: Payer: Self-pay

## 2023-01-14 NOTE — Progress Notes (Signed)
Specialty Pharmacy Refill Coordination Note  OTHA Brown is a 52 y.o. female contacted today regarding refills of specialty medication(s) Apremilast [name])   Patient requested (Patient-Rptd) Delivery   Delivery date: (Patient-Rptd) 01/21/23   Verified address: (Patient-Rptd) [address]   Medication will be filled on 01/18/23.

## 2023-01-18 ENCOUNTER — Other Ambulatory Visit: Payer: Self-pay

## 2023-02-07 DIAGNOSIS — J309 Allergic rhinitis, unspecified: Secondary | ICD-10-CM | POA: Diagnosis not present

## 2023-02-07 DIAGNOSIS — F411 Generalized anxiety disorder: Secondary | ICD-10-CM | POA: Diagnosis not present

## 2023-02-08 ENCOUNTER — Other Ambulatory Visit (HOSPITAL_COMMUNITY): Payer: Self-pay

## 2023-02-09 ENCOUNTER — Other Ambulatory Visit: Payer: Self-pay | Admitting: Pharmacist

## 2023-02-09 ENCOUNTER — Encounter (HOSPITAL_COMMUNITY): Payer: Self-pay

## 2023-02-09 MED ORDER — OTEZLA 30 MG PO TABS
30.0000 mg | ORAL_TABLET | Freq: Two times a day (BID) | ORAL | 11 refills | Status: DC
  Filled 2023-02-11 – 2023-02-13 (×2): qty 60, 30d supply, fill #0
  Filled 2023-03-18: qty 60, 30d supply, fill #1
  Filled 2023-04-11: qty 60, 30d supply, fill #2

## 2023-02-09 NOTE — Progress Notes (Signed)
 Please see office visit from 11/23/2022 for additional documentation.   Butch Penny, PharmD, [place] [place], CPP Clinical Pharmacist [facility] [phone]

## 2023-02-11 ENCOUNTER — Other Ambulatory Visit: Payer: Self-pay

## 2023-02-11 ENCOUNTER — Other Ambulatory Visit (HOSPITAL_COMMUNITY): Payer: Self-pay

## 2023-02-13 ENCOUNTER — Other Ambulatory Visit (HOSPITAL_COMMUNITY): Payer: Self-pay | Admitting: Pharmacy Technician

## 2023-02-13 ENCOUNTER — Other Ambulatory Visit (HOSPITAL_COMMUNITY): Payer: Self-pay

## 2023-02-13 ENCOUNTER — Other Ambulatory Visit: Payer: Self-pay

## 2023-02-13 NOTE — Progress Notes (Signed)
 Specialty Pharmacy Refill Coordination Note  Laura Brown is a 54 y.o. female contacted today regarding refills of specialty medication(s) Apremilast  ([facility] )   Patient requested Delivery   Delivery date: 02/15/23   Verified address: Patient address [address]  [place]   Medication will be filled on 02/14/23.

## 2023-02-14 ENCOUNTER — Other Ambulatory Visit: Payer: Self-pay

## 2023-02-14 ENCOUNTER — Other Ambulatory Visit (HOSPITAL_COMMUNITY): Payer: Self-pay

## 2023-02-15 ENCOUNTER — Other Ambulatory Visit (HOSPITAL_COMMUNITY): Payer: Self-pay

## 2023-02-20 DIAGNOSIS — Z0000 Encounter for general adult medical examination without abnormal findings: Secondary | ICD-10-CM | POA: Diagnosis not present

## 2023-02-20 DIAGNOSIS — Z13220 Encounter for screening for lipoid disorders: Secondary | ICD-10-CM | POA: Diagnosis not present

## 2023-02-25 ENCOUNTER — Other Ambulatory Visit: Payer: Self-pay

## 2023-02-25 ENCOUNTER — Other Ambulatory Visit (HOSPITAL_COMMUNITY): Payer: Self-pay

## 2023-02-25 NOTE — Progress Notes (Signed)
Mail 1.27.25. Patient has e-voucher for [employer] and [phone]. Can use cone $0

## 2023-02-28 DIAGNOSIS — Z0000 Encounter for general adult medical examination without abnormal findings: Secondary | ICD-10-CM | POA: Diagnosis not present

## 2023-02-28 DIAGNOSIS — Z23 Encounter for immunization: Secondary | ICD-10-CM | POA: Diagnosis not present

## 2023-03-14 ENCOUNTER — Other Ambulatory Visit: Payer: Self-pay

## 2023-03-18 ENCOUNTER — Telehealth: Payer: Commercial Managed Care - PPO | Admitting: Physician Assistant

## 2023-03-18 ENCOUNTER — Other Ambulatory Visit: Payer: Self-pay

## 2023-03-18 ENCOUNTER — Other Ambulatory Visit (HOSPITAL_COMMUNITY): Payer: Self-pay

## 2023-03-18 ENCOUNTER — Other Ambulatory Visit (HOSPITAL_BASED_OUTPATIENT_CLINIC_OR_DEPARTMENT_OTHER): Payer: Self-pay

## 2023-03-18 DIAGNOSIS — J329 Chronic sinusitis, unspecified: Secondary | ICD-10-CM

## 2023-03-18 MED ORDER — VERAPAMIL HCL ER 120 MG PO CP24
120.0000 mg | ORAL_CAPSULE | Freq: Every day | ORAL | 3 refills | Status: AC
  Filled 2023-03-18: qty 90, 90d supply, fill #0

## 2023-03-18 MED ORDER — FLUTICASONE PROPIONATE 50 MCG/ACT NA SUSP
2.0000 | Freq: Every day | NASAL | 6 refills | Status: AC
  Filled 2023-03-18: qty 16, 30d supply, fill #0

## 2023-03-18 MED ORDER — CETIRIZINE-PSEUDOEPHEDRINE ER 5-120 MG PO TB12
1.0000 | ORAL_TABLET | Freq: Two times a day (BID) | ORAL | 0 refills | Status: AC
Start: 2023-03-18 — End: 2023-03-24
  Filled 2023-03-18: qty 12, 6d supply, fill #0

## 2023-03-18 NOTE — Progress Notes (Signed)
Specialty Pharmacy Refill Coordination Note  Laura Brown is a 53 y.o. female contacted today regarding refills of specialty medication(s) Apremilast [name])   Patient requested (Patient-Rptd) Delivery   Delivery date: (Patient-Rptd) 03/25/23   Verified address: (Patient-Rptd) [address] [place], [place] [address]   Medication will be filled on 02.21.25.

## 2023-03-18 NOTE — Progress Notes (Signed)
 I have spent 5 minutes in review of e-visit questionnaire, review and updating patient chart, medical decision making and response to patient.   Laure Kidney, PA-C

## 2023-03-18 NOTE — Progress Notes (Signed)
 E-Visit for Sinus Problems  We are sorry that you are not feeling well.  Here is how we plan to help!  Based on what you have shared with me it looks like you have sinusitis.  Sinusitis is inflammation and infection in the sinus cavities of the head.  Based on your presentation I believe you most likely have Acute Viral Sinusitis.This is an infection most likely caused by a virus. There is not specific treatment for viral sinusitis other than to help you with the symptoms until the infection runs its course.  You may use an oral decongestant such as Mucinex D or if you have glaucoma or high blood pressure use plain Mucinex. Saline nasal spray help and can safely be used as often as needed for congestion, I have prescribed: Fluticasone nasal spray two sprays in each nostril once a day  Some authorities believe that zinc sprays or the use of Echinacea may shorten the course of your symptoms.  Sinus infections are not as easily transmitted as other respiratory infection, however we still recommend that you avoid close contact with loved ones, especially the very young and elderly.  Remember to wash your hands thoroughly throughout the day as this is the number one way to prevent the spread of infection!  Home Care: Only take medications as instructed by your medical team. Do not take these medications with alcohol. A steam or ultrasonic humidifier can help congestion.  You can place a towel over your head and breathe in the steam from hot water coming from a faucet. Avoid close contacts especially the very young and the elderly. Cover your mouth when you cough or sneeze. Always remember to wash your hands.  Get Help Right Away If: You develop worsening fever or sinus pain. You develop a severe head ache or visual changes. Your symptoms persist after you have completed your treatment plan.  Make sure you Understand these instructions. Will watch your condition. Will get help right away if you  are not doing well or get worse.   Thank you for choosing an e-visit.  Your e-visit answers were reviewed by a board certified advanced clinical practitioner to complete your personal care plan. Depending upon the condition, your plan could have included both over the counter or prescription medications.  Please review your pharmacy choice. Make sure the pharmacy is open so you can pick up prescription now. If there is a problem, you may contact your provider through [employer] and have the prescription routed to another pharmacy.  Your safety is important to [place]. If you have drug allergies check your prescription carefully.   For the next 24 hours you can use MyChart to ask questions about today's visit, request a non-urgent call back, or ask for a work or school excuse. You will get an email in the next two days asking about your experience. I hope that your e-visit has been valuable and will speed your recovery.

## 2023-03-19 ENCOUNTER — Other Ambulatory Visit: Payer: Self-pay

## 2023-03-22 ENCOUNTER — Other Ambulatory Visit: Payer: Self-pay

## 2023-03-27 IMAGING — DX DG KNEE 1-2V PORT*L*
2 series · 2 of 2 positions shown · non-contrast
Comparison: 02/10/2015.

CLINICAL DATA: 49-year-old female status post left knee surgery.

EXAM:
PORTABLE LEFT KNEE - 1-2 VIEW

[knee ap]
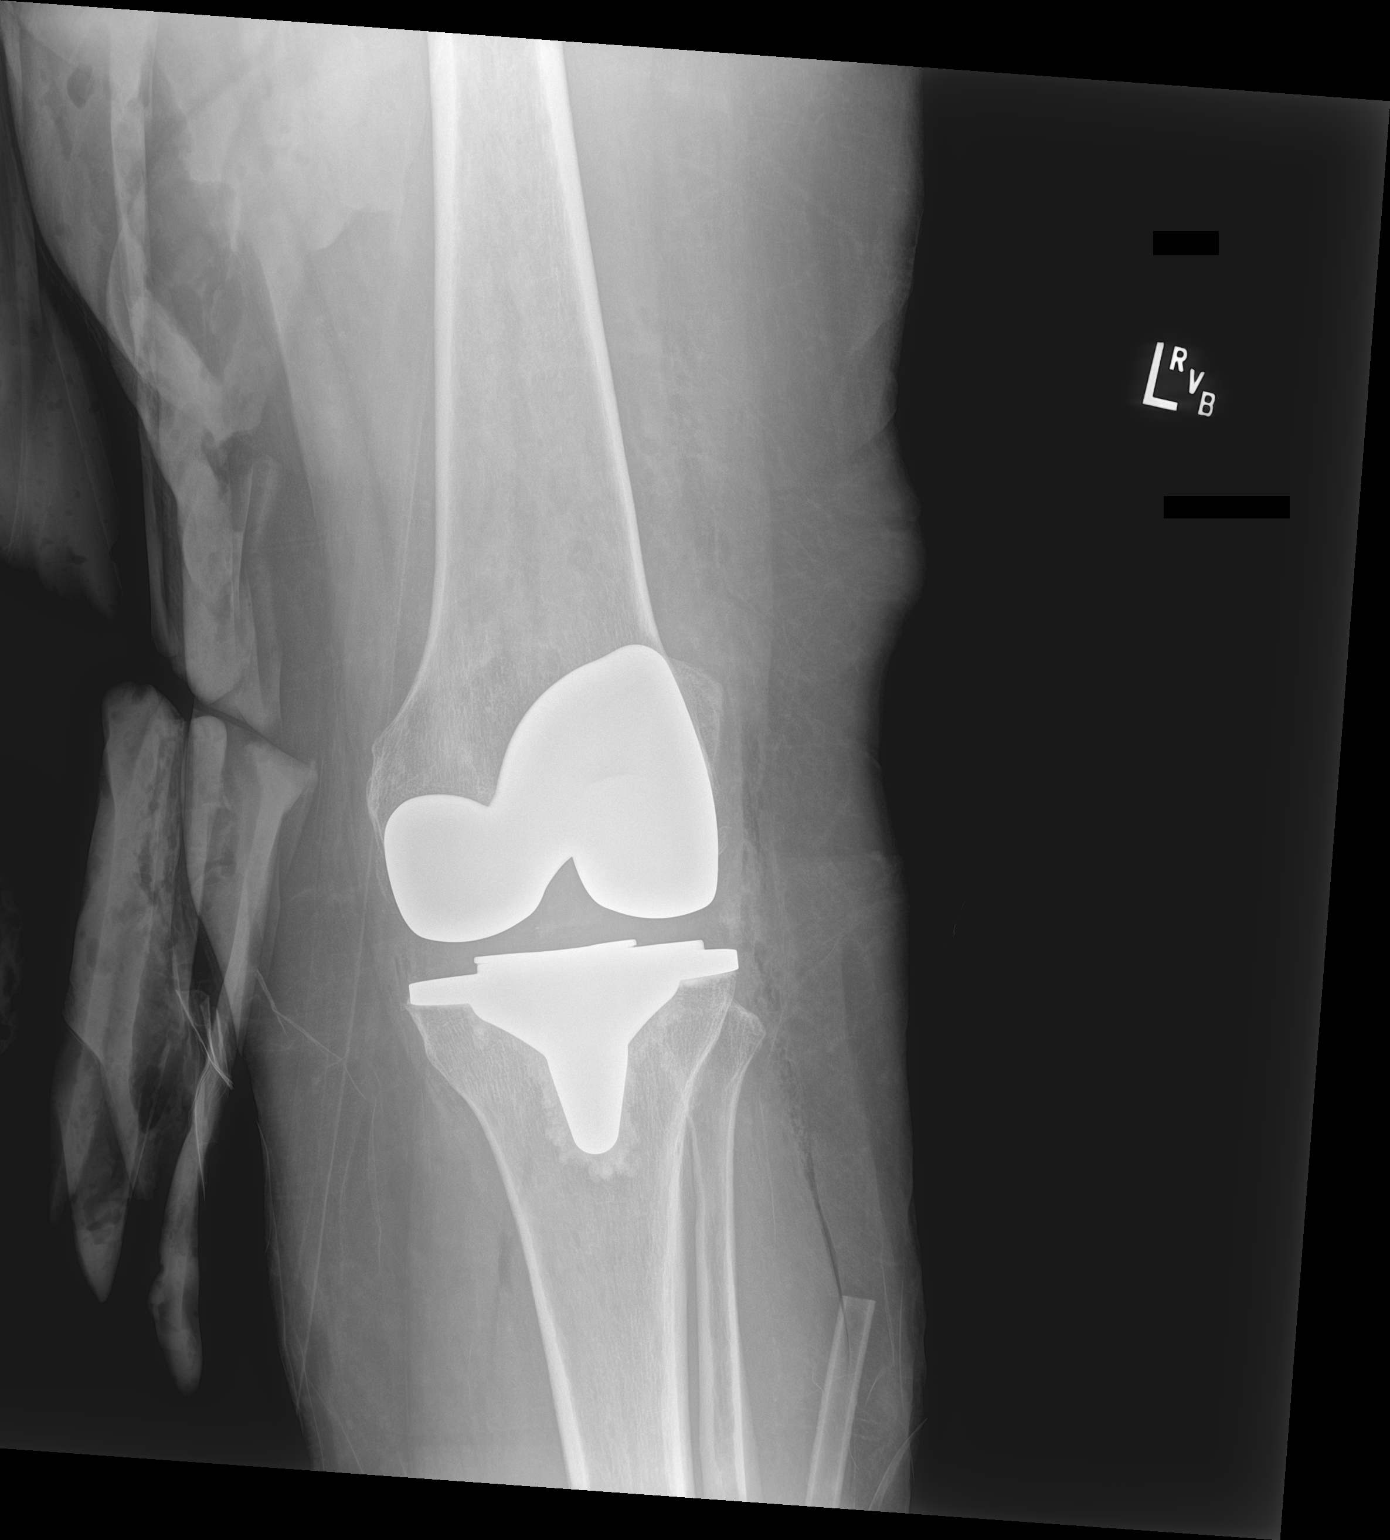

[knee lat]
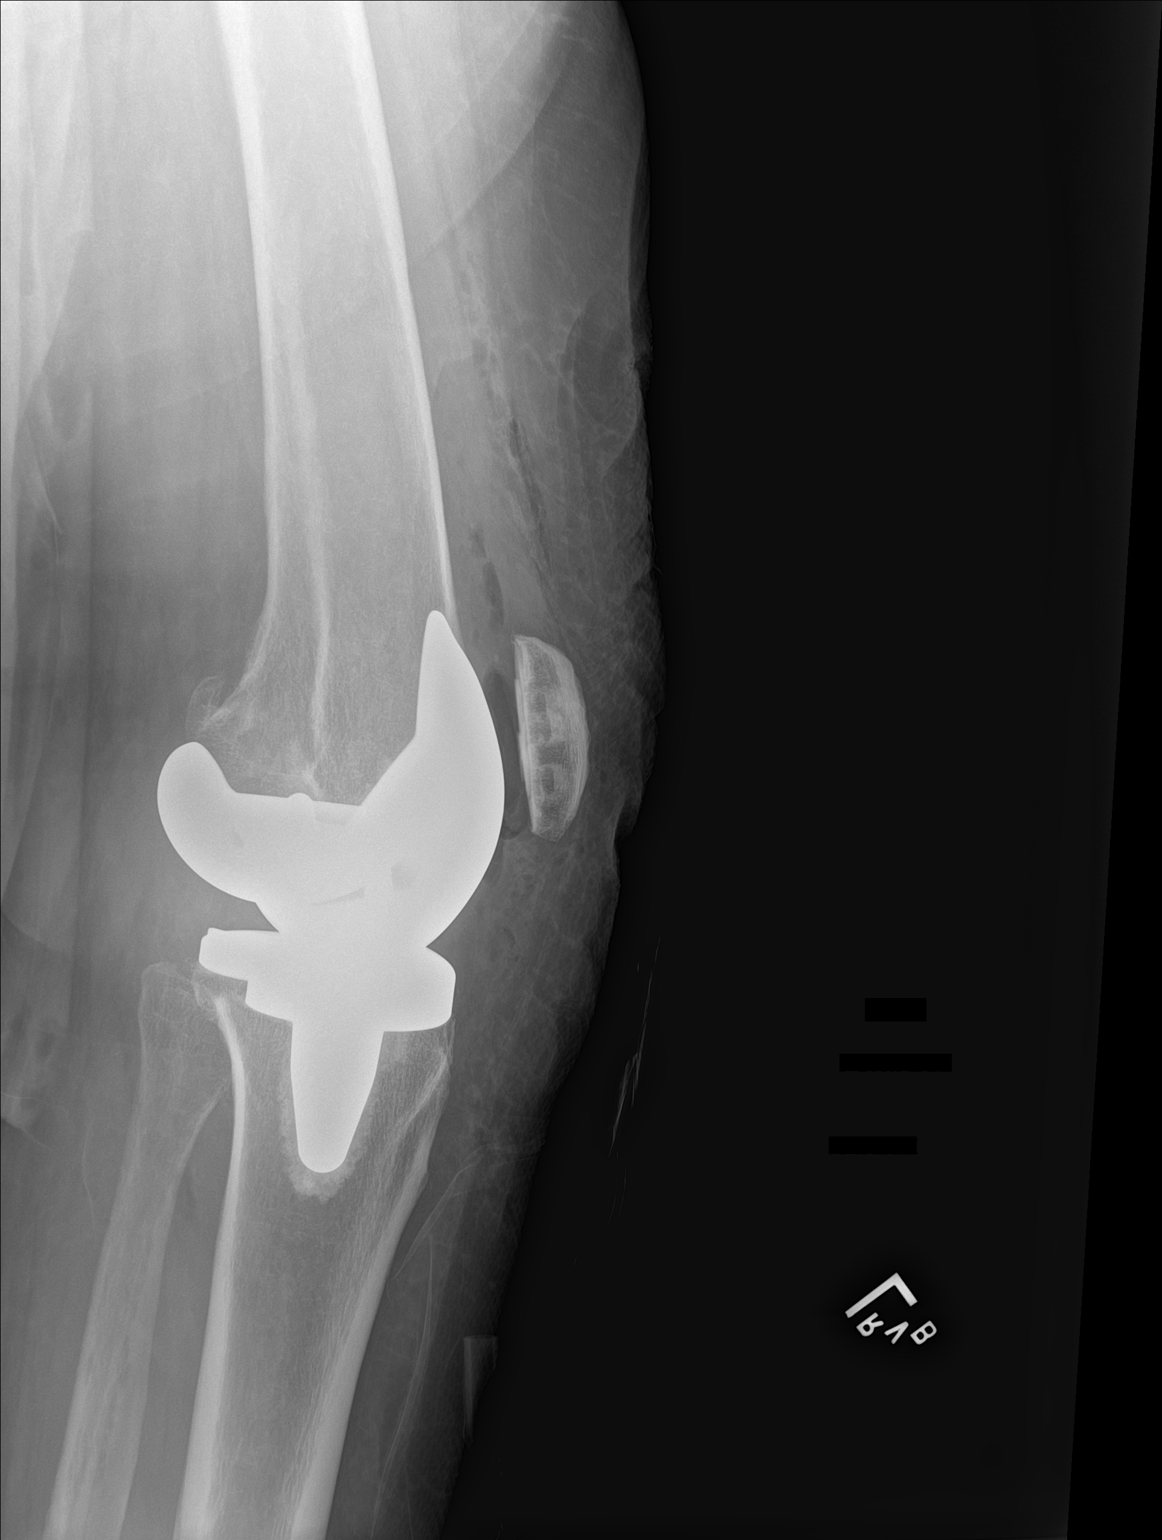

[2 of 2 positions shown; findings below may reference images not displayed]

FINDINGS: AP and cross-table lateral portable views of the left knee in PACU.
Left total knee arthroplasty hardware appears intact and normally
aligned. Postoperative changes to the patella. No unexpected osseous
changes. Small volume gas and fluid in the joint space.
IMPRESSION: Left total knee arthroplasty with no adverse features.

## 2023-04-11 ENCOUNTER — Other Ambulatory Visit: Payer: Self-pay

## 2023-04-11 ENCOUNTER — Other Ambulatory Visit (HOSPITAL_COMMUNITY): Payer: Self-pay

## 2023-04-11 NOTE — Progress Notes (Signed)
 Specialty Pharmacy Refill Coordination Note  Laura Brown is a 53 y.o. female contacted today regarding refills of specialty medication(s) Apremilast [name])   Patient requested (Patient-Rptd) Delivery   Delivery date: (Patient-Rptd) 04/29/23   Verified address: (Patient-Rptd) [address]   Medication will be filled on 04/26/23.

## 2023-04-26 ENCOUNTER — Other Ambulatory Visit: Payer: Self-pay

## 2023-04-30 ENCOUNTER — Other Ambulatory Visit (HOSPITAL_COMMUNITY): Payer: Self-pay

## 2023-05-14 ENCOUNTER — Other Ambulatory Visit: Payer: Self-pay

## 2023-05-16 ENCOUNTER — Other Ambulatory Visit: Payer: Self-pay

## 2023-05-20 ENCOUNTER — Other Ambulatory Visit: Payer: Self-pay

## 2023-05-24 ENCOUNTER — Ambulatory Visit (HOSPITAL_BASED_OUTPATIENT_CLINIC_OR_DEPARTMENT_OTHER)
Admission: EM | Admit: 2023-05-24 | Discharge: 2023-05-24 | Disposition: A | Discharge status: Home / Self Care | Attending: Family Medicine | Admitting: Family Medicine

## 2023-05-24 ENCOUNTER — Encounter (HOSPITAL_BASED_OUTPATIENT_CLINIC_OR_DEPARTMENT_OTHER): Payer: Self-pay

## 2023-05-24 DIAGNOSIS — J029 Acute pharyngitis, unspecified: Secondary | ICD-10-CM | POA: Diagnosis not present

## 2023-05-24 LAB — POCT RAPID STREP A (OFFICE): Rapid Strep A Screen: NEGATIVE

## 2023-05-24 MED ORDER — AMOXICILLIN 500 MG PO CAPS: 1000.0000 mg | ORAL_CAPSULE | Freq: Every day | ORAL | 0 refills | Status: AC

## 2023-05-24 NOTE — ED Triage Notes (Signed)
 Bilat ear pain and sore throat onset yesterday. No cold symptoms. No nasal drainage.

## 2023-05-24 NOTE — Discharge Instructions (Signed)
 We will go ahead and treat for potential strep throat today based on your symptoms and the way your throat looks. Treating with amoxicillin .  Take the medication as prescribed.  You can do ibuprofen for pain and inflammation.  Recommend Flonase  and Zyrtec  daily. Follow-up as needed

## 2023-05-25 NOTE — ED Provider Notes (Signed)
 Laura Brown CARE    CSN: [id number] Arrival date & time: 05/24/23  1752      History   Chief Complaint Chief Complaint  Patient presents with   Sore Throat   Otalgia    HPI Laura Brown is a 53 y.o. female.   Patient is a 53 year old female that presents today with bilateral ear pain, pressure, sore throat onset yesterday.  Denies any associated nasal congestion or drainage.  Denies any cough, chest congestion, fevers, chills.  She does have a history of seasonal allergies. Not taking any allergy meds currently.  History of strep in the past and feels similar   Sore Throat  Otalgia   Past Medical History:  Diagnosis Date   Anxiety    Depression    Osteoarthritis of both knees     Patient Active Problem List   Diagnosis Date Noted   Iron deficiency anemia 02/08/2021   S/P TKR (total knee replacement), left 07/26/2020   Insulin  resistance 04/04/2020   Right calf pain 03/02/2020   S/P TKR (total knee replacement), right 01/19/2020   Osteoarthritis of right knee 01/05/2020   Vitamin D  deficiency 08/19/2019   S/P laparoscopic sleeve gastrectomy 08/19/2019   Depression 07/15/2018   Class 3 severe obesity with serious comorbidity and body mass index (BMI) of 45.0 to 49.9 in adult (HCC) 07/15/2018   Primary osteoarthritis of both knees 04/20/2014   Low back pain 04/20/2014    Past Surgical History:  Procedure Laterality Date   CESAREAN SECTION     LAPAROSCOPIC GASTRIC SLEEVE RESECTION     TOTAL KNEE ARTHROPLASTY Right 01/19/2020   Procedure: TOTAL KNEE ARTHROPLASTY;  Surgeon: Osa Blase, MD;  Location: [facility];  Service: Orthopedics;  Laterality: Right;   TOTAL KNEE ARTHROPLASTY Left 07/26/2020   Procedure: TOTAL KNEE ARTHROPLASTY;  Surgeon: Osa Blase, MD;  Location: [facility];  Service: Orthopedics;  Laterality: Left;    OB History     Gravida  2   Para      Term      Preterm      AB      Living  2      SAB      IAB      Ectopic       Multiple      Live Births               Home Medications    Prior to Admission medications   Medication Sig Start Date End Date Taking? Authorizing Provider  amoxicillin  (AMOXIL ) 500 MG capsule Take 2 capsules (1,000 mg total) by mouth daily for 10 days. 05/24/23 06/03/23 Yes Khyler Urda A, FNP  Apremilast  (OTEZLA ) 30 MG TABS Take 1 tablet (30 mg total) by mouth 2 (two) times daily. 02/09/23   Jegede, Olugbemiga E, MD  azelastine  (ASTELIN ) 0.1 % nasal spray Place 1 - 2 sprays into each nostril 1 - 2 times daily for nasal congestion 08/20/22     Azelastine  HCl 0.15 % SOLN Place 1 - 2 sprays into each nostril 1 - 2 times daily for nasal congestion 12/19/22     Cholecalciferol  (VITAMIN D ) 125 MCG (5000 UT) CAPS Take 1 capsule by mouth daily. 06/21/21   Whitmire, Dawn, FNP  clobetasol  ointment (TEMOVATE ) 0.05 % Apply 1 application topically to affected area 2 times per day for 10 days 09/13/21     clobetasol  ointment (TEMOVATE ) 0.05 % Apply 1 application topically to affected area 2 (two) times daily for 10  days. 07/02/22     COVID-19 At Home Antigen Test [facility] COVID-19 HOME TEST) KIT Use as directed. 03/02/21   Shirlee Dotter, RPH  diclofenac  Sodium (VOLTAREN ) 1 % GEL Apply 2 grams topically to affected area(s) on upper extremity 4 (four) times daily. 07/02/22     DULoxetine  (CYMBALTA ) 30 MG capsule 1 capsule orally daily 10/20/21     DULoxetine  (CYMBALTA ) 30 MG capsule Take 1 capsule (30 mg total) by mouth daily. 10/20/21     DULoxetine  (CYMBALTA ) 30 MG capsule Take 1 capsule (30 mg total) by mouth daily. 04/23/22     DULoxetine  (CYMBALTA ) 30 MG capsule Take 1 capsule (30 mg total) by mouth daily. 07/02/22     DULoxetine  (CYMBALTA ) 60 MG capsule TAKE 2 CAPSULES BY MOUTH DAILY 01/17/21     DULoxetine  (CYMBALTA ) 60 MG capsule Take 1 capsule (60 mg total) by mouth daily. 09/19/22     fluticasone  (FLONASE ) 50 MCG/ACT nasal spray Place 2 sprays into both nostrils daily. 03/18/23   Marciana Settle,  PA-C  mupirocin  ointment (BACTROBAN ) 2 % Apply 1 Application topically 2 (two) times daily as needed for 5-7 days 10/20/21     ondansetron  (ZOFRAN ) 8 MG tablet Take 1 tablet (8 mg total) by mouth every 8 (eight) hours as needed. 02/22/22     ondansetron  (ZOFRAN ) 8 MG tablet Take 1 tablet (8 mg total) by mouth every 8 (eight) hours as needed for nausea or migraines. 09/19/22     ondansetron  (ZOFRAN -ODT) 4 MG disintegrating tablet Take 1-2 tablets (4-8 mg total) by mouth 2 (two) times daily as needed  for 1-2 days. 09/19/22     phentermine  (ADIPEX-P ) 37.5 MG tablet Take 1 tablet (37.5 mg total) by mouth daily. 12/19/22     tirzepatide  (MOUNJARO ) 10 MG/0.5ML Pen Inject 10 mg into the skin once a week. 03/28/22     tirzepatide  (ZEPBOUND ) 10 MG/0.5ML Pen Inject 10 mg into the skin once a week. 03/28/22     tirzepatide  (ZEPBOUND ) 12.5 MG/0.5ML Pen Inject 12.5 mg into the skin once a week. 04/23/22     tirzepatide  (ZEPBOUND ) 15 MG/0.5ML Pen Inject 15 mg into the skin once a week. 03/29/22     verapamil  (CALAN  SR) 120 MG CR tablet Take 1 tablet by mouth daily 07/11/21     verapamil  (VERELAN ) 120 MG 24 hr capsule Take 1 capsule (120 mg total) by mouth daily. 03/18/23     WEGOVY  1.7 MG/0.75ML SOAJ Inject 0.75 ml subcutaneously weekly 02/01/22       Family History Family History  Problem Relation Age of Onset   Hyperlipidemia Mother    Hyperlipidemia Father    Hypertension Father     Social History Social History   Tobacco Use   Smoking status: Never   Smokeless tobacco: Never  Vaping Use   Vaping status: Never Used  Substance Use Topics   Alcohol use: Never    Alcohol/week: 0.0 standard drinks of alcohol   Drug use: Never     Allergies   Patient has no known allergies.   Review of Systems Review of Systems  HENT:  Positive for ear pain.      Physical Exam Triage Vital Signs ED Triage Vitals  Encounter Vitals Group     BP 05/24/23 1849 118/72     Systolic BP Percentile --       Diastolic BP Percentile --      Pulse Rate 05/24/23 1849 (!) 58     Resp 05/24/23 1849 20  Temp 05/24/23 1849 97.8 F (36.6 C)     Temp Source 05/24/23 1849 Oral     SpO2 05/24/23 1849 98 %     Weight --      Height --      Head Circumference --      Peak Flow --      Pain Score 05/24/23 1850 6     Pain Loc --      Pain Education --      Exclude from Growth Chart --    No data found.  Updated Vital Signs BP 118/72 (BP Location: Right Arm)   Pulse (!) 58   Temp 97.8 F (36.6 C) (Oral)   Resp 20   SpO2 98%   Visual Acuity Right Eye Distance:   Left Eye Distance:   Bilateral Distance:    Right Eye Near:   Left Eye Near:    Bilateral Near:     Physical Exam Constitutional:      General: She is not in acute distress.    Appearance: Normal appearance. She is not ill-appearing, toxic-appearing or diaphoretic.  HENT:     Head: Normocephalic and atraumatic.     Right Ear: Tympanic membrane and ear canal normal.     Left Ear: Tympanic membrane and ear canal normal.     Nose: No congestion or rhinorrhea.     Mouth/Throat:     Pharynx: Posterior oropharyngeal erythema present.     Tonsils: Tonsillar exudate present. 2+ on the right. 2+ on the left.  Eyes:     Conjunctiva/sclera: Conjunctivae normal.  Cardiovascular:     Rate and Rhythm: Normal rate and regular rhythm.     Pulses: Normal pulses.     Heart sounds: Normal heart sounds.  Pulmonary:     Effort: Pulmonary effort is normal.     Breath sounds: Normal breath sounds.  Skin:    General: Skin is warm and dry.  Neurological:     Mental Status: She is alert.  Psychiatric:        Mood and Affect: Mood normal.      UC Treatments / Results  Labs (all labs ordered are listed, but only abnormal results are displayed) Labs Reviewed  POCT RAPID STREP A (OFFICE) - Normal    EKG   Radiology No results found.  Procedures Procedures (including critical care time)  Medications Ordered in UC Medications  - No data to display  Initial Impression / Assessment and Plan / UC Course  I have reviewed the triage vital signs and the nursing notes.  Pertinent labs & imaging results that were available during my care of the patient were reviewed by me and considered in my medical decision making (see chart for details).     Sore throat-rapid strep test negative here today.  Will send for culture.  Patient like to go ahead and treat for strep based on her symptoms and history of previous strep infections.  There was some tonsillar swelling, erythema and exudate on exam. Treating with amoxicillin  at this time. Can ibuprofen for pain and inflammation Recommend Flonase  and Zyrtec  daily Final Clinical Impressions(s) / UC Diagnoses   Final diagnoses:  Sore throat     Discharge Instructions      We will go ahead and treat for potential strep throat today based on your symptoms and the way your throat looks. Treating with amoxicillin .  Take the medication as prescribed.  You can do ibuprofen for pain and inflammation.  Recommend  Flonase  and Zyrtec  daily. Follow-up as needed   ED Prescriptions     Medication Sig Dispense Auth. Provider   amoxicillin  (AMOXIL ) 500 MG capsule Take 2 capsules (1,000 mg total) by mouth daily for 10 days. 20 capsule Landa Pine, FNP      PDMP not reviewed this encounter.   Landa Pine, FNP 05/25/23 [phone]

## 2023-05-30 DIAGNOSIS — H6690 Otitis media, unspecified, unspecified ear: Secondary | ICD-10-CM | POA: Diagnosis not present

## 2023-05-30 DIAGNOSIS — J309 Allergic rhinitis, unspecified: Secondary | ICD-10-CM | POA: Diagnosis not present

## 2023-06-10 DIAGNOSIS — J069 Acute upper respiratory infection, unspecified: Secondary | ICD-10-CM | POA: Diagnosis not present

## 2023-06-10 DIAGNOSIS — G43109 Migraine with aura, not intractable, without status migrainosus: Secondary | ICD-10-CM | POA: Diagnosis not present

## 2023-06-10 DIAGNOSIS — I959 Hypotension, unspecified: Secondary | ICD-10-CM | POA: Diagnosis not present

## 2023-06-10 DIAGNOSIS — Z1159 Encounter for screening for other viral diseases: Secondary | ICD-10-CM | POA: Diagnosis not present

## 2023-06-17 ENCOUNTER — Other Ambulatory Visit (HOSPITAL_BASED_OUTPATIENT_CLINIC_OR_DEPARTMENT_OTHER): Payer: Self-pay

## 2023-06-17 DIAGNOSIS — G43109 Migraine with aura, not intractable, without status migrainosus: Secondary | ICD-10-CM | POA: Diagnosis not present

## 2023-06-17 DIAGNOSIS — R5383 Other fatigue: Secondary | ICD-10-CM | POA: Diagnosis not present

## 2023-06-17 DIAGNOSIS — E559 Vitamin D deficiency, unspecified: Secondary | ICD-10-CM | POA: Diagnosis not present

## 2023-06-17 DIAGNOSIS — R42 Dizziness and giddiness: Secondary | ICD-10-CM | POA: Diagnosis not present

## 2023-06-17 MED ORDER — NURTEC 75 MG PO TBDP
75.0000 mg | ORAL_TABLET | ORAL | 11 refills | Status: AC
  Filled 2023-06-17: qty 16, 30d supply, fill #0

## 2023-06-18 ENCOUNTER — Other Ambulatory Visit (HOSPITAL_BASED_OUTPATIENT_CLINIC_OR_DEPARTMENT_OTHER): Payer: Self-pay

## 2023-06-20 ENCOUNTER — Other Ambulatory Visit (HOSPITAL_BASED_OUTPATIENT_CLINIC_OR_DEPARTMENT_OTHER): Payer: Self-pay

## 2023-06-21 ENCOUNTER — Other Ambulatory Visit (HOSPITAL_BASED_OUTPATIENT_CLINIC_OR_DEPARTMENT_OTHER): Payer: Self-pay

## 2023-06-25 ENCOUNTER — Other Ambulatory Visit (HOSPITAL_BASED_OUTPATIENT_CLINIC_OR_DEPARTMENT_OTHER): Payer: Self-pay

## 2023-06-27 DIAGNOSIS — R5383 Other fatigue: Secondary | ICD-10-CM | POA: Diagnosis not present

## 2023-06-27 DIAGNOSIS — G43109 Migraine with aura, not intractable, without status migrainosus: Secondary | ICD-10-CM | POA: Diagnosis not present

## 2023-06-27 DIAGNOSIS — E559 Vitamin D deficiency, unspecified: Secondary | ICD-10-CM | POA: Diagnosis not present

## 2023-07-01 ENCOUNTER — Other Ambulatory Visit (HOSPITAL_BASED_OUTPATIENT_CLINIC_OR_DEPARTMENT_OTHER): Payer: Self-pay

## 2023-07-30 ENCOUNTER — Other Ambulatory Visit: Payer: Self-pay

## 2023-09-03 DIAGNOSIS — J309 Allergic rhinitis, unspecified: Secondary | ICD-10-CM | POA: Diagnosis not present

## 2023-09-03 DIAGNOSIS — G43109 Migraine with aura, not intractable, without status migrainosus: Secondary | ICD-10-CM | POA: Diagnosis not present

## 2023-09-03 DIAGNOSIS — R519 Headache, unspecified: Secondary | ICD-10-CM | POA: Diagnosis not present

## 2023-09-19 IMAGING — MG MM DIGITAL SCREENING BILAT W/ TOMO AND CAD
8 series · 8 of 24 positions shown · non-contrast
Comparison: Previous exam(s).

CLINICAL DATA: Screening.

EXAM:
DIGITAL SCREENING BILATERAL MAMMOGRAM WITH TOMOSYNTHESIS AND CAD
TECHNIQUE: Bilateral screening digital craniocaudal and mediolateral oblique
mammograms were obtained. Bilateral screening digital breast
tomosynthesis was performed. The images were evaluated with
computer-aided detection.

[L MLO synth-2D]
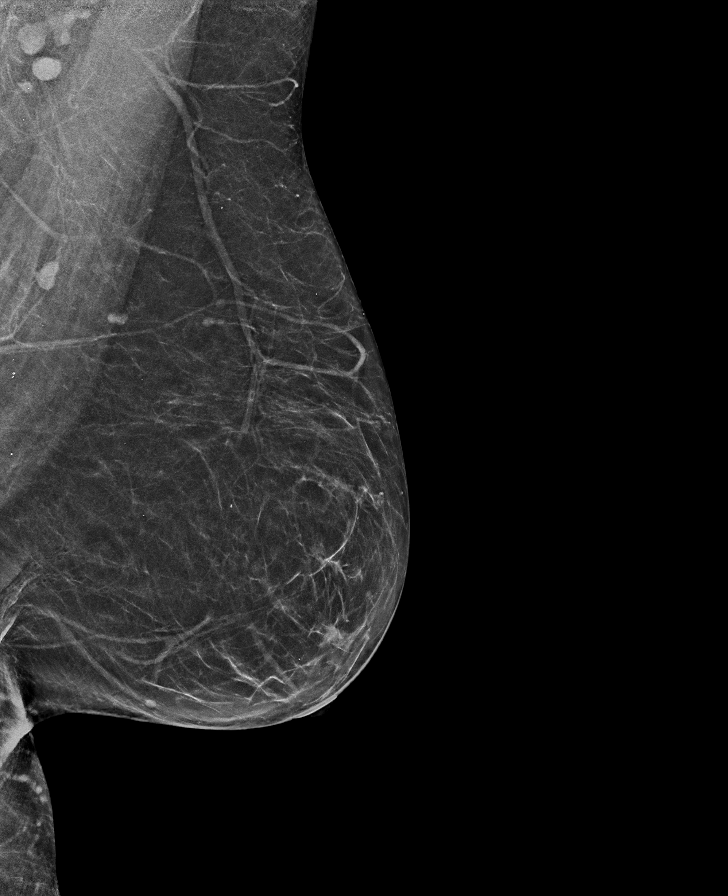

[R CC synth-2D]
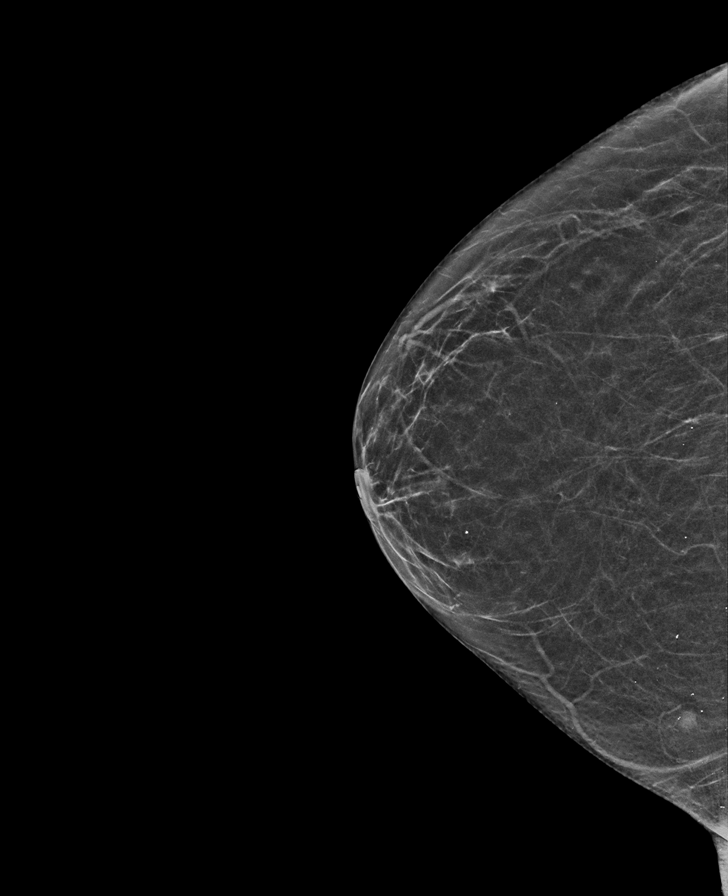

[R MLO synth-2D]
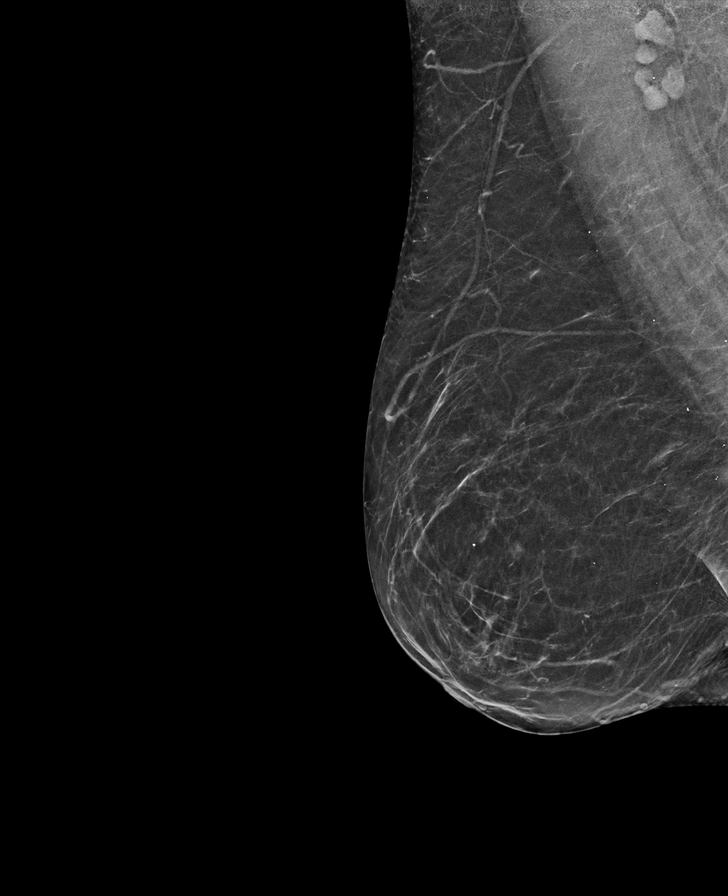

[L CC synth-2D]
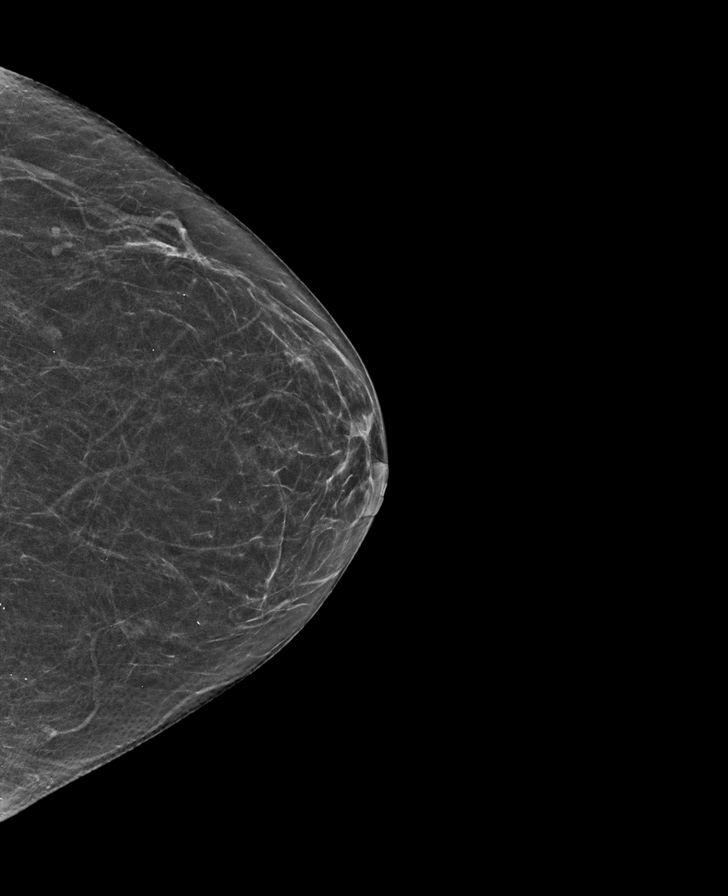

[R CC tomo · tomo slice 28/55.0]
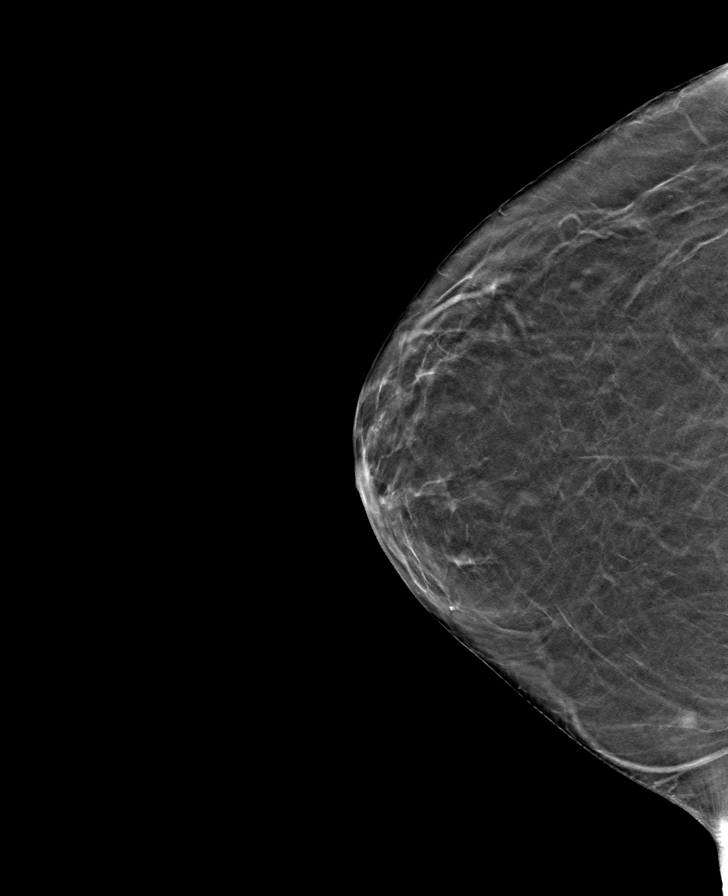

[L CC tomo · tomo slice 29/56.0]
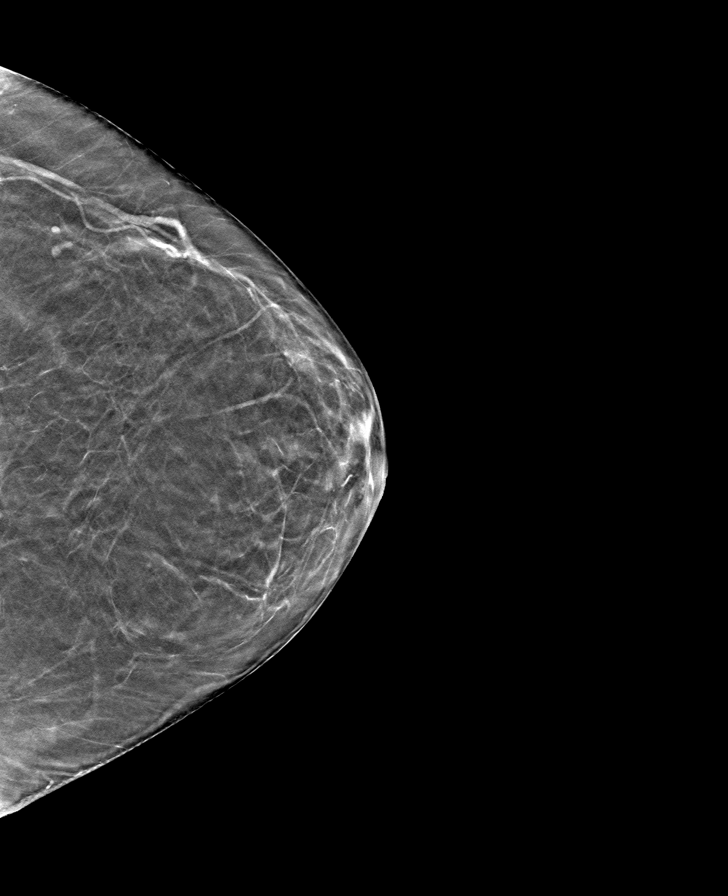

[R MLO tomo · tomo slice 31/61.0]
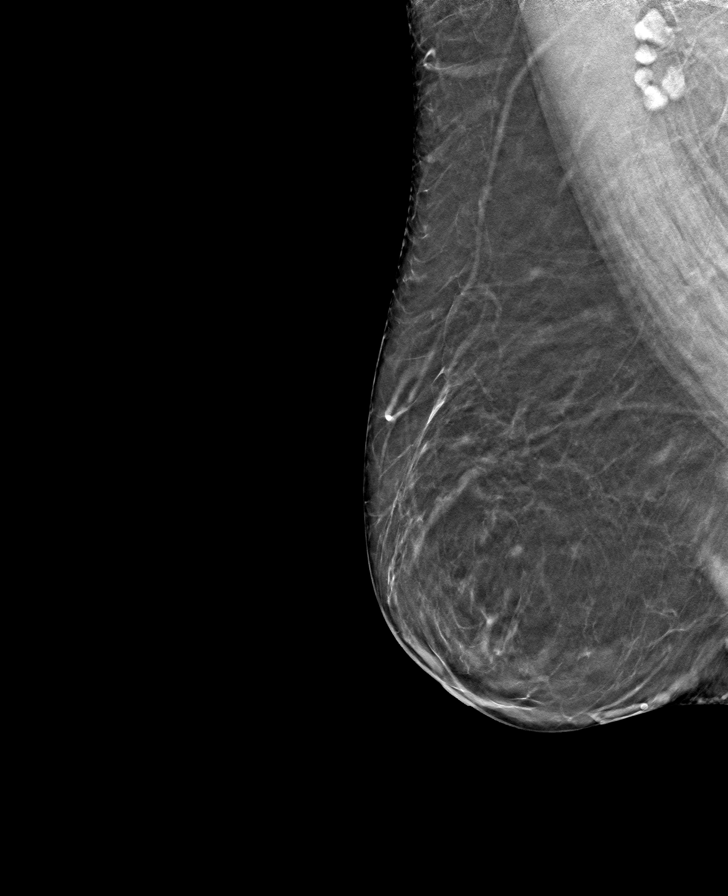

[L MLO tomo · tomo slice 32/63.0]
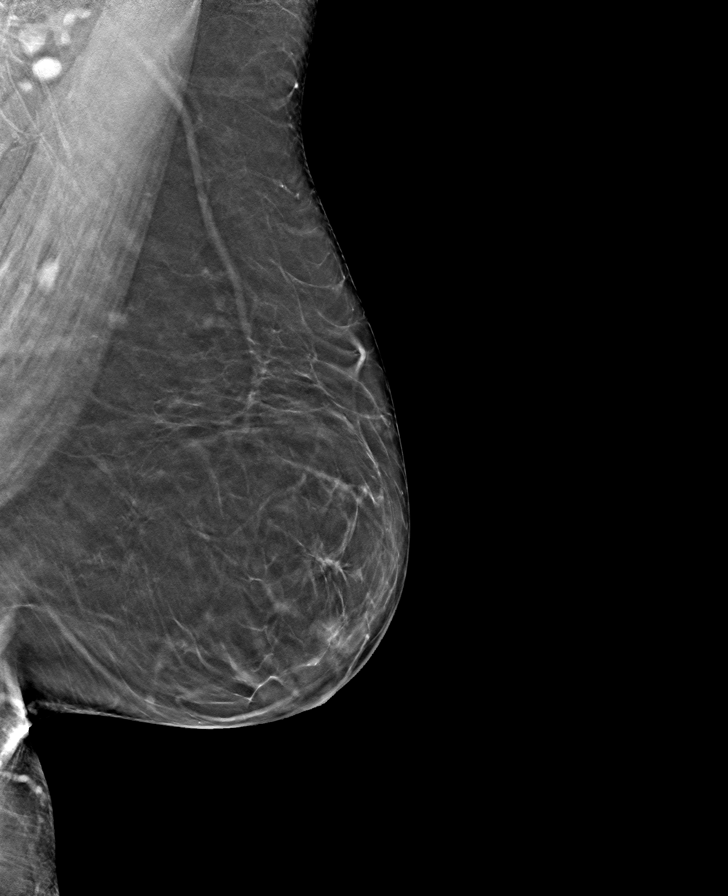

[8 of 24 positions shown; findings below may reference images not displayed]

ACR Breast Density Category b: There are scattered areas of
fibroglandular density.
FINDINGS: There are no findings suspicious for malignancy.
IMPRESSION: No mammographic evidence of malignancy. A result letter of this
screening mammogram will be mailed directly to the patient.

RECOMMENDATION:
Screening mammogram in one year. (Code:51-O-LD2)

BI-RADS CATEGORY  1: Negative.

## 2023-10-03 ENCOUNTER — Other Ambulatory Visit (HOSPITAL_BASED_OUTPATIENT_CLINIC_OR_DEPARTMENT_OTHER): Payer: Self-pay

## 2023-10-04 ENCOUNTER — Other Ambulatory Visit (HOSPITAL_BASED_OUTPATIENT_CLINIC_OR_DEPARTMENT_OTHER): Payer: Self-pay

## 2023-10-04 MED ORDER — DULOXETINE HCL 60 MG PO CPEP
60.0000 mg | ORAL_CAPSULE | Freq: Every day | ORAL | 3 refills | Status: AC
  Filled 2023-10-04: qty 90, 90d supply, fill #0

## 2023-10-18 ENCOUNTER — Other Ambulatory Visit (HOSPITAL_BASED_OUTPATIENT_CLINIC_OR_DEPARTMENT_OTHER): Payer: Self-pay

## 2023-10-18 ENCOUNTER — Telehealth: Admitting: Physician Assistant

## 2023-10-18 DIAGNOSIS — H9202 Otalgia, left ear: Secondary | ICD-10-CM | POA: Diagnosis not present

## 2023-10-18 MED ORDER — CETIRIZINE-PSEUDOEPHEDRINE ER 5-120 MG PO TB12
1.0000 | ORAL_TABLET | Freq: Two times a day (BID) | ORAL | 0 refills | Status: AC
Start: 2023-10-18 — End: 2023-10-23
  Filled 2023-10-18: qty 10, 5d supply, fill #0

## 2023-10-18 MED ORDER — IPRATROPIUM BROMIDE 0.06 % NA SOLN
2.0000 | Freq: Four times a day (QID) | NASAL | 12 refills | Status: AC
  Filled 2023-10-18: qty 15, 19d supply, fill #0
  Filled 2023-11-06: qty 15, 10d supply, fill #1

## 2023-10-18 NOTE — Progress Notes (Signed)
 I have spent 5 minutes in review of e-visit questionnaire, review and updating patient chart, medical decision making and response to patient.   Laure Kidney, PA-C

## 2023-10-18 NOTE — Progress Notes (Signed)

## 2023-11-04 ENCOUNTER — Other Ambulatory Visit (HOSPITAL_BASED_OUTPATIENT_CLINIC_OR_DEPARTMENT_OTHER): Payer: Self-pay

## 2023-11-05 ENCOUNTER — Other Ambulatory Visit (HOSPITAL_BASED_OUTPATIENT_CLINIC_OR_DEPARTMENT_OTHER): Payer: Self-pay | Admitting: Family Medicine

## 2023-11-05 DIAGNOSIS — Z1231 Encounter for screening mammogram for malignant neoplasm of breast: Secondary | ICD-10-CM

## 2023-11-06 ENCOUNTER — Other Ambulatory Visit (HOSPITAL_BASED_OUTPATIENT_CLINIC_OR_DEPARTMENT_OTHER): Payer: Self-pay

## 2023-11-07 ENCOUNTER — Other Ambulatory Visit: Payer: Self-pay

## 2023-11-07 NOTE — Progress Notes (Signed)
 Patient has not filled since 04/26/23 and has not reached out for refill. Disenrolling.

## 2023-11-11 ENCOUNTER — Other Ambulatory Visit (HOSPITAL_BASED_OUTPATIENT_CLINIC_OR_DEPARTMENT_OTHER): Payer: Self-pay

## 2023-11-11 DIAGNOSIS — G43909 Migraine, unspecified, not intractable, without status migrainosus: Secondary | ICD-10-CM | POA: Diagnosis not present

## 2023-11-11 DIAGNOSIS — G43109 Migraine with aura, not intractable, without status migrainosus: Secondary | ICD-10-CM | POA: Diagnosis not present

## 2023-11-11 DIAGNOSIS — J309 Allergic rhinitis, unspecified: Secondary | ICD-10-CM | POA: Diagnosis not present

## 2023-11-11 MED ORDER — AZELASTINE HCL 0.1 % NA SOLN
1.0000 | NASAL | 11 refills | Status: AC
  Filled 2023-11-11: qty 30, 30d supply, fill #0

## 2023-11-14 ENCOUNTER — Ambulatory Visit (HOSPITAL_BASED_OUTPATIENT_CLINIC_OR_DEPARTMENT_OTHER)
Admission: RE | Admit: 2023-11-14 | Discharge: 2023-11-14 | Disposition: A | Discharge status: Home / Self Care | Source: Ambulatory Visit | Attending: Family Medicine | Admitting: Family Medicine

## 2023-11-14 ENCOUNTER — Other Ambulatory Visit (HOSPITAL_BASED_OUTPATIENT_CLINIC_OR_DEPARTMENT_OTHER): Payer: Self-pay

## 2023-11-14 ENCOUNTER — Encounter (HOSPITAL_BASED_OUTPATIENT_CLINIC_OR_DEPARTMENT_OTHER): Payer: Self-pay

## 2023-11-14 DIAGNOSIS — Z1231 Encounter for screening mammogram for malignant neoplasm of breast: Secondary | ICD-10-CM | POA: Diagnosis present

## 2023-11-14 MED ORDER — FLUZONE 0.5 ML IM SUSY
0.5000 mL | PREFILLED_SYRINGE | Freq: Once | INTRAMUSCULAR | 0 refills | Status: AC
  Filled 2023-11-14: qty 0.5, 1d supply, fill #0

## 2024-01-13 ENCOUNTER — Other Ambulatory Visit (HOSPITAL_BASED_OUTPATIENT_CLINIC_OR_DEPARTMENT_OTHER): Payer: Self-pay

## 2024-01-13 ENCOUNTER — Other Ambulatory Visit: Payer: Self-pay

## 2024-01-13 MED ORDER — AZELASTINE HCL 0.1 % NA SOLN
1.0000 | NASAL | 11 refills | Status: AC
  Filled 2024-01-13: qty 30, 30d supply, fill #0

## 2024-01-13 MED ORDER — DULOXETINE HCL 60 MG PO CPEP
60.0000 mg | ORAL_CAPSULE | Freq: Every day | ORAL | 3 refills | Status: AC
  Filled 2024-01-13: qty 90, 90d supply, fill #0

## 2024-01-13 MED ORDER — NURTEC 75 MG PO TBDP
75.0000 mg | ORAL_TABLET | Freq: Every day | ORAL | 11 refills | Status: AC
  Filled 2024-01-13: qty 10, 10d supply, fill #0

## 2024-01-13 MED ORDER — OTEZLA 30 MG PO TABS
30.0000 mg | ORAL_TABLET | Freq: Two times a day (BID) | ORAL | 11 refills | Status: AC
  Filled 2024-01-13 – 2024-02-13 (×2): qty 60, 30d supply, fill #0

## 2024-01-17 ENCOUNTER — Other Ambulatory Visit (HOSPITAL_BASED_OUTPATIENT_CLINIC_OR_DEPARTMENT_OTHER): Payer: Self-pay

## 2024-01-21 ENCOUNTER — Other Ambulatory Visit: Payer: Self-pay

## 2024-02-13 ENCOUNTER — Other Ambulatory Visit (HOSPITAL_BASED_OUTPATIENT_CLINIC_OR_DEPARTMENT_OTHER): Payer: Self-pay

## 2024-02-13 ENCOUNTER — Telehealth: Payer: Self-pay | Admitting: Pharmacist

## 2024-02-13 ENCOUNTER — Other Ambulatory Visit: Payer: Self-pay

## 2024-02-13 ENCOUNTER — Telehealth: Payer: Self-pay

## 2024-02-13 NOTE — Telephone Encounter (Signed)
 Called patient to schedule an appointment for the [facility] [employer] Specialty Medication Clinic. I was unable to reach the patient so I left a HIPAA-compliant message requesting that the patient return my call.   Herlene Fleeta Morris, PharmD, JAQUELINE, CPP Clinical Pharmacist [facility] [phone]

## 2024-02-13 NOTE — Progress Notes (Signed)
 Pharmacy Patient Advocate Encounter  Insurance verification completed.   The patient is insured through [facility]   Ran test claim for Otezla . PA required.  This test claim was processed through [facility]- copay amounts may vary at other pharmacies due to pharmacy/plan contracts, or as the patient moves through the different stages of their insurance plan.

## 2024-02-13 NOTE — Progress Notes (Signed)
 Benefits investigation started in new encounter. Sent to [facility] for employee clinic visit

## 2024-02-13 NOTE — Telephone Encounter (Signed)
 Pharmacy Patient Advocate Encounter   Received notification from Pt Calls Messages that prior authorization for Otezla  is required/requested.   Insurance verification completed.   The patient is insured through [facility].   Per test claim: PA required; PA submitted to above mentioned insurance via Latent Key/confirmation #/EOC [id number] Status is pending

## 2024-02-14 ENCOUNTER — Other Ambulatory Visit (HOSPITAL_COMMUNITY): Payer: Self-pay

## 2024-02-14 ENCOUNTER — Ambulatory Visit: Attending: Family Medicine | Admitting: Pharmacist

## 2024-02-14 ENCOUNTER — Other Ambulatory Visit: Payer: Self-pay

## 2024-02-14 DIAGNOSIS — Z79899 Other long term (current) drug therapy: Secondary | ICD-10-CM

## 2024-02-14 MED ORDER — OTEZLA 30 MG PO TABS
30.0000 mg | ORAL_TABLET | Freq: Two times a day (BID) | ORAL | 11 refills | Status: AC
  Filled 2024-02-14 – 2024-02-20 (×3): qty 60, 30d supply, fill #0

## 2024-02-14 NOTE — Progress Notes (Signed)
  S: Patient presents for review of their specialty medication therapy.  Patient is currently taking Otezla for psoriasis. Patient is managed by Dr. Ernie Hew for this.   Adherence: confirms   Efficacy: confirms that the medication is working well for her.  Dosing:  Active psoriatic arthritis or plaque psoriasis (moderate to severe): Oral: 30 mg BID  Current adverse effects: Headache: none  GI upset: none  Weight loss: none  Neuropsychiatric effects: none   O:     Lab Results  Component Value Date   WBC 4.6 04/04/2021   HGB 14.3 04/04/2021   HCT 43 04/04/2021   MCV 80 12/27/2020   PLT 281 04/04/2021      Chemistry      Component Value Date/Time   NA 144 04/04/2021 0000   NA 143 03/22/2011 1014   K 4.6 04/04/2021 0000   K 3.9 03/22/2011 1014   CL 105 04/04/2021 0000   CL 107 03/22/2011 1014   CO2 33 (A) 04/04/2021 0000   CO2 26 03/22/2011 1014   BUN 8 04/04/2021 0000   BUN 14 03/22/2011 1014   CREATININE 0.8 04/04/2021 0000   CREATININE 0.76 12/27/2020 0927   CREATININE 0.73 03/22/2011 1014   GLU 80 04/04/2021 0000      Component Value Date/Time   CALCIUM 9.4 04/04/2021 0000   CALCIUM 8.7 03/22/2011 1014   ALKPHOS 63 04/04/2021 0000   ALKPHOS 54 03/22/2011 1014   AST 13 04/04/2021 0000   AST 21 03/22/2011 1014   ALT 7 04/04/2021 0000   ALT 15 03/22/2011 1014   BILITOT 0.2 12/27/2020 0927   BILITOT 0.5 03/22/2011 1014       A/P: 1. Medication review: patient is currently on [place] for psoriasis and is tolerating it well. Reviewed the medication including the following: apremilast inhibits phosphodiesterase 4 (PDE4) specific for cyclic adenosine monophosphate (cAMP) which results in increased intracellular cAMP levels and regulation of numerous inflammatory mediators (eg, decreased expression of nitric oxide synthase, TNF-alpha, and interleukin [IL]-23, as well as increased IL-10. Patient educated on purpose, proper use and potential adverse effects of Otezla.  Possible adverse effects include weight loss, GI upset, headache, and mood changes. Renal function should be routinely monitored. Administer without regard to food. Do not crush, chew, or split tablets. No recommendations for any changes at this time.   Benard Halsted, PharmD, [place], [place] [phone]

## 2024-02-17 ENCOUNTER — Other Ambulatory Visit: Payer: Self-pay

## 2024-02-17 NOTE — Telephone Encounter (Signed)
 [employer] requested additional info via fax. Form has been completed and faxed back to them for review.

## 2024-02-18 ENCOUNTER — Other Ambulatory Visit: Payer: Self-pay

## 2024-02-18 ENCOUNTER — Other Ambulatory Visit: Payer: Self-pay | Admitting: Pharmacist

## 2024-02-18 NOTE — Progress Notes (Addendum)
 PA approved  Pharmacy Patient Advocate Encounter  Insurance verification completed.   The patient is insured through [employer]   Ran test claim for Otezla . Currently a quantity of 60 is a 30 day supply and the co-pay is $192.  Copay card brings copay to $142  This test claim was processed through [facility]- copay amounts may vary at other pharmacies due to pharmacy/plan contracts, or as the patient moves through the different stages of their insurance plan.

## 2024-02-18 NOTE — Telephone Encounter (Signed)
 Pharmacy Patient Advocate Encounter  Received notification from [employer] that Prior Authorization for Otezla  has been APPROVED from 02/17/24 to 02/15/25   PA #/Case ID/Reference #: [id number]

## 2024-02-18 NOTE — Progress Notes (Signed)
 See OV from 02/14/24 for complete documentation.   Laura Brown, PharmD, JAQUELINE, CPP Clinical Pharmacist [facility] [phone]

## 2024-02-19 ENCOUNTER — Other Ambulatory Visit: Payer: Self-pay

## 2024-02-20 ENCOUNTER — Other Ambulatory Visit: Payer: Self-pay

## 2024-02-20 NOTE — Progress Notes (Signed)
 Specialty Pharmacy Initial Fill Coordination Note  Laura Brown is a 54 y.o. female contacted today regarding initial fill of specialty medication(s) Apremilast  (Otezla )   Patient requested Delivery   Delivery date: 02/25/24   Verified address: [address]   Medication will be filled on: 02/24/24   Patient is aware of $0 copayment. Patient has copay card

## 2024-02-21 ENCOUNTER — Other Ambulatory Visit: Payer: Self-pay

## 2024-02-24 ENCOUNTER — Other Ambulatory Visit: Payer: Self-pay
